# Patient Record
Sex: Female | Born: 1945
Health system: Southern US, Community
[De-identification: ages and names within clinical notes are randomized; demographics above are authoritative.]

## PROBLEM LIST (undated history)

## (undated) DIAGNOSIS — M810 Age-related osteoporosis without current pathological fracture: Secondary | ICD-10-CM

## (undated) DIAGNOSIS — E119 Type 2 diabetes mellitus without complications: Secondary | ICD-10-CM

## (undated) DIAGNOSIS — IMO0001 Reserved for inherently not codable concepts without codable children: Secondary | ICD-10-CM

## (undated) DIAGNOSIS — J209 Acute bronchitis, unspecified: Secondary | ICD-10-CM

## (undated) DIAGNOSIS — E785 Hyperlipidemia, unspecified: Secondary | ICD-10-CM

## (undated) DIAGNOSIS — F419 Anxiety disorder, unspecified: Secondary | ICD-10-CM

## (undated) DIAGNOSIS — R030 Elevated blood-pressure reading, without diagnosis of hypertension: Secondary | ICD-10-CM

## (undated) DIAGNOSIS — R7989 Other specified abnormal findings of blood chemistry: Secondary | ICD-10-CM

## (undated) DIAGNOSIS — M722 Plantar fascial fibromatosis: Secondary | ICD-10-CM

## (undated) DIAGNOSIS — M949 Disorder of cartilage, unspecified: Secondary | ICD-10-CM

## (undated) DIAGNOSIS — M899 Disorder of bone, unspecified: Secondary | ICD-10-CM

## (undated) HISTORY — DX: Disorder of bone, unspecified: M89.9

## (undated) HISTORY — DX: Anxiety disorder, unspecified: F41.9

## (undated) HISTORY — DX: Other specified abnormal findings of blood chemistry: R79.89

## (undated) HISTORY — PX: EYE SURGERY: SHX253

## (undated) HISTORY — DX: Age-related osteoporosis without current pathological fracture: M81.0

## (undated) HISTORY — DX: Disorder of cartilage, unspecified: M94.9

## (undated) HISTORY — DX: Plantar fascial fibromatosis: M72.2

## (undated) HISTORY — DX: Hyperlipidemia, unspecified: E78.5

## (undated) HISTORY — DX: Reserved for inherently not codable concepts without codable children: IMO0001

## (undated) HISTORY — DX: Acute bronchitis, unspecified: J20.9

## (undated) HISTORY — DX: Elevated blood-pressure reading, without diagnosis of hypertension: R03.0

## (undated) HISTORY — DX: Type 2 diabetes mellitus without complications: E11.9

---

## 1995-04-17 HISTORY — PX: BREAST SURGERY: SHX581

## 1999-09-22 ENCOUNTER — Encounter: Payer: Self-pay | Admitting: *Deleted

## 1999-09-22 ENCOUNTER — Encounter: Admission: RE | Admit: 1999-09-22 | Discharge: 1999-09-22 | Payer: Self-pay | Admitting: *Deleted

## 1999-10-26 ENCOUNTER — Other Ambulatory Visit: Admission: RE | Admit: 1999-10-26 | Discharge: 1999-10-26 | Payer: Self-pay | Admitting: *Deleted

## 2000-10-11 ENCOUNTER — Encounter: Payer: Self-pay | Admitting: Obstetrics and Gynecology

## 2000-10-11 ENCOUNTER — Encounter: Admission: RE | Admit: 2000-10-11 | Discharge: 2000-10-11 | Payer: Self-pay | Admitting: Obstetrics and Gynecology

## 2000-11-01 ENCOUNTER — Other Ambulatory Visit: Admission: RE | Admit: 2000-11-01 | Discharge: 2000-11-01 | Payer: Self-pay | Admitting: Obstetrics and Gynecology

## 2001-12-18 ENCOUNTER — Encounter: Admission: RE | Admit: 2001-12-18 | Discharge: 2001-12-18 | Payer: Self-pay | Admitting: *Deleted

## 2001-12-18 ENCOUNTER — Other Ambulatory Visit: Admission: RE | Admit: 2001-12-18 | Discharge: 2001-12-18 | Payer: Self-pay | Admitting: *Deleted

## 2002-12-25 ENCOUNTER — Encounter: Admission: RE | Admit: 2002-12-25 | Discharge: 2002-12-25 | Payer: Self-pay | Admitting: *Deleted

## 2003-02-19 ENCOUNTER — Other Ambulatory Visit: Admission: RE | Admit: 2003-02-19 | Discharge: 2003-02-19 | Payer: Self-pay | Admitting: *Deleted

## 2004-01-13 ENCOUNTER — Ambulatory Visit (HOSPITAL_COMMUNITY): Admission: RE | Admit: 2004-01-13 | Discharge: 2004-01-13 | Payer: Self-pay | Admitting: *Deleted

## 2005-02-01 ENCOUNTER — Ambulatory Visit (HOSPITAL_COMMUNITY): Admission: RE | Admit: 2005-02-01 | Discharge: 2005-02-01 | Payer: Self-pay | Admitting: Family Medicine

## 2006-04-19 ENCOUNTER — Ambulatory Visit (HOSPITAL_COMMUNITY): Admission: RE | Admit: 2006-04-19 | Discharge: 2006-04-19 | Payer: Self-pay | Admitting: *Deleted

## 2007-04-24 ENCOUNTER — Ambulatory Visit (HOSPITAL_COMMUNITY): Admission: RE | Admit: 2007-04-24 | Discharge: 2007-04-24 | Payer: Self-pay | Admitting: Obstetrics and Gynecology

## 2007-12-11 ENCOUNTER — Ambulatory Visit (HOSPITAL_COMMUNITY): Admission: RE | Admit: 2007-12-11 | Discharge: 2007-12-11 | Payer: Self-pay | Admitting: Obstetrics and Gynecology

## 2008-09-16 ENCOUNTER — Emergency Department (HOSPITAL_COMMUNITY): Admission: EM | Admit: 2008-09-16 | Discharge: 2008-09-16 | Payer: Self-pay | Admitting: Emergency Medicine

## 2008-09-19 ENCOUNTER — Emergency Department (HOSPITAL_COMMUNITY): Admission: EM | Admit: 2008-09-19 | Discharge: 2008-09-19 | Payer: Self-pay | Admitting: Emergency Medicine

## 2008-12-23 ENCOUNTER — Ambulatory Visit (HOSPITAL_COMMUNITY): Admission: RE | Admit: 2008-12-23 | Discharge: 2008-12-23 | Payer: Self-pay | Admitting: Internal Medicine

## 2009-09-02 ENCOUNTER — Ambulatory Visit (HOSPITAL_COMMUNITY): Admission: RE | Admit: 2009-09-02 | Discharge: 2009-09-02 | Payer: Self-pay | Admitting: Obstetrics and Gynecology

## 2010-01-20 ENCOUNTER — Ambulatory Visit (HOSPITAL_COMMUNITY): Admission: RE | Admit: 2010-01-20 | Discharge: 2010-01-20 | Payer: Self-pay | Admitting: Obstetrics and Gynecology

## 2010-08-11 ENCOUNTER — Other Ambulatory Visit (HOSPITAL_COMMUNITY): Payer: Self-pay | Admitting: Obstetrics and Gynecology

## 2010-08-11 DIAGNOSIS — Z1231 Encounter for screening mammogram for malignant neoplasm of breast: Secondary | ICD-10-CM

## 2010-09-07 ENCOUNTER — Ambulatory Visit (HOSPITAL_COMMUNITY)
Admission: RE | Admit: 2010-09-07 | Discharge: 2010-09-07 | Disposition: A | Discharge status: Home / Self Care | Payer: BC Managed Care – PPO | Source: Ambulatory Visit | Attending: Obstetrics and Gynecology | Admitting: Obstetrics and Gynecology

## 2010-09-07 DIAGNOSIS — Z1231 Encounter for screening mammogram for malignant neoplasm of breast: Secondary | ICD-10-CM

## 2011-09-03 ENCOUNTER — Other Ambulatory Visit (HOSPITAL_COMMUNITY): Payer: Self-pay | Admitting: Obstetrics and Gynecology

## 2011-09-03 DIAGNOSIS — Z1231 Encounter for screening mammogram for malignant neoplasm of breast: Secondary | ICD-10-CM

## 2011-09-28 ENCOUNTER — Ambulatory Visit (HOSPITAL_COMMUNITY)
Admission: RE | Admit: 2011-09-28 | Discharge: 2011-09-28 | Disposition: A | Discharge status: Home / Self Care | Payer: Medicare Other | Source: Ambulatory Visit | Attending: Obstetrics and Gynecology | Admitting: Obstetrics and Gynecology

## 2011-09-28 DIAGNOSIS — Z1231 Encounter for screening mammogram for malignant neoplasm of breast: Secondary | ICD-10-CM

## 2012-07-24 ENCOUNTER — Ambulatory Visit (INDEPENDENT_AMBULATORY_CARE_PROVIDER_SITE_OTHER): Payer: Medicare Other | Admitting: Nurse Practitioner

## 2012-07-24 ENCOUNTER — Encounter: Payer: Self-pay | Admitting: Geriatric Medicine

## 2012-07-24 ENCOUNTER — Encounter: Payer: Self-pay | Admitting: Nurse Practitioner

## 2012-07-24 VITALS — BP 140/86 | HR 77 | Temp 98.5°F | Resp 16 | Ht 63.0 in | Wt 159.0 lb

## 2012-07-24 DIAGNOSIS — IMO0001 Reserved for inherently not codable concepts without codable children: Secondary | ICD-10-CM

## 2012-07-24 DIAGNOSIS — E119 Type 2 diabetes mellitus without complications: Secondary | ICD-10-CM

## 2012-07-24 DIAGNOSIS — I10 Essential (primary) hypertension: Secondary | ICD-10-CM

## 2012-07-24 DIAGNOSIS — E785 Hyperlipidemia, unspecified: Secondary | ICD-10-CM

## 2012-07-24 DIAGNOSIS — M791 Myalgia, unspecified site: Secondary | ICD-10-CM

## 2012-07-24 MED ORDER — SIMVASTATIN 20 MG PO TABS
20.0000 mg | ORAL_TABLET | Freq: Every evening | ORAL | Status: DC
Start: 2012-07-24 — End: 2013-02-05

## 2012-07-24 MED ORDER — NAPROXEN SODIUM 220 MG PO TABS: 220.0000 mg | ORAL_TABLET | Freq: Two times a day (BID) | ORAL | Status: DC

## 2012-07-24 MED ORDER — METFORMIN HCL 500 MG PO TABS: ORAL_TABLET | ORAL | Status: DC

## 2012-07-24 NOTE — Assessment & Plan Note (Signed)
A1c has improved with diet, exercise and medication. To cont current regimen

## 2012-07-24 NOTE — Assessment & Plan Note (Signed)
Worse after increase exercise. Encouraged good hydration and stretching. She is currently using  biofreeze gel which helps

## 2012-07-24 NOTE — Assessment & Plan Note (Signed)
currently on no medications. Reports sbp is 110s normally when checked at home. Will cont to monitor

## 2012-07-24 NOTE — Assessment & Plan Note (Signed)
Improved on labs- continue current regimen. Will monitor and make changes as necessary.

## 2012-07-24 NOTE — Progress Notes (Signed)
Patient ID: Jasmine James, female   DOB: June 18, 1945, 67 y.o.   MRN: [id number]   Allergies  Allergen Reactions  . Codeine   . Sulfa Antibiotics     Chief Complaint  Patient presents with  . Medical Managment of Chronic Issues    HPI: Patient is a 67 y.o. female seen in the office today for routine follow up   had bone density done less than 1 month ago [facility]- reports it was better than previous  Reports leg cramps/muscle pains after exercise at night  250.00-DM, UNCOMPLICATED TYPE II  hba1c has improved. 272.4-HYPERLIPIDEMIA  lipids improved with statin, diet, exercise- she has cut out soda, bacon, and chips- now she does not even want that 7733.00-OSTEOPOROSIS  on calcium w/ D occassionally and followed by ob-gyn  Review of Systems:  Review of Systems  Constitutional: Negative.   HENT: Negative.   Respiratory: Negative.   Cardiovascular: Negative.   Gastrointestinal: Negative.   Genitourinary: Negative.   Musculoskeletal: Positive for myalgias (at night after she walks).  Skin: Negative.   Neurological: Negative.   Psychiatric/Behavioral: Negative.     Past Medical History  Diagnosis Date  . Acute bronchitis   . Type II or unspecified type diabetes mellitus without mention of complication, not stated as uncontrolled   . Other abnormal blood chemistry   . Plantar fascial fibromatosis   . Myalgia and myositis, unspecified   . Other and unspecified hyperlipidemia   . Osteoporosis, unspecified   . Elevated blood pressure reading without diagnosis of hypertension   . Disorder of bone and cartilage, unspecified    Past Surgical History  Procedure Laterality Date  . Breast surgery  1997    reduction   Social History:   reports that she has been smoking Cigarettes.  She has been smoking about 0.00 packs per day. She does not have any smokeless tobacco history on file. She reports that  drinks alcohol. She reports that she does not use illicit  drugs.  No family history on file.  Medications: Patient's Medications  New Prescriptions   No medications on file  Previous Medications   No medications on file  Modified Medications   Modified Medication Previous Medication   METFORMIN (GLUCOPHAGE) 500 MG TABLET metFORMIN (GLUCOPHAGE) 500 MG tablet      Take one half tablet by mouth every other day with breakfast.    Take 500 mg by mouth 2 (two) times daily with a meal. Take one half tablet by mouth every other day with breakfast.   NAPROXEN SODIUM (ANAPROX) 220 MG TABLET naproxen sodium (ANAPROX) 220 MG tablet      Take 1 tablet (220 mg total) by mouth 2 (two) times daily with a meal. Take one tablet once daily as needed for pain    2 (two) times daily with a meal. Take one tablet once daily as needed for pain   SIMVASTATIN (ZOCOR) 20 MG TABLET simvastatin (ZOCOR) 20 MG tablet      Take 1 tablet (20 mg total) by mouth every evening.    Take 20 mg by mouth every evening.  Discontinued Medications   ASPIRIN 81 MG TABLET    Take 81 mg by mouth daily.     Physical Exam: Physical Exam  Constitutional: She is oriented to person, place, and time. She appears well-developed and well-nourished. No distress.  HENT:  Head: Normocephalic and atraumatic.  Eyes: Conjunctivae and EOM are normal. Pupils are equal, round, and reactive to light.  Neck: Normal range of motion. Neck supple.  Cardiovascular: Normal rate and regular rhythm.   Pulmonary/Chest: Effort normal and breath sounds normal.  Abdominal: Soft. Bowel sounds are normal.  Musculoskeletal: Normal range of motion.  Neurological: She is alert and oriented to person, place, and time.  Skin: Skin is warm and dry. She is not diaphoretic.  Psychiatric: She has a normal mood and affect.     Filed Vitals:   07/24/12 0824  BP: 140/86  Pulse: 77  Temp: 98.5 F (36.9 C)  TempSrc: Oral  Resp: 16  Height: 5\' 3"  (1.6 m)  Weight: 159 lb (72.122 kg)       Assessment/Plan Unspecified essential hypertension currently on no medications. Reports sbp is 110s normally when checked at home. Will cont to monitor  Diabetes A1c has improved with diet, exercise and medication. To cont current regimen   Muscle pain  Worse after increase exercise. Encouraged good hydration and stretching. She is currently using  biofreeze gel which helps   Other and unspecified hyperlipidemia Improved on labs- continue current regimen. Will monitor and make changes as necessary.

## 2013-02-05 ENCOUNTER — Other Ambulatory Visit: Payer: Self-pay | Admitting: *Deleted

## 2013-02-05 DIAGNOSIS — E785 Hyperlipidemia, unspecified: Secondary | ICD-10-CM

## 2013-02-05 MED ORDER — SIMVASTATIN 20 MG PO TABS: 20.0000 mg | ORAL_TABLET | Freq: Every evening | ORAL | Status: DC

## 2013-04-16 HISTORY — PX: CATARACT EXTRACTION: SUR2

## 2013-06-16 ENCOUNTER — Other Ambulatory Visit: Payer: Self-pay | Admitting: *Deleted

## 2013-06-16 DIAGNOSIS — E119 Type 2 diabetes mellitus without complications: Secondary | ICD-10-CM

## 2013-06-16 MED ORDER — METFORMIN HCL 500 MG PO TABS: ORAL_TABLET | ORAL | Status: DC

## 2013-07-29 NOTE — Progress Notes (Signed)
LMOM to return call to schedule an appointment. °

## 2013-09-23 ENCOUNTER — Telehealth: Payer: Self-pay

## 2013-09-23 DIAGNOSIS — E785 Hyperlipidemia, unspecified: Secondary | ICD-10-CM

## 2013-09-23 NOTE — Telephone Encounter (Signed)
Medication refill request came over via manual fax from [facility] for Simvastatin. Patient last seen greater than 1 year ago, ? If patient intends on continuing care with Janett Billow (NP), if yes patient needs to schedule an appointment and medication can be filled for 1 month

## 2013-09-25 MED ORDER — SIMVASTATIN 20 MG PO TABS: 20.0000 mg | ORAL_TABLET | Freq: Every evening | ORAL | Status: DC

## 2013-09-25 NOTE — Telephone Encounter (Signed)
Patient called back indicating she will keep pending appointment for 10/2013.

## 2013-10-22 ENCOUNTER — Encounter: Payer: Self-pay | Admitting: Nurse Practitioner

## 2013-10-22 ENCOUNTER — Ambulatory Visit (INDEPENDENT_AMBULATORY_CARE_PROVIDER_SITE_OTHER): Payer: Medicare Other | Admitting: Nurse Practitioner

## 2013-10-22 VITALS — BP 160/90 | HR 80 | Temp 98.1°F | Wt 157.0 lb

## 2013-10-22 DIAGNOSIS — M8580 Other specified disorders of bone density and structure, unspecified site: Secondary | ICD-10-CM

## 2013-10-22 DIAGNOSIS — E119 Type 2 diabetes mellitus without complications: Secondary | ICD-10-CM

## 2013-10-22 DIAGNOSIS — Z23 Encounter for immunization: Secondary | ICD-10-CM

## 2013-10-22 DIAGNOSIS — E785 Hyperlipidemia, unspecified: Secondary | ICD-10-CM

## 2013-10-22 DIAGNOSIS — I10 Essential (primary) hypertension: Secondary | ICD-10-CM

## 2013-10-22 DIAGNOSIS — M949 Disorder of cartilage, unspecified: Secondary | ICD-10-CM

## 2013-10-22 DIAGNOSIS — M899 Disorder of bone, unspecified: Secondary | ICD-10-CM

## 2013-10-22 NOTE — Patient Instructions (Signed)
To come in tomorrow morning fasting for lab work, after this results we will send in your prescriptions  Health Maintenance, Female A healthy lifestyle and preventative care can promote health and wellness.  Maintain regular health, dental, and eye exams.  Eat a healthy diet. Foods like vegetables, fruits, whole grains, low-fat dairy products, and lean protein foods contain the nutrients you need without too many calories. Decrease your intake of foods high in solid fats, added sugars, and salt. Get information about a proper diet from your caregiver, if necessary.  Regular physical exercise is one of the most important things you can do for your health. Most adults should get at least 150 minutes of moderate-intensity exercise (any activity that increases your heart rate and causes you to sweat) each week. In addition, most adults need muscle-strengthening exercises on 2 or more days a week.   Maintain a healthy weight. The body mass index (BMI) is a screening tool to identify possible weight problems. It provides an estimate of body fat based on height and weight. Your caregiver can help determine your BMI, and can help you achieve or maintain a healthy weight. For adults 20 years and older:  A BMI below 18.5 is considered underweight.  A BMI of 18.5 to 24.9 is normal.  A BMI of 25 to 29.9 is considered overweight.  A BMI of 30 and above is considered obese.  Maintain normal blood lipids and cholesterol by exercising and minimizing your intake of saturated fat. Eat a balanced diet with plenty of fruits and vegetables. Blood tests for lipids and cholesterol should begin at age 53 and be repeated every 5 years. If your lipid or cholesterol levels are high, you are over 50, or you are a high risk for heart disease, you may need your cholesterol levels checked more frequently.Ongoing high lipid and cholesterol levels should be treated with medicines if diet and exercise are not effective.  If  you smoke, find out from your caregiver how to quit. If you do not use tobacco, do not start.  Lung cancer screening is recommended for adults aged 70-80 years who are at high risk for developing lung cancer because of a history of smoking. Yearly low-dose computed tomography (CT) is recommended for people who have at least a 30-pack-year history of smoking and are a current smoker or have quit within the past 15 years. A pack year of smoking is smoking an average of 1 pack of cigarettes a day for 1 year (for example: 1 pack a day for 30 years or 2 packs a day for 15 years). Yearly screening should continue until the smoker has stopped smoking for at least 15 years. Yearly screening should also be stopped for people who develop a health problem that would prevent them from having lung cancer treatment.  If you are pregnant, do not drink alcohol. If you are breastfeeding, be very cautious about drinking alcohol. If you are not pregnant and choose to drink alcohol, do not exceed 1 drink per day. One drink is considered to be 12 ounces (355 mL) of beer, 5 ounces (148 mL) of wine, or 1.5 ounces (44 mL) of liquor.  Avoid use of street drugs. Do not share needles with anyone. Ask for help if you need support or instructions about stopping the use of drugs.  High blood pressure causes heart disease and increases the risk of stroke. Blood pressure should be checked at least every 1 to 2 years. Ongoing high blood pressure should  be treated with medicines, if weight loss and exercise are not effective.  If you are 83 to 68 years old, ask your caregiver if you should take aspirin to prevent strokes.  Diabetes screening involves taking a blood sample to check your fasting blood sugar level. This should be done once every 3 years, after age 20, if you are within normal weight and without risk factors for diabetes. Testing should be considered at a younger age or be carried out more frequently if you are overweight  and have at least 1 risk factor for diabetes.  Breast cancer screening is essential preventative care for women. You should practice "breast self-awareness." This means understanding the normal appearance and feel of your breasts and may include breast self-examination. Any changes detected, no matter how small, should be reported to a caregiver. Women in their 1s and 30s should have a clinical breast exam (CBE) by a caregiver as part of a regular health exam every 1 to 3 years. After age 13, women should have a CBE every year. Starting at age 90, women should consider having a mammogram (breast X-ray) every year. Women who have a family history of breast cancer should talk to their caregiver about genetic screening. Women at a high risk of breast cancer should talk to their caregiver about having an MRI and a mammogram every year.  Breast cancer gene (BRCA)-related cancer risk assessment is recommended for women who have family members with BRCA-related cancers. BRCA-related cancers include breast, ovarian, tubal, and peritoneal cancers. Having family members with these cancers may be associated with an increased risk for harmful changes (mutations) in the breast cancer genes BRCA1 and BRCA2. Results of the assessment will determine the need for genetic counseling and BRCA1 and BRCA2 testing.  The Pap test is a screening test for cervical cancer. Women should have a Pap test starting at age 48. Between ages 35 and 55, Pap tests should be repeated every 2 years. Beginning at age 7, you should have a Pap test every 3 years as long as the past 3 Pap tests have been normal. If you had a hysterectomy for a problem that was not cancer or a condition that could lead to cancer, then you no longer need Pap tests. If you are between ages 4 and 85, and you have had normal Pap tests going back 10 years, you no longer need Pap tests. If you have had past treatment for cervical cancer or a condition that could lead to  cancer, you need Pap tests and screening for cancer for at least 20 years after your treatment. If Pap tests have been discontinued, risk factors (such as a new sexual partner) need to be reassessed to determine if screening should be resumed. Some women have medical problems that increase the chance of getting cervical cancer. In these cases, your caregiver may recommend more frequent screening and Pap tests.  The human papillomavirus (HPV) test is an additional test that may be used for cervical cancer screening. The HPV test looks for the virus that can cause the cell changes on the cervix. The cells collected during the Pap test can be tested for HPV. The HPV test could be used to screen women aged 73 years and older, and should be used in women of any age who have unclear Pap test results. After the age of 73, women should have HPV testing at the same frequency as a Pap test.  Colorectal cancer can be detected and often prevented. Most  routine colorectal cancer screening begins at the age of 35 and continues through age 58. However, your caregiver may recommend screening at an earlier age if you have risk factors for colon cancer. On a yearly basis, your caregiver may provide home test kits to check for hidden blood in the stool. Use of a small camera at the end of a tube, to directly examine the colon (sigmoidoscopy or colonoscopy), can detect the earliest forms of colorectal cancer. Talk to your caregiver about this at age 6, when routine screening begins. Direct examination of the colon should be repeated every 5 to 10 years through age 67, unless early forms of pre-cancerous polyps or small growths are found.  Hepatitis C blood testing is recommended for all people born from [birth year] through 1965 and any individual with known risks for hepatitis C.  Practice safe sex. Use condoms and avoid high-risk sexual practices to reduce the spread of sexually transmitted infections (STIs). Sexually active women  aged 62 and younger should be checked for Chlamydia, which is a common sexually transmitted infection. Older women with new or multiple partners should also be tested for Chlamydia. Testing for other STIs is recommended if you are sexually active and at increased risk.  Osteoporosis is a disease in which the bones lose minerals and strength with aging. This can result in serious bone fractures. The risk of osteoporosis can be identified using a bone density scan. Women ages 26 and over and women at risk for fractures or osteoporosis should discuss screening with their caregivers. Ask your caregiver whether you should be taking a calcium supplement or vitamin D to reduce the rate of osteoporosis.  Menopause can be associated with physical symptoms and risks. Hormone replacement therapy is available to decrease symptoms and risks. You should talk to your caregiver about whether hormone replacement therapy is right for you.  Use sunscreen. Apply sunscreen liberally and repeatedly throughout the day. You should seek shade when your shadow is shorter than you. Protect yourself by wearing long sleeves, pants, a wide-brimmed hat, and sunglasses year round, whenever you are outdoors.  Notify your caregiver of new moles or changes in moles, especially if there is a change in shape or color. Also notify your caregiver if a mole is larger than the size of a pencil eraser.  Stay current with your immunizations. Document Released: 10/16/2010 Document Revised: 07/28/2012 Document Reviewed: 03/04/2013 [facility] Patient Information 2015 [place], [place]. This information is not intended to replace advice given to you by your health care provider. Make sure you discuss any questions you have with your health care provider.

## 2013-10-22 NOTE — Progress Notes (Signed)
Patient ID: Jasmine James, female   DOB: 11-01-1945, 68 y.o.   MRN: [id number]    Allergies  Allergen Reactions  . Codeine   . Sulfa Antibiotics     Chief Complaint  Patient presents with  . Medical Management of Chronic Issues    f/u & medication refills  . Immunizations    declines Pneumo & shingles vaccines  . other    colonoscopy done 2008    HPI: Patient is a 68 y.o. female seen in the office today for routine follow up Checked blood pressures at home occasionally reported in was 134/74, 120s/80s at home  Does not take blood sugar, takes 1 tablet daily, Rx is for 1/2 tablet  Simvastatin 20 mg daily tolerating well  Reports she is walking 2 hours every Sunday and 1 mile every Thursday and Friday; also dances  eating small portions   Had bone density in the last year P[facility] done by [facility] and GYN reviewed bone density reported osteopenia, on calicum and vit d for this. No complaints at this time  Review of Systems:  Review of Systems  Constitutional: Negative.  Negative for weight loss and malaise/fatigue.  HENT: Negative.   Eyes: Negative for blurred vision.  Respiratory: Negative.  Negative for cough and shortness of breath.   Cardiovascular: Negative.  Negative for chest pain and leg swelling.  Gastrointestinal: Negative.  Negative for heartburn, nausea, vomiting, abdominal pain, diarrhea and constipation.  Genitourinary: Negative.  Negative for dysuria, urgency and frequency.  Musculoskeletal: Negative.  Negative for joint pain and myalgias.  Skin: Negative.  Negative for itching and rash.  Neurological: Negative.  Negative for dizziness, sensory change, weakness and headaches.  Psychiatric/Behavioral: Negative.  Negative for depression. The patient is not nervous/anxious and does not have insomnia.      Past Medical History  Diagnosis Date  . Acute bronchitis   . Type II or unspecified type diabetes mellitus without mention of complication, not  stated as uncontrolled   . Other abnormal blood chemistry   . Plantar fascial fibromatosis   . Myalgia and myositis, unspecified   . Other and unspecified hyperlipidemia   . Osteoporosis, unspecified   . Elevated blood pressure reading without diagnosis of hypertension   . Disorder of bone and cartilage, unspecified    Past Surgical History  Procedure Laterality Date  . Breast surgery  1997    reduction   Social History:   reports that she has never smoked. She has never used smokeless tobacco. She reports that she drinks alcohol. She reports that she does not use illicit drugs.  History reviewed. No pertinent family history.  Medications: Patient's Medications  New Prescriptions   No medications on file  Previous Medications   METFORMIN (GLUCOPHAGE) 500 MG TABLET    Take one half tablet by mouth every other day with breakfast.   SIMVASTATIN (ZOCOR) 20 MG TABLET    Take 1 tablet (20 mg total) by mouth every evening.  Modified Medications   No medications on file  Discontinued Medications   NAPROXEN SODIUM (ANAPROX) 220 MG TABLET    Take 1 tablet (220 mg total) by mouth 2 (two) times daily with a meal. Take one tablet once daily as needed for pain   SIMVASTATIN (ZOCOR) 20 MG TABLET    Take 1 tablet (20 mg total) by mouth every evening.     Physical Exam:  Filed Vitals:   10/22/13 0859  BP: 160/90  Pulse: 80  Temp: 98.1  F (36.7 C)  TempSrc: Oral  Weight: 157 lb (71.215 kg)  SpO2: 98%    Physical Exam  Constitutional: She is oriented to person, place, and time and well-developed, well-nourished, and in no distress.  HENT:  Mouth/Throat: Oropharynx is clear and moist. No oropharyngeal exudate.  Eyes: Conjunctivae and EOM are normal. Pupils are equal, round, and reactive to light.  Neck: Normal range of motion. Neck supple. No thyromegaly present.  Cardiovascular: Normal rate, regular rhythm, normal heart sounds and intact distal pulses.   Pulmonary/Chest: Effort  normal and breath sounds normal. No respiratory distress.  Abdominal: Soft. Bowel sounds are normal. She exhibits no distension.  Musculoskeletal: Normal range of motion. She exhibits no edema and no tenderness.  Lymphadenopathy:    She has no cervical adenopathy.  Neurological: She is alert and oriented to person, place, and time.  Skin: Skin is warm and dry.  Psychiatric: Affect normal.    Labs reviewed: Basic Metabolic Panel: No results found for this basename: NA, K, CL, CO2, GLUCOSE, BUN, CREATININE, CALCIUM, MG, PHOS, TSH,  in the last 8760 hours Liver Function Tests: No results found for this basename: AST, ALT, ALKPHOS, BILITOT, PROT, ALBUMIN,  in the last 8760 hours No results found for this basename: LIPASE, AMYLASE,  in the last 8760 hours No results found for this basename: AMMONIA,  in the last 8760 hours CBC: No results found for this basename: WBC, NEUTROABS, HGB, HCT, MCV, PLT,  in the last 8760 hours Lipid Panel: No results found for this basename: CHOL, HDL, LDLCALC, TRIG, CHOLHDL, LDLDIRECT,  in the last 8760 hours TSH: No results found for this basename: TSH,  in the last 8760 hours A1C: No results found for this basename: HGBA1C     Assessment/Plan  1. Other and unspecified hyperlipidemia -cont current medications and lifestyle modification  - Lipid panel  2. Type 2 diabetes mellitus without complication - Comprehensive metabolic panel - CBC With differential/Platelet - Hemoglobin A1c - Microalbumin, urine -cont Metformin 1 tablet daily for diabetes, may need to change based on A1c -consider starting ACE for kidney protection and hypertension, will get labs before   3. Unspecified essential hypertension -to follow up blood pressure at home, follow up in 2 weeks for blood pressure check  4. Need for prophylactic vaccination against Streptococcus pneumoniae (pneumococcus) - Pneumococcal conjugate vaccine 13-valent given today  5. Osteopenia - cont  calcium and vit D daily  -bone density scans per GYN

## 2013-10-23 ENCOUNTER — Other Ambulatory Visit: Payer: Medicare Other

## 2013-10-23 LAB — MICROALBUMIN, URINE: MICROALBUM., U, RANDOM: 8.8 ug/mL (ref 0.0–17.0)

## 2013-10-24 LAB — COMPREHENSIVE METABOLIC PANEL
ALT: 28 IU/L (ref 0–32)
AST: 26 IU/L (ref 0–40)
Albumin/Globulin Ratio: 1.9 (ref 1.1–2.5)
Albumin: 4.6 g/dL (ref 3.6–4.8)
Alkaline Phosphatase: 81 IU/L (ref 39–117)
BUN/Creatinine Ratio: 29 — ABNORMAL HIGH (ref 11–26)
BUN: 22 mg/dL (ref 8–27)
CALCIUM: 9.4 mg/dL (ref 8.7–10.3)
CO2: 21 mmol/L (ref 18–29)
Chloride: 104 mmol/L (ref 97–108)
Creatinine, Ser: 0.76 mg/dL (ref 0.57–1.00)
GFR calc Af Amer: 94 mL/min/{1.73_m2} (ref >59)
GFR, EST NON AFRICAN AMERICAN: 81 mL/min/{1.73_m2} (ref >59)
GLOBULIN, TOTAL: 2.4 g/dL (ref 1.5–4.5)
Glucose: 102 mg/dL — ABNORMAL HIGH (ref 65–99)
POTASSIUM: 4.5 mmol/L (ref 3.5–5.2)
Sodium: 141 mmol/L (ref 134–144)
TOTAL PROTEIN: 7 g/dL (ref 6.0–8.5)
Total Bilirubin: 0.4 mg/dL (ref 0.0–1.2)

## 2013-10-24 LAB — LIPID PANEL
CHOL/HDL RATIO: 3.3 ratio (ref 0.0–4.4)
CHOLESTEROL TOTAL: 140 mg/dL (ref 100–199)
HDL: 42 mg/dL (ref >39)
LDL Calculated: 82 mg/dL (ref 0–99)
TRIGLYCERIDES: 78 mg/dL (ref 0–149)
VLDL CHOLESTEROL CAL: 16 mg/dL (ref 5–40)

## 2013-10-24 LAB — CBC WITH DIFFERENTIAL
Basophils Absolute: 0 10*3/uL (ref 0.0–0.2)
Basos: 1 %
EOS ABS: 0.1 10*3/uL (ref 0.0–0.4)
EOS: 2 %
HEMATOCRIT: 40.7 % (ref 34.0–46.6)
HEMOGLOBIN: 13.3 g/dL (ref 11.1–15.9)
IMMATURE GRANULOCYTES: 0 %
Immature Grans (Abs): 0 10*3/uL (ref 0.0–0.1)
Lymphocytes Absolute: 3.1 10*3/uL (ref 0.7–3.1)
Lymphs: 43 %
MCH: 28.7 pg (ref 26.6–33.0)
MCHC: 32.7 g/dL (ref 31.5–35.7)
MCV: 88 fL (ref 79–97)
MONOCYTES: 10 %
Monocytes Absolute: 0.7 10*3/uL (ref 0.1–0.9)
Neutrophils Absolute: 3.2 10*3/uL (ref 1.4–7.0)
Neutrophils Relative %: 44 %
Platelets: 274 10*3/uL (ref 150–379)
RBC: 4.63 x10E6/uL (ref 3.77–5.28)
RDW: 14.2 % (ref 12.3–15.4)
WBC: 7.2 10*3/uL (ref 3.4–10.8)

## 2013-10-24 LAB — HEMOGLOBIN A1C
Est. average glucose Bld gHb Est-mCnc: 123 mg/dL
HEMOGLOBIN A1C: 5.9 % — AB (ref 4.8–5.6)

## 2013-10-25 DIAGNOSIS — M8580 Other specified disorders of bone density and structure, unspecified site: Secondary | ICD-10-CM | POA: Insufficient documentation

## 2013-10-25 MED ORDER — METFORMIN HCL 500 MG PO TABS: 500.0000 mg | ORAL_TABLET | Freq: Every day | ORAL | Status: DC

## 2013-10-26 ENCOUNTER — Other Ambulatory Visit: Payer: Self-pay | Admitting: *Deleted

## 2013-10-26 DIAGNOSIS — E785 Hyperlipidemia, unspecified: Secondary | ICD-10-CM

## 2013-10-26 DIAGNOSIS — E119 Type 2 diabetes mellitus without complications: Secondary | ICD-10-CM

## 2013-10-26 MED ORDER — SIMVASTATIN 20 MG PO TABS: 20.0000 mg | ORAL_TABLET | Freq: Every evening | ORAL | Status: DC

## 2013-10-26 MED ORDER — SIMVASTATIN 20 MG PO TABS: ORAL_TABLET | ORAL | Status: DC

## 2013-10-26 MED ORDER — METFORMIN HCL 500 MG PO TABS: 500.0000 mg | ORAL_TABLET | Freq: Every day | ORAL | Status: DC

## 2013-10-26 NOTE — Telephone Encounter (Signed)
rx sent to the pharmacy due to refills not going on appt day.

## 2013-11-05 ENCOUNTER — Encounter: Payer: Self-pay | Admitting: Nurse Practitioner

## 2013-11-05 ENCOUNTER — Ambulatory Visit (INDEPENDENT_AMBULATORY_CARE_PROVIDER_SITE_OTHER): Payer: Medicare Other | Admitting: Nurse Practitioner

## 2013-11-05 VITALS — BP 138/88 | HR 78 | Temp 98.4°F | Resp 18 | Ht 63.0 in | Wt 158.0 lb

## 2013-11-05 DIAGNOSIS — I10 Essential (primary) hypertension: Secondary | ICD-10-CM

## 2013-11-05 DIAGNOSIS — E119 Type 2 diabetes mellitus without complications: Secondary | ICD-10-CM

## 2013-11-05 MED ORDER — LISINOPRIL 5 MG PO TABS: 5.0000 mg | ORAL_TABLET | Freq: Every day | ORAL | Status: DC

## 2013-11-05 NOTE — Patient Instructions (Addendum)
Will start on lisinopril to protect your kidneys Goal bp is <140/90  Lisinopril tablets What is this medicine? LISINOPRIL (lyse IN [place] pril) is an ACE inhibitor. This medicine is used to treat high blood pressure and heart failure. It is also used to protect the heart immediately after a heart attack. This medicine may be used for other purposes; ask your health care provider or pharmacist if you have questions. COMMON BRAND NAME(S): Prinivil, Zestril What should I tell my health care provider before I take this medicine? They need to know if you have any of these conditions: -diabetes -heart or blood vessel disease -immune system disease like lupus or scleroderma -kidney disease -low blood pressure -previous swelling of the tongue, face, or lips with difficulty breathing, difficulty swallowing, hoarseness, or tightening of the throat -an unusual or allergic reaction to lisinopril, other ACE inhibitors, insect venom, foods, dyes, or preservatives -pregnant or trying to get pregnant -breast-feeding How should I use this medicine? Take this medicine by mouth with a glass of water. Follow the directions on your prescription label. You may take this medicine with or without food. Take your medicine at regular intervals. Do not stop taking this medicine except on the advice of your doctor or health care professional. Talk to your pediatrician regarding the use of this medicine in children. Special care may be needed. While this drug may be prescribed for children as young as 47 years of age for selected conditions, precautions do apply. Overdosage: If you think you have taken too much of this medicine contact a poison control center or emergency room at once. NOTE: This medicine is only for you. Do not share this medicine with others. What if I miss a dose? If you miss a dose, take it as soon as you can. If it is almost time for your next dose, take only that dose. Do not take double or extra  doses. What may interact with this medicine? -diuretics -lithium -NSAIDs, medicines for pain and inflammation, like ibuprofen or naproxen -over-the-counter herbal supplements like hawthorn -potassium salts or potassium supplements -salt substitutes This list may not describe all possible interactions. Give your health care provider a list of all the medicines, herbs, non-prescription drugs, or dietary supplements you use. Also tell them if you smoke, drink alcohol, or use illegal drugs. Some items may interact with your medicine. What should I watch for while using this medicine? Visit your doctor or health care professional for regular check ups. Check your blood pressure as directed. Ask your doctor what your blood pressure should be, and when you should contact him or her. Call your doctor or health care professional if you notice an irregular or fast heart beat. Women should inform their doctor if they wish to become pregnant or think they might be pregnant. There is a potential for serious side effects to an unborn child. Talk to your health care professional or pharmacist for more information. Check with your doctor or health care professional if you get an attack of severe diarrhea, nausea and vomiting, or if you sweat a lot. The loss of too much body fluid can make it dangerous for you to take this medicine. You may get drowsy or dizzy. Do not drive, use machinery, or do anything that needs mental alertness until you know how this drug affects you. Do not stand or sit up quickly, especially if you are an older patient. This reduces the risk of dizzy or fainting spells. Alcohol can make you more  drowsy and dizzy. Avoid alcoholic drinks. Avoid salt substitutes unless you are told otherwise by your doctor or health care professional. Do not treat yourself for coughs, colds, or pain while you are taking this medicine without asking your doctor or health care professional for advice. Some  ingredients may increase your blood pressure. What side effects may I notice from receiving this medicine? Side effects that you should report to your doctor or health care professional as soon as possible: -abdominal pain with or without nausea or vomiting -allergic reactions like skin rash or hives, swelling of the hands, feet, face, lips, throat, or tongue -dark urine -difficulty breathing -dizzy, lightheaded or fainting spell -fever or sore throat -irregular heart beat, chest pain -pain or difficulty passing urine -redness, blistering, peeling or loosening of the skin, including inside the mouth -unusually weak -yellowing of the eyes or skin Side effects that usually do not require medical attention (report to your doctor or health care professional if they continue or are bothersome): -change in taste -cough -decreased sexual function or desire -headache -sun sensitivity -tiredness This list may not describe all possible side effects. Call your doctor for medical advice about side effects. You may report side effects to FDA at 1-800-FDA-1088. Where should I keep my medicine? Keep out of the reach of children. Store at room temperature between 15 and 30 degrees C (59 and 86 degrees F). Protect from moisture. Keep container tightly closed. Throw away any unused medicine after the expiration date. NOTE: This sheet is a summary. It may not cover all possible information. If you have questions about this medicine, talk to your doctor, pharmacist, or health care provider.  2015, Elsevier/[employer] Standard. (2007-10-06 17:36:32)

## 2013-11-09 NOTE — Progress Notes (Signed)
Patient ID: Jasmine James, female   DOB: Oct 14, 1945, 68 y.o.   MRN: [id number]    Allergies  Allergen Reactions  . Codeine   . Sulfa Antibiotics     Chief Complaint  Patient presents with  . Follow-up    BP check    HPI: Patient is a 68 y.o. female seen in the office today for blood pressure review  Blood pressure from home log range from 116-138/66-85  Review of Systems:  Review of Systems  Constitutional: Negative for weight loss.  Eyes: Negative for blurred vision.  Respiratory: Negative for cough and shortness of breath.   Cardiovascular: Negative for chest pain and leg swelling.  Gastrointestinal: Negative.  Negative for heartburn, diarrhea and constipation.  Neurological: Negative for tingling, sensory change, weakness and headaches.  Endo/Heme/Allergies: Negative for polydipsia.     Past Medical History  Diagnosis Date  . Acute bronchitis   . Type II or unspecified type diabetes mellitus without mention of complication, not stated as uncontrolled   . Other abnormal blood chemistry   . Plantar fascial fibromatosis   . Myalgia and myositis, unspecified   . Other and unspecified hyperlipidemia   . Osteoporosis, unspecified   . Elevated blood pressure reading without diagnosis of hypertension   . Disorder of bone and cartilage, unspecified    Past Surgical History  Procedure Laterality Date  . Breast surgery  1997    reduction   Social History:   reports that she has never smoked. She has never used smokeless tobacco. She reports that she drinks alcohol. She reports that she does not use illicit drugs.  No family history on file.  Medications: Patient's Medications  New Prescriptions   LISINOPRIL (PRINIVIL,ZESTRIL) 5 MG TABLET    Take 1 tablet (5 mg total) by mouth daily.  Previous Medications   METFORMIN (GLUCOPHAGE) 500 MG TABLET    Take 1 tablet (500 mg total) by mouth daily with breakfast.   SIMVASTATIN (ZOCOR) 20 MG TABLET    Take one tablet by mouth  every evening to lower cholesterol  Modified Medications   No medications on file  Discontinued Medications   No medications on file     Physical Exam:  Filed Vitals:   11/05/13 1000 11/05/13 1011  BP: 160/110 138/88  Pulse: 78   Temp: 98.4 F (36.9 C)   TempSrc: Oral   Resp: 18   Height: 5\' 3"  (1.6 m)   Weight: 158 lb (71.668 kg)   SpO2: 97%     Physical Exam  Constitutional: She is oriented to person, place, and time and well-developed, well-nourished, and in no distress.  Cardiovascular: Normal rate, regular rhythm, normal heart sounds and intact distal pulses.   Pulmonary/Chest: Effort normal and breath sounds normal. No respiratory distress.  Abdominal: Soft. Bowel sounds are normal.  Neurological: She is alert and oriented to person, place, and time.  Skin: Skin is warm and dry.  Psychiatric: Affect normal.    Labs reviewed: Basic Metabolic Panel:  Recent Labs  10/23/13 0806  NA 141  K 4.5  CL 104  CO2 21  GLUCOSE 102*  BUN 22  CREATININE 0.76  CALCIUM 9.4   Liver Function Tests:  Recent Labs  10/23/13 0806  AST 26  ALT 28  ALKPHOS 81  BILITOT 0.4  PROT 7.0   No results found for this basename: LIPASE, AMYLASE,  in the last 8760 hours No results found for this basename: AMMONIA,  in the last 8760 hours CBC:  Recent Labs  10/23/13 0806  WBC 7.2  NEUTROABS 3.2  HGB 13.3  HCT 40.7  MCV 88  PLT 274   Lipid Panel:  Recent Labs  10/23/13 0806  HDL 42  LDLCALC 82  TRIG 78  CHOLHDL 3.3   TSH: No results found for this basename: TSH,  in the last 8760 hours A1C: Lab Results  Component Value Date   HGBA1C 5.9* 10/23/2013     Assessment/Plan  1. Type 2 diabetes mellitus without complication -will add ACE inhibitor for renal protection - lisinopril (PRINIVIL,ZESTRIL) 5 MG tablet; Take 1 tablet (5 mg total) by mouth daily.  Dispense: 30 tablet; Refill: 6 discussed risk vs benefits of lisinopril, pt very reluctant to take any  medications, husband does not support taking medications -information also provided -to call and inform us of any side effects or if not able to tolerate - Basic metabolic panel; Future  2. Unspecified essential hypertension - stable at this time, will add lisinopril for diabetic benefit -blood pressure will tolerate dose -goal for blood pressure to be less than 140/90

## 2013-12-24 LAB — HM DIABETES EYE EXAM

## 2014-02-04 ENCOUNTER — Other Ambulatory Visit: Payer: Medicare Other

## 2014-02-04 DIAGNOSIS — E119 Type 2 diabetes mellitus without complications: Secondary | ICD-10-CM

## 2014-02-05 LAB — BASIC METABOLIC PANEL
BUN/Creatinine Ratio: 23 (ref 11–26)
BUN: 18 mg/dL (ref 8–27)
CALCIUM: 9.7 mg/dL (ref 8.7–10.3)
CO2: 23 mmol/L (ref 18–29)
CREATININE: 0.8 mg/dL (ref 0.57–1.00)
Chloride: 102 mmol/L (ref 97–108)
GFR calc Af Amer: 88 mL/min/{1.73_m2} (ref >59)
GFR calc non Af Amer: 77 mL/min/{1.73_m2} (ref >59)
Glucose: 102 mg/dL — ABNORMAL HIGH (ref 65–99)
Potassium: 4.1 mmol/L (ref 3.5–5.2)
Sodium: 141 mmol/L (ref 134–144)

## 2014-03-03 ENCOUNTER — Ambulatory Visit: Payer: Medicare Other | Admitting: Nurse Practitioner

## 2014-03-04 ENCOUNTER — Encounter: Payer: Self-pay | Admitting: Nurse Practitioner

## 2014-03-04 ENCOUNTER — Ambulatory Visit (INDEPENDENT_AMBULATORY_CARE_PROVIDER_SITE_OTHER): Payer: Medicare Other | Admitting: Nurse Practitioner

## 2014-03-04 VITALS — BP 120/80 | HR 74 | Temp 98.3°F | Ht 63.0 in | Wt 153.2 lb

## 2014-03-04 DIAGNOSIS — T300 Burn of unspecified body region, unspecified degree: Secondary | ICD-10-CM

## 2014-03-04 MED ORDER — MUPIROCIN 2 % EX OINT: 1.0000 "application " | TOPICAL_OINTMENT | Freq: Two times a day (BID) | CUTANEOUS | Status: DC

## 2014-03-04 NOTE — Progress Notes (Signed)
Patient ID: Jasmine James, female   DOB: [birth year], 68 y.o.   MRN: [id number]    PCP: [name], NP  Allergies  Allergen Reactions  . Codeine   . Sulfa Antibiotics     Chief Complaint  Patient presents with  . Acute Visit    Pt. Has burn on arm from oven X 13 days, infected     HPI: Patient is a 68 y.o. female seen in the office today to evaluation of burn on right arm. Pt went to get some toast out the oven and burned the posterior aspect of her right arm near wrist. Has not healed yet and concerned about infection. Has been using different creams and treatments to help heal it. Now scabbed over. No drainage noted. Slightly tender and puffy around area.    Review of Systems:  Review of Systems  Skin: Positive for wound (from burn, see HPI).  All other systems reviewed and are negative.   Past Medical History  Diagnosis Date  . Acute bronchitis   . Type II or unspecified type diabetes mellitus without mention of complication, not stated as uncontrolled   . Other abnormal blood chemistry   . Plantar fascial fibromatosis   . Myalgia and myositis, unspecified   . Other and unspecified hyperlipidemia   . Osteoporosis, unspecified   . Elevated blood pressure reading without diagnosis of hypertension   . Disorder of bone and cartilage, unspecified    Past Surgical History  Procedure Laterality Date  . Breast surgery  1997    reduction   Social History:   reports that she has never smoked. She has never used smokeless tobacco. She reports that she drinks alcohol. She reports that she does not use illicit drugs.  History reviewed. No pertinent family history.  Medications: Patient's Medications  New Prescriptions   No medications on file  Previous Medications   LISINOPRIL (PRINIVIL,ZESTRIL) 5 MG TABLET    Take 1 tablet (5 mg total) by mouth daily.   METFORMIN (GLUCOPHAGE) 500 MG TABLET    Take 1 tablet (500 mg total) by mouth daily with breakfast.   SIMVASTATIN (ZOCOR) 20 MG TABLET    Take one tablet by mouth every evening to lower cholesterol  Modified Medications   No medications on file  Discontinued Medications   No medications on file     Physical Exam:  Filed Vitals:   03/04/14 1423  BP: 120/80  Pulse: 74  Temp: 99.5 F (37.5 C)  TempSrc: Oral  Height: 5\' 3"  (1.6 m)  Weight: 153 lb 3.2 oz (69.491 kg)  SpO2: 99%    Physical Exam  Constitutional: She is oriented to person, place, and time. She appears well-developed and well-nourished. No distress.  HENT:  Head: Normocephalic and atraumatic.  Eyes: Conjunctivae and EOM are normal. Pupils are equal, round, and reactive to light.  Neck: Normal range of motion. Neck supple.  Cardiovascular: Normal rate and regular rhythm.   Pulmonary/Chest: Effort normal and breath sounds normal.  Abdominal: Soft. Bowel sounds are normal.  Musculoskeletal: Normal range of motion.  Neurological: She is alert and oriented to person, place, and time.  Skin: Skin is warm and dry. She is not diaphoretic. There is erythema (slight erythema surrounding burn, area scabbed over, no drainage noted, no heat area measuring 3 x 2 cm).  Psychiatric: She has a normal mood and affect.    Labs reviewed: Basic Metabolic Panel:  Recent Labs  10/23/13 0806 02/04/14 0830  NA 141  141  K 4.5 4.1  CL 104 102  CO2 21 23  GLUCOSE 102* 102*  BUN 22 18  CREATININE 0.76 0.80  CALCIUM 9.4 9.7   Liver Function Tests:  Recent Labs  10/23/13 0806  AST 26  ALT 28  ALKPHOS 81  BILITOT 0.4  PROT 7.0   No results for input(s): LIPASE, AMYLASE in the last 8760 hours. No results for input(s): AMMONIA in the last 8760 hours. CBC:  Recent Labs  10/23/13 0806  WBC 7.2  NEUTROABS 3.2  HGB 13.3  HCT 40.7  MCV 88  PLT 274   Lipid Panel:  Recent Labs  10/23/13 0806  HDL 42  LDLCALC 82  TRIG 78  CHOLHDL 3.3   TSH: No results for input(s): TSH in the last 8760 hours. A1C: Lab Results    Component Value Date   HGBA1C 5.9* 10/23/2013     Assessment/Plan  1. Burn -healing without acute infection; care instructions given -will give mupirocin ointment (BACTROBAN) 2 %; Apply 1 application topically 2 (two) times daily.  Dispense: 22 g; Refill: 0 to apply until healed, return precautions discussed pt understands

## 2014-03-04 NOTE — Patient Instructions (Signed)
Burn Care Your skin is a natural barrier to infection. It is the largest organ of your body. Burns damage this natural protection. To help prevent infection, it is very important to follow your caregiver's instructions in the care of your burn. Burns are classified as:  First degree. There is only redness of the skin (erythema). No scarring is expected.  Second degree. There is blistering of the skin. Scarring may occur with deeper burns.  Third degree. All layers of the skin are injured, and scarring is expected. HOME CARE INSTRUCTIONS   Wash your hands well before changing your bandage.  Change your bandage as often as directed by your caregiver.  Remove the old bandage. If the bandage sticks, you may soak it off with cool, clean water.  Cleanse the burn thoroughly but gently with mild soap and water.  Pat the area dry with a clean, dry cloth.  Apply a thin layer of antibacterial cream to the burn.  Apply a clean bandage as instructed by your caregiver.  Keep the bandage as clean and dry as possible.  Elevate the affected area for the first 24 hours, then as instructed by your caregiver.  Only take over-the-counter or prescription medicines for pain, discomfort, or fever as directed by your caregiver. SEEK IMMEDIATE MEDICAL CARE IF:   You develop excessive pain.  You develop redness, tenderness, swelling, or red streaks near the burn.  The burned area develops yellowish-white fluid (pus) or a bad smell.  You have a fever. MAKE SURE YOU:   Understand these instructions.  Will watch your condition.  Will get help right away if you are not doing well or get worse. Document Released: 04/02/2005 Document Revised: 06/25/2011 Document Reviewed: 08/23/2010 ExitCare Patient Information 2015 [employer]. This information is not intended to replace advice given to you by your health care provider. Make sure you discuss any questions you have with your health care  provider.  

## 2014-04-16 HISTORY — PX: CATARACT EXTRACTION: SUR2

## 2014-05-11 ENCOUNTER — Other Ambulatory Visit: Payer: Self-pay | Admitting: *Deleted

## 2014-05-11 DIAGNOSIS — E785 Hyperlipidemia, unspecified: Secondary | ICD-10-CM

## 2014-05-11 MED ORDER — SIMVASTATIN 20 MG PO TABS: ORAL_TABLET | ORAL | Status: DC

## 2014-05-11 NOTE — Telephone Encounter (Signed)
[facility] 

## 2014-08-16 ENCOUNTER — Other Ambulatory Visit: Payer: Self-pay | Admitting: *Deleted

## 2014-08-16 DIAGNOSIS — E119 Type 2 diabetes mellitus without complications: Secondary | ICD-10-CM

## 2014-08-16 MED ORDER — METFORMIN HCL 500 MG PO TABS: ORAL_TABLET | ORAL | Status: DC

## 2014-08-16 NOTE — Telephone Encounter (Signed)
[facility] 

## 2014-09-28 LAB — TSH: TSH: 1.67 (ref 0.41–5.90)

## 2014-09-28 LAB — BASIC METABOLIC PANEL: GLUCOSE: 91

## 2014-09-28 LAB — LIPID PANEL
CHOLESTEROL: 153 (ref 0–200)
HDL: 40 (ref 35–70)
LDL CALC: 99
TRIGLYCERIDES: 70 (ref 40–160)

## 2014-09-28 LAB — VITAMIN D 25 HYDROXY (VIT D DEFICIENCY, FRACTURES): VIT D 25 HYDROXY: 25

## 2014-10-28 LAB — HM DEXA SCAN

## 2014-10-28 LAB — LIPID PANEL
CHOLESTEROL: 153 mg/dL (ref 0–200)
HDL: 40 mg/dL (ref 35–70)
LDL Cholesterol: 99 mg/dL
Triglycerides: 70 mg/dL (ref 40–160)

## 2014-10-28 LAB — TSH: TSH: 1.67 u[IU]/mL (ref 0.41–5.90)

## 2014-10-28 LAB — BASIC METABOLIC PANEL
Glucose: 91 mg/dL
POTASSIUM: 3.9 mmol/L (ref 3.4–5.3)
SODIUM: 138 mmol/L (ref 137–147)

## 2014-11-20 ENCOUNTER — Other Ambulatory Visit: Payer: Self-pay | Admitting: Internal Medicine

## 2014-11-24 ENCOUNTER — Telehealth: Payer: Self-pay | Admitting: *Deleted

## 2014-11-24 NOTE — Telephone Encounter (Signed)
LMOM for patient to call office to schedule an appointment with Janett Billow for a follow-up, last seen on 03/04/2014 needs an annual exam.

## 2014-11-26 ENCOUNTER — Encounter: Payer: Self-pay | Admitting: Internal Medicine

## 2014-11-26 ENCOUNTER — Ambulatory Visit (INDEPENDENT_AMBULATORY_CARE_PROVIDER_SITE_OTHER): Payer: Medicare Other | Admitting: Internal Medicine

## 2014-11-26 VITALS — BP 130/90 | HR 77 | Temp 98.6°F | Resp 20 | Ht 63.0 in | Wt 153.2 lb

## 2014-11-26 DIAGNOSIS — E119 Type 2 diabetes mellitus without complications: Secondary | ICD-10-CM

## 2014-11-26 DIAGNOSIS — E785 Hyperlipidemia, unspecified: Secondary | ICD-10-CM | POA: Diagnosis not present

## 2014-11-26 DIAGNOSIS — R030 Elevated blood-pressure reading, without diagnosis of hypertension: Secondary | ICD-10-CM

## 2014-11-26 MED ORDER — METFORMIN HCL 500 MG PO TABS: ORAL_TABLET | ORAL | Status: DC

## 2014-11-26 MED ORDER — SIMVASTATIN 20 MG PO TABS
ORAL_TABLET | ORAL | Status: DC
Start: 2014-11-26 — End: 2015-10-21

## 2014-11-26 NOTE — Patient Instructions (Signed)
Continue current medications as ordered  Follow up in 6 mos for routine visit

## 2014-11-26 NOTE — Progress Notes (Signed)
Patient ID: Jasmine James, female   DOB: December 30, 1945, 69 y.o.   MRN: [id number]    Location:    [facility]   Place of Service:   OFFICE  Chief Complaint  Patient presents with  . Medical Management of Chronic Issues    Last seen on 02/2014, patient needs to be seen today for follow-up and refill medications    HPI:  69 yo female seen today for f/u. She reports feeling well overall. She had annual GYN exam last month with fasting labs drawn (including lipid panel)  HTN - home BP 100/70s. She does not take any med. Stopped lisinopril due to weakness. She reports hx white coat syndrome  DM - shew does not check BS a home. No low BS reactions. No numbness or tingling. She takes 1/2 tab metformin  Dyslipidemia - no myalgias on statin  Past Medical History  Diagnosis Date  . Acute bronchitis   . Type II or unspecified type diabetes mellitus without mention of complication, not stated as uncontrolled   . Other abnormal blood chemistry   . Plantar fascial fibromatosis   . Myalgia and myositis, unspecified   . Other and unspecified hyperlipidemia   . Osteoporosis, unspecified   . Elevated blood pressure reading without diagnosis of hypertension   . Disorder of bone and cartilage, unspecified     Past Surgical History  Procedure Laterality Date  . Breast surgery  1997    reduction    Patient Care Team: [name], NP as PCP - General (Nurse Practitioner)  Social History   Social History  . Marital Status: Married    Spouse Name: N/A  . Number of Children: N/A  . Years of Education: N/A   Occupational History  . Not on file.   Social History Main Topics  . Smoking status: Never Smoker   . Smokeless tobacco: Never Used  . Alcohol Use: Yes     Comment: glass wine every other day  . Drug Use: No  . Sexual Activity: Not on file   Other Topics Concern  . Not on file   Social History Narrative     reports that she has never smoked. She has never used smokeless  tobacco. She reports that she drinks alcohol. She reports that she does not use illicit drugs.  Allergies  Allergen Reactions  . Codeine   . Sulfa Antibiotics     Medications: Patient's Medications  New Prescriptions   No medications on file  Previous Medications   LISINOPRIL (PRINIVIL,ZESTRIL) 5 MG TABLET    Take 1 tablet (5 mg total) by mouth daily.   METFORMIN (GLUCOPHAGE) 500 MG TABLET    Take one tablet by mouth once daily with breakfast to control blood sugar   MUPIROCIN OINTMENT (BACTROBAN) 2 %    Apply 1 application topically 2 (two) times daily.   SIMVASTATIN (ZOCOR) 20 MG TABLET    take 1 tablet by mouth every evening to LOWER cholesterol  Modified Medications   No medications on file  Discontinued Medications   No medications on file    Review of Systems  Constitutional: Negative for fever, chills, diaphoresis, activity change, appetite change and fatigue.  HENT: Negative for ear pain and sore throat.   Eyes: Negative for visual disturbance.  Respiratory: Negative for cough, chest tightness and shortness of breath.   Cardiovascular: Negative for chest pain, palpitations and leg swelling.  Gastrointestinal: Negative for nausea, vomiting, abdominal pain, diarrhea, constipation and blood in stool.  Genitourinary: Negative for dysuria.  Musculoskeletal: Negative for arthralgias.  Neurological: Negative for dizziness, tremors, numbness and headaches.  Psychiatric/Behavioral: Negative for sleep disturbance. The patient is not nervous/anxious.     Filed Vitals:   11/26/14 0949  BP: 130/90  Pulse: 77  Temp: 98.6 F (37 C)  TempSrc: Oral  Resp: 20  Height: 5\' 3"  (1.6 m)  Weight: 153 lb 3.2 oz (69.491 kg)  SpO2: 97%   Body mass index is 27.14 kg/(m^2).  Physical Exam  Constitutional: She is oriented to person, place, and time. She appears well-developed and well-nourished.  HENT:  Mouth/Throat: Oropharynx is clear and moist. No oropharyngeal exudate.  Eyes:  Pupils are equal, round, and reactive to light. No scleral icterus.  Neck: Neck supple. Carotid bruit is not present. No tracheal deviation present. No thyromegaly present.  Cardiovascular: Normal rate, regular rhythm, normal heart sounds and intact distal pulses.  Exam reveals no gallop and no friction rub.   No murmur heard. No LE edema b/l. no calf TTP.   Pulmonary/Chest: Effort normal and breath sounds normal. No stridor. No respiratory distress. She has no wheezes. She has no rales.  Abdominal: Soft. Bowel sounds are normal. She exhibits no distension and no mass. There is no hepatomegaly. There is no tenderness. There is no rebound and no guarding.  Lymphadenopathy:    She has no cervical adenopathy.  Neurological: She is alert and oriented to person, place, and time. She has normal reflexes.  Skin: Skin is warm and dry. No rash noted.  Psychiatric: She has a normal mood and affect. Her behavior is normal. Judgment and thought content normal.   Diabetic Foot Exam - Simple   Simple Foot Form  Diabetic Foot exam was performed with the following findings:  Yes 11/26/2014 10:35 AM  Visual Inspection  See comments:  Yes  Sensation Testing  Intact to touch and monofilament testing bilaterally:  Yes  Pulse Check  Posterior Tibialis and Dorsalis pulse intact bilaterally:  Yes  Comments  Left anterior 5th toe callus but no ulceration. Dry cracking skin on plantar surface b/l. no ulcerations        Labs reviewed: No visits with results within 3 Month(s) from this visit. Latest known visit with results is:  Appointment on 02/04/2014  Component Date Value Ref Range Status  . Glucose 02/04/2014 102* 65 - 99 mg/dL Final  . BUN 02/04/2014 18  8 - 27 mg/dL Final  . Creatinine, Ser 02/04/2014 0.80  0.57 - 1.00 mg/dL Final  . GFR calc non Af Amer 02/04/2014 77  >59 mL/min/1.73 Final  . GFR calc Af Amer 02/04/2014 88  >59 mL/min/1.73 Final  . BUN/Creatinine Ratio 02/04/2014 23  11 - 26  Final  . Sodium 02/04/2014 141  134 - 144 mmol/L Final  . Potassium 02/04/2014 4.1  3.5 - 5.2 mmol/L Final  . Chloride 02/04/2014 102  97 - 108 mmol/L Final  . CO2 02/04/2014 23  18 - 29 mmol/L Final  . Calcium 02/04/2014 9.7  8.7 - 10.3 mg/dL Final    No results found.   Assessment/Plan   ICD-9-CM ICD-10-CM   1. Type 2 diabetes mellitus without complication 341.96 Q22.9 Hemoglobin A1c     Microalbumin/Creatinine Ratio, Urine     CMP     metFORMIN (GLUCOPHAGE) 500 MG tablet  2. Hyperlipidemia LDL goal <100 272.4 E78.5 CMP     simvastatin (ZOCOR) 20 MG tablet  3. Elevated blood pressure reading without diagnosis of hypertension 796.2 R03.0  Continue current medications as ordered  Get lab results from GYN  Follow up in 6 mos for routine visit  Anniya Whiters S. Perlie Gold  [facility] [address] [place], [place] [address] [phone] Cell (Monday-Friday 8 AM - 5 PM) [phone] After 5 PM and follow prompts

## 2014-11-27 LAB — COMPREHENSIVE METABOLIC PANEL
ALT: 26 IU/L (ref 0–32)
AST: 27 IU/L (ref 0–40)
Albumin/Globulin Ratio: 1.8 (ref 1.1–2.5)
Albumin: 4.5 g/dL (ref 3.6–4.8)
Alkaline Phosphatase: 81 IU/L (ref 39–117)
BUN/Creatinine Ratio: 22 (ref 11–26)
BUN: 20 mg/dL (ref 8–27)
Bilirubin Total: 0.2 mg/dL (ref 0.0–1.2)
CHLORIDE: 103 mmol/L (ref 97–108)
CO2: 25 mmol/L (ref 18–29)
Calcium: 9.8 mg/dL (ref 8.7–10.3)
Creatinine, Ser: 0.91 mg/dL (ref 0.57–1.00)
GFR calc Af Amer: 75 mL/min/{1.73_m2} (ref >59)
GFR, EST NON AFRICAN AMERICAN: 65 mL/min/{1.73_m2} (ref >59)
GLUCOSE: 83 mg/dL (ref 65–99)
Globulin, Total: 2.5 g/dL (ref 1.5–4.5)
Potassium: 4.6 mmol/L (ref 3.5–5.2)
SODIUM: 143 mmol/L (ref 134–144)
Total Protein: 7 g/dL (ref 6.0–8.5)

## 2014-11-27 LAB — MICROALBUMIN / CREATININE URINE RATIO
CREATININE, UR: 131 mg/dL
MICROALB/CREAT RATIO: 4.7 mg/g creat (ref 0.0–30.0)
Microalbumin, Urine: 6.1 ug/mL

## 2014-11-27 LAB — HEMOGLOBIN A1C
Est. average glucose Bld gHb Est-mCnc: 131 mg/dL
HEMOGLOBIN A1C: 6.2 % — AB (ref 4.8–5.6)

## 2015-02-17 LAB — HM DIABETES EYE EXAM

## 2015-07-21 ENCOUNTER — Encounter: Payer: Self-pay | Admitting: Internal Medicine

## 2015-07-21 ENCOUNTER — Encounter: Payer: Self-pay | Admitting: *Deleted

## 2015-07-21 ENCOUNTER — Ambulatory Visit (INDEPENDENT_AMBULATORY_CARE_PROVIDER_SITE_OTHER): Payer: Medicare Other | Admitting: Internal Medicine

## 2015-07-21 VITALS — BP 132/78 | HR 99 | Temp 98.7°F | Resp 20 | Ht 63.0 in | Wt 153.8 lb

## 2015-07-21 DIAGNOSIS — E119 Type 2 diabetes mellitus without complications: Secondary | ICD-10-CM

## 2015-07-21 DIAGNOSIS — E785 Hyperlipidemia, unspecified: Secondary | ICD-10-CM | POA: Diagnosis not present

## 2015-07-21 DIAGNOSIS — M8580 Other specified disorders of bone density and structure, unspecified site: Secondary | ICD-10-CM | POA: Diagnosis not present

## 2015-07-21 DIAGNOSIS — E118 Type 2 diabetes mellitus with unspecified complications: Secondary | ICD-10-CM | POA: Insufficient documentation

## 2015-07-21 DIAGNOSIS — I10 Essential (primary) hypertension: Secondary | ICD-10-CM | POA: Diagnosis not present

## 2015-07-21 DIAGNOSIS — Z23 Encounter for immunization: Secondary | ICD-10-CM | POA: Diagnosis not present

## 2015-07-21 MED ORDER — ASPIRIN EC 81 MG PO TBEC: 81.0000 mg | DELAYED_RELEASE_TABLET | Freq: Every day | ORAL | Status: DC

## 2015-07-21 NOTE — Progress Notes (Signed)
Patient ID: Jasmine James, female   DOB: 1946/04/16, 70 y.o.   MRN: [id number]   Location:  [facility] clinic Provider:  Da Michelle L. Mariea Clonts, D.O., C.M.D.  Code Status: full code Goals of Care:  Advanced Directives 07/21/2015  Does patient have an advance directive? No  Would patient like information on creating an advanced directive? Yes - Multimedia programmer Complaint  Patient presents with  . Medical Management of Chronic Issues    Cologuard given,    HPI: Patient is a 70 y.o. female seen today for medical management of chronic diseases.    Agreed to cologuard.  Is going to try yogurt for nausea.  It comes and goes.  Happens different times.  Started at the time when she developed a hemorrhoid.  Does always take metformin with food.    Still exercising regularly and following healthy balanced diet.    Past Medical History  Diagnosis Date  . Acute bronchitis   . Type II or unspecified type diabetes mellitus without mention of complication, not stated as uncontrolled   . Other abnormal blood chemistry   . Plantar fascial fibromatosis   . Myalgia and myositis, unspecified   . Other and unspecified hyperlipidemia   . Osteoporosis, unspecified   . Elevated blood pressure reading without diagnosis of hypertension   . Disorder of bone and cartilage, unspecified     Past Surgical History  Procedure Laterality Date  . Breast surgery  1997    reduction  . Eye surgery      Left eye contracts removed    Allergies  Allergen Reactions  . Codeine   . Sulfa Antibiotics       Medication List       This list is accurate as of: 07/21/15  9:40 AM.  Always use your most recent med list.               metFORMIN 500 MG tablet  Commonly known as:  GLUCOPHAGE  Take one tablet by mouth once daily with breakfast to control blood sugar     simvastatin 20 MG tablet  Commonly known as:  ZOCOR  take 1 tablet by mouth every evening to LOWER cholesterol        Review of  Systems:  Review of Systems  Constitutional: Negative for fever, chills and malaise/fatigue.  HENT: Negative for congestion and hearing loss.   Eyes: Negative for blurred vision.  Respiratory: Negative for cough and shortness of breath.   Cardiovascular: Negative for chest pain and leg swelling.  Gastrointestinal: Positive for nausea. Negative for heartburn, vomiting, abdominal pain, constipation, blood in stool and melena.       Hemorrhoids  Genitourinary: Negative for dysuria, urgency and frequency.  Musculoskeletal: Negative for myalgias and falls.  Skin: Negative for rash.  Neurological: Negative for dizziness, loss of consciousness and weakness.  Psychiatric/Behavioral: Negative for depression and memory loss.    Health Maintenance  Topic Date Due  . Hepatitis C Screening  09-30-1945  . ZOSTAVAX  03/11/2006  . PNA vac Low Risk Adult (2 of 2 - PPSV23) 10/23/2014  . HEMOGLOBIN A1C  05/29/2015  . MAMMOGRAM  10/16/2015  . INFLUENZA VACCINE  11/15/2015  . FOOT EXAM  11/26/2015  . URINE MICROALBUMIN  11/26/2015  . OPHTHALMOLOGY EXAM  02/17/2016  . COLONOSCOPY  04/16/2016  . TETANUS/TDAP  04/25/2019  . DEXA SCAN  Completed    Physical Exam: Filed Vitals:   07/21/15 0903  BP: 132/78  Pulse: 99  Temp: 98.7 F (37.1 C)  TempSrc: Oral  Resp: 20  Height: 5\' 3"  (1.6 m)  Weight: 153 lb 12.8 oz (69.763 kg)  SpO2: 96%   Body mass index is 27.25 kg/(m^2). Physical Exam  Constitutional: She is oriented to person, place, and time. She appears well-developed and well-nourished.  Cardiovascular: Normal rate, regular rhythm, normal heart sounds and intact distal pulses.   Pulmonary/Chest: Effort normal and breath sounds normal. No respiratory distress.  Musculoskeletal: Normal range of motion.  Neurological: She is alert and oriented to person, place, and time.  Skin: Skin is warm and dry.  Psychiatric: She has a normal mood and affect.    Labs reviewed: Basic Metabolic  Panel:  Recent Labs  10/28/14 11/26/14 1032  NA 138 143  K 3.9 4.6  CL  --  103  CO2  --  25  GLUCOSE  --  83  BUN  --  20  CREATININE  --  0.91  CALCIUM  --  9.8  TSH 1.67  --    Liver Function Tests:  Recent Labs  11/26/14 1032  AST 27  ALT 26  ALKPHOS 81  BILITOT 0.2  PROT 7.0  ALBUMIN 4.5   No results for input(s): LIPASE, AMYLASE in the last 8760 hours. No results for input(s): AMMONIA in the last 8760 hours. CBC: No results for input(s): WBC, NEUTROABS, HGB, HCT, MCV, PLT in the last 8760 hours. Lipid Panel:  Recent Labs  10/28/14  CHOL 153  HDL 40  LDLCALC 99  TRIG 70   Lab Results  Component Value Date   HGBA1C 6.2* 11/26/2014   Assessment/Plan 1. Hyperlipidemia LDL goal <70 -on simvastatin 20mg , to come in for fasting labs asap -cont diet and exercise  2. Controlled type 2 diabetes mellitus without complication, without long-term current use of insulin (HCC) -cont diet and exercise -cont metformin WITH food, f/u hba1c and urine miroalbumin as she is not on ace, bp at goal w/o anything  3. Osteopenia -not on ca or vitamin D  -was noted on summer bone density -will need to discuss these recommendations at her next visit  4. Essential hypertension, benign -bp is at goal without medications, cont diet and exercise  Labs/tests ordered:   Orders Placed This Encounter  Procedures  . Microalbumin/Creatinine Ratio, Urine    Standing Status: Future     Number of Occurrences: 1     Standing Expiration Date: 10/20/2015  . CBC with Differential/Platelet    Standing Status: Future     Number of Occurrences: 1     Standing Expiration Date: 10/20/2015  . Comprehensive metabolic panel    Standing Status: Future     Number of Occurrences: 1     Standing Expiration Date: 10/20/2015    Order Specific Question:  Has the patient fasted?    Answer:  Yes  . Hemoglobin A1c    Standing Status: Future     Number of Occurrences: 1     Standing Expiration Date:  10/20/2015  . Lipid panel    Standing Status: Future     Number of Occurrences: 1     Standing Expiration Date: 10/20/2015    Order Specific Question:  Has the patient fasted?    Answer:  Yes    Next appt:  6 mos for annual exam (with Jessica)   [place]. Masao Junker, D.O. [facility] [address], [place] [address] Cell Phone (Mon-Fri  8am-5pm):  [phone] On Call:  [phone] & follow prompts after 5pm & weekends Office Phone:  [phone] Office Fax:  [phone]

## 2015-07-23 ENCOUNTER — Other Ambulatory Visit: Payer: Self-pay | Admitting: Internal Medicine

## 2015-07-27 ENCOUNTER — Other Ambulatory Visit: Payer: Medicare Other

## 2015-07-27 DIAGNOSIS — E119 Type 2 diabetes mellitus without complications: Secondary | ICD-10-CM

## 2015-07-27 DIAGNOSIS — E785 Hyperlipidemia, unspecified: Secondary | ICD-10-CM

## 2015-07-28 LAB — COMPREHENSIVE METABOLIC PANEL
ALT: 19 IU/L (ref 0–32)
AST: 22 IU/L (ref 0–40)
Albumin/Globulin Ratio: 1.8 (ref 1.2–2.2)
Albumin: 4.4 g/dL (ref 3.6–4.8)
Alkaline Phosphatase: 82 IU/L (ref 39–117)
BUN/Creatinine Ratio: 27 (ref 12–28)
BUN: 20 mg/dL (ref 8–27)
Bilirubin Total: 0.4 mg/dL (ref 0.0–1.2)
CO2: 23 mmol/L (ref 18–29)
Calcium: 9.4 mg/dL (ref 8.7–10.3)
Chloride: 102 mmol/L (ref 96–106)
Creatinine, Ser: 0.75 mg/dL (ref 0.57–1.00)
GFR calc Af Amer: 94 mL/min/{1.73_m2} (ref >59)
GFR calc non Af Amer: 82 mL/min/{1.73_m2} (ref >59)
Globulin, Total: 2.5 g/dL (ref 1.5–4.5)
Glucose: 99 mg/dL (ref 65–99)
Potassium: 4.3 mmol/L (ref 3.5–5.2)
Sodium: 139 mmol/L (ref 134–144)
Total Protein: 6.9 g/dL (ref 6.0–8.5)

## 2015-07-28 LAB — CBC WITH DIFFERENTIAL/PLATELET
Basophils Absolute: 0.1 10*3/uL (ref 0.0–0.2)
Basos: 1 %
EOS (ABSOLUTE): 0.1 10*3/uL (ref 0.0–0.4)
Eos: 2 %
Hematocrit: 40.5 % (ref 34.0–46.6)
Hemoglobin: 13.2 g/dL (ref 11.1–15.9)
Immature Grans (Abs): 0 10*3/uL (ref 0.0–0.1)
Immature Granulocytes: 0 %
Lymphocytes Absolute: 2.1 10*3/uL (ref 0.7–3.1)
Lymphs: 45 %
MCH: 28.2 pg (ref 26.6–33.0)
MCHC: 32.6 g/dL (ref 31.5–35.7)
MCV: 87 fL (ref 79–97)
Monocytes Absolute: 0.5 10*3/uL (ref 0.1–0.9)
Monocytes: 10 %
Neutrophils Absolute: 2 10*3/uL (ref 1.4–7.0)
Neutrophils: 42 %
Platelets: 268 10*3/uL (ref 150–379)
RBC: 4.68 x10E6/uL (ref 3.77–5.28)
RDW: 14.4 % (ref 12.3–15.4)
WBC: 4.7 10*3/uL (ref 3.4–10.8)

## 2015-07-28 LAB — LIPID PANEL
Chol/HDL Ratio: 2.9 ratio units (ref 0.0–4.4)
Cholesterol, Total: 131 mg/dL (ref 100–199)
HDL: 45 mg/dL (ref >39)
LDL Calculated: 73 mg/dL (ref 0–99)
Triglycerides: 64 mg/dL (ref 0–149)
VLDL Cholesterol Cal: 13 mg/dL (ref 5–40)

## 2015-07-28 LAB — HEMOGLOBIN A1C
Est. average glucose Bld gHb Est-mCnc: 126 mg/dL
Hgb A1c MFr Bld: 6 % — ABNORMAL HIGH (ref 4.8–5.6)

## 2015-07-28 LAB — MICROALBUMIN / CREATININE URINE RATIO
Creatinine, Urine: 112.8 mg/dL
MICROALB/CREAT RATIO: 6.3 mg/g creat (ref 0.0–30.0)
Microalbumin, Urine: 7.1 ug/mL

## 2015-10-21 ENCOUNTER — Other Ambulatory Visit: Payer: Self-pay | Admitting: Internal Medicine

## 2016-03-01 ENCOUNTER — Other Ambulatory Visit: Payer: Self-pay | Admitting: Internal Medicine

## 2016-05-23 ENCOUNTER — Other Ambulatory Visit: Payer: Self-pay | Admitting: Internal Medicine

## 2016-06-28 DIAGNOSIS — K621 Rectal polyp: Secondary | ICD-10-CM | POA: Diagnosis not present

## 2016-06-28 DIAGNOSIS — Z1211 Encounter for screening for malignant neoplasm of colon: Secondary | ICD-10-CM | POA: Diagnosis not present

## 2016-06-28 DIAGNOSIS — Z8379 Family history of other diseases of the digestive system: Secondary | ICD-10-CM | POA: Diagnosis not present

## 2016-06-28 DIAGNOSIS — D128 Benign neoplasm of rectum: Secondary | ICD-10-CM | POA: Diagnosis not present

## 2016-06-28 DIAGNOSIS — K5730 Diverticulosis of large intestine without perforation or abscess without bleeding: Secondary | ICD-10-CM | POA: Diagnosis not present

## 2016-06-28 DIAGNOSIS — Z8719 Personal history of other diseases of the digestive system: Secondary | ICD-10-CM | POA: Diagnosis not present

## 2016-06-28 LAB — HM SIGMOIDOSCOPY

## 2016-07-03 ENCOUNTER — Other Ambulatory Visit: Payer: Self-pay | Admitting: Nurse Practitioner

## 2016-07-03 ENCOUNTER — Encounter: Payer: Self-pay | Admitting: Nurse Practitioner

## 2016-07-03 DIAGNOSIS — I10 Essential (primary) hypertension: Secondary | ICD-10-CM

## 2016-07-03 DIAGNOSIS — E785 Hyperlipidemia, unspecified: Secondary | ICD-10-CM

## 2016-07-03 DIAGNOSIS — E119 Type 2 diabetes mellitus without complications: Secondary | ICD-10-CM

## 2016-07-09 ENCOUNTER — Telehealth: Payer: Self-pay | Admitting: Nurse Practitioner

## 2016-07-09 NOTE — Telephone Encounter (Signed)
left msg asking pt to confirm this lab appt and rescheduled AWV. pt now has HTA and they won't pay for same day AWV/CPE. VDM (DD)

## 2016-08-02 DIAGNOSIS — Z1211 Encounter for screening for malignant neoplasm of colon: Secondary | ICD-10-CM | POA: Diagnosis not present

## 2016-08-02 DIAGNOSIS — K649 Unspecified hemorrhoids: Secondary | ICD-10-CM | POA: Diagnosis not present

## 2016-08-02 DIAGNOSIS — Z86010 Personal history of colonic polyps: Secondary | ICD-10-CM | POA: Diagnosis not present

## 2016-08-02 DIAGNOSIS — K5730 Diverticulosis of large intestine without perforation or abscess without bleeding: Secondary | ICD-10-CM | POA: Diagnosis not present

## 2016-08-02 DIAGNOSIS — K635 Polyp of colon: Secondary | ICD-10-CM | POA: Diagnosis not present

## 2016-08-02 DIAGNOSIS — D125 Benign neoplasm of sigmoid colon: Secondary | ICD-10-CM | POA: Diagnosis not present

## 2016-08-16 ENCOUNTER — Ambulatory Visit (INDEPENDENT_AMBULATORY_CARE_PROVIDER_SITE_OTHER): Payer: PPO

## 2016-08-16 ENCOUNTER — Other Ambulatory Visit: Payer: PPO

## 2016-08-16 ENCOUNTER — Other Ambulatory Visit: Payer: Self-pay

## 2016-08-16 VITALS — BP 120/60 | HR 89 | Temp 97.9°F | Ht 63.0 in | Wt 152.0 lb

## 2016-08-16 DIAGNOSIS — E785 Hyperlipidemia, unspecified: Secondary | ICD-10-CM

## 2016-08-16 DIAGNOSIS — Z23 Encounter for immunization: Secondary | ICD-10-CM

## 2016-08-16 DIAGNOSIS — E119 Type 2 diabetes mellitus without complications: Secondary | ICD-10-CM | POA: Diagnosis not present

## 2016-08-16 DIAGNOSIS — I10 Essential (primary) hypertension: Secondary | ICD-10-CM | POA: Diagnosis not present

## 2016-08-16 DIAGNOSIS — Z0000 Encounter for general adult medical examination without abnormal findings: Secondary | ICD-10-CM

## 2016-08-16 LAB — CBC WITH DIFFERENTIAL/PLATELET
BASOS PCT: 1 %
Basophils Absolute: 49 cells/uL (ref 0–200)
Eosinophils Absolute: 98 cells/uL (ref 15–500)
Eosinophils Relative: 2 %
HCT: 37.1 % (ref 35.0–45.0)
Hemoglobin: 12.2 g/dL (ref 11.7–15.5)
LYMPHS PCT: 43 %
Lymphs Abs: 2107 cells/uL (ref 850–3900)
MCH: 29 pg (ref 27.0–33.0)
MCHC: 32.9 g/dL (ref 32.0–36.0)
MCV: 88.1 fL (ref 80.0–100.0)
MONOS PCT: 10 %
MPV: 9.1 fL (ref 7.5–12.5)
Monocytes Absolute: 490 cells/uL (ref 200–950)
Neutro Abs: 2156 cells/uL (ref 1500–7800)
Neutrophils Relative %: 44 %
PLATELETS: 321 10*3/uL (ref 140–400)
RBC: 4.21 MIL/uL (ref 3.80–5.10)
RDW: 15.1 % — AB (ref 11.0–15.0)
WBC: 4.9 10*3/uL (ref 3.8–10.8)

## 2016-08-16 LAB — COMPLETE METABOLIC PANEL WITH GFR
ALT: 21 U/L (ref 6–29)
AST: 25 U/L (ref 10–35)
Albumin: 4.4 g/dL (ref 3.6–5.1)
Alkaline Phosphatase: 71 U/L (ref 33–130)
BILIRUBIN TOTAL: 0.4 mg/dL (ref 0.2–1.2)
BUN: 14 mg/dL (ref 7–25)
CALCIUM: 9.4 mg/dL (ref 8.6–10.4)
CO2: 24 mmol/L (ref 20–31)
CREATININE: 0.73 mg/dL (ref 0.60–0.93)
Chloride: 106 mmol/L (ref 98–110)
GFR, Est Non African American: 84 mL/min (ref >=60)
Glucose, Bld: 100 mg/dL — ABNORMAL HIGH (ref 65–99)
Potassium: 4.2 mmol/L (ref 3.5–5.3)
Sodium: 140 mmol/L (ref 135–146)
TOTAL PROTEIN: 7.1 g/dL (ref 6.1–8.1)

## 2016-08-16 LAB — LIPID PANEL
CHOLESTEROL: 154 mg/dL (ref <200)
HDL: 39 mg/dL — ABNORMAL LOW (ref >50)
LDL Cholesterol: 91 mg/dL (ref <100)
TRIGLYCERIDES: 120 mg/dL (ref <150)
Total CHOL/HDL Ratio: 3.9 Ratio (ref <5.0)
VLDL: 24 mg/dL (ref <30)

## 2016-08-16 MED ORDER — ZOSTER VAC RECOMB ADJUVANTED 50 MCG/0.5ML IM SUSR: 0.5000 mL | Freq: Once | INTRAMUSCULAR | 1 refills | Status: AC

## 2016-08-16 MED ORDER — METFORMIN HCL 500 MG PO TABS: ORAL_TABLET | ORAL | 2 refills | Status: DC

## 2016-08-16 MED ORDER — SIMVASTATIN 20 MG PO TABS: ORAL_TABLET | ORAL | 2 refills | Status: DC

## 2016-08-16 NOTE — Progress Notes (Signed)
Subjective:   Jasmine James is a 71 y.o. female who presents for an Initial Medicare Annual Wellness Visit. Cardiac Risk Factors include: advanced age (>55men, >64 women);diabetes mellitus;hypertension     Objective:    Today's Vitals   08/16/16 0936  BP: 120/60  Pulse: 89  Temp: 97.9 F (36.6 C)  TempSrc: Oral  SpO2: 97%  Weight: 152 lb (68.9 kg)  Height: 5\' 3"  (1.6 m)   Body mass index is 26.93 kg/m.   Current Medications (verified) Outpatient Encounter Prescriptions as of 08/16/2016  Medication Sig  . aspirin EC 81 MG tablet Take 1 tablet (81 mg total) by mouth daily.  . calcium-vitamin D (OSCAL WITH D) 500-200 MG-UNIT tablet Take 1 tablet by mouth.  . metFORMIN (GLUCOPHAGE) 500 MG tablet take 1 tablet by mouth once daily WITH BREAKFAST TO CONTROL SUGAR  . simvastatin (ZOCOR) 20 MG tablet take 1 tablet by mouth every evening TO LOWER CHOLESTEROL  . Zoster Vac Recomb Adjuvanted [facility]) injection Inject 0.5 mLs into the muscle once.  . [DISCONTINUED] Zoster Vac Recomb Adjuvanted (SHINGRIX) injection Inject 0.5 mLs into the muscle once.   No facility-administered encounter medications on file as of 08/16/2016.     Allergies (verified) Codeine and Sulfa antibiotics   History: Past Medical History:  Diagnosis Date  . Acute bronchitis   . Disorder of bone and cartilage, unspecified   . Elevated blood pressure reading without diagnosis of hypertension   . Myalgia and myositis, unspecified   . Osteoporosis, unspecified   . Other abnormal blood chemistry   . Other and unspecified hyperlipidemia   . Plantar fascial fibromatosis   . Type II or unspecified type diabetes mellitus without mention of complication, not stated as uncontrolled    Past Surgical History:  Procedure Laterality Date  . BREAST SURGERY  1997   reduction  . EYE SURGERY     Left eye contracts removed   History reviewed. No pertinent family history. Social History   Occupational History  .  Not on file.   Social History Main Topics  . Smoking status: Never Smoker  . Smokeless tobacco: Never Used  . Alcohol use Yes     Comment: glass wine every day  . Drug use: No  . Sexual activity: Not on file    Tobacco Counseling Counseling given: Not Answered   Activities of Daily Living In your present state of health, do you have any difficulty performing the following activities: 08/16/2016  Hearing? N  Vision? N  Difficulty concentrating or making decisions? N  Walking or climbing stairs? N  Dressing or bathing? N  Doing errands, shopping? N  Preparing Food and eating ? N  Using the Toilet? N  In the past six months, have you accidently leaked urine? N  Do you have problems with loss of bowel control? N  Managing your Medications? N  Managing your Finances? N  Housekeeping or managing your Housekeeping? N  Some recent data might be hidden    Immunizations and Health Maintenance Immunization History  Administered Date(s) Administered  . Influenza-Unspecified 02/14/2015  . Pneumococcal Conjugate-13 10/22/2013  . Pneumococcal Polysaccharide-23 08/16/2016  . Td 04/24/2009   Health Maintenance Due  Topic Date Due  . Hepatitis C Screening  1945-05-15  . FOOT EXAM  11/26/2015  . HEMOGLOBIN A1C  01/26/2016  . URINE MICROALBUMIN  07/26/2016    Patient Care Team: [name], NP as PCP - General (Nurse Practitioner)  Indicate any recent Medical Services  you may have received from other than [facility] providers in the past year (date may be approximate).     Assessment:   This is a routine wellness examination for Jasmine James.   Hearing/Vision screen No exam data present  Dietary issues and exercise activities discussed: Current Exercise Habits: Home exercise routine, Type of exercise: walking, Time (Minutes): 60, Frequency (Times/Week): 3, Weekly Exercise (Minutes/Week): 180, Intensity: Mild, Exercise limited by: None identified  Goals    . Stress reliever            Starting 08/16/2016 I will focus on myself and stress less.      Depression Screen PHQ 2/9 Scores 08/16/2016 07/21/2015 03/04/2014 10/22/2013 07/24/2012  PHQ - 2 Score 0 0 0 0 0    Fall Risk Fall Risk  08/16/2016 07/21/2015 11/26/2014 03/04/2014 10/22/2013  Falls in the past year? No No No No No    Cognitive Function: MMSE - Mini Mental State Exam 08/16/2016  Orientation to time 5  Orientation to Place 5  Registration 3  Attention/ Calculation 5  Recall 2  Language- name 2 objects 2  Language- repeat 1  Language- follow 3 step command 3  Language- read & follow direction 1  Write a sentence 1  Copy design 1  Total score 29        Screening Tests Health Maintenance  Topic Date Due  . Hepatitis C Screening  15-Apr-1946  . FOOT EXAM  11/26/2015  . HEMOGLOBIN A1C  01/26/2016  . URINE MICROALBUMIN  07/26/2016  . MAMMOGRAM  01/14/2017 (Originally 10/16/2015)  . INFLUENZA VACCINE  11/14/2016  . OPHTHALMOLOGY EXAM  01/14/2017  . TETANUS/TDAP  04/25/2019  . COLONOSCOPY  06/29/2026  . DEXA SCAN  Completed  . PNA vac Low Risk Adult  Completed      Plan:     I have personally reviewed and addressed the Medicare Annual Wellness questionnaire and have noted the following in the patient's chart:  A. Medical and social history B. Use of alcohol, tobacco or illicit drugs  C. Current medications and supplements D. Functional ability and status E.  Nutritional status F.  Physical activity G. Advance directives H. List of other physicians I.  Hospitalizations, surgeries, and ER visits in previous 12 months J.  [place] to include hearing, vision, cognitive, depression L. Referrals and appointments - none  In addition, I have reviewed and discussed with patient certain preventive protocols, quality metrics, and best practice recommendations. A written personalized care plan for preventive services as well as general preventive health recommendations were provided to  patient.  See attached scanned questionnaire for additional information.   Signed,   Rich Reining, RN Nurse Health Advisor  I reviewed health advisors note, was available for consultation and agree with documentation and plan.  [employer]. Harle Battiest  [facility] [phone] 8 am - 5 pm) [phone] (after hours)

## 2016-08-16 NOTE — Patient Instructions (Signed)
Jasmine James , Thank you for taking time to come for your Medicare Wellness Visit. I appreciate your ongoing commitment to your health goals. Please review the following plan we discussed and let me know if I can assist you in the future.   Screening recommendations/referrals: Colonoscopy up to date Mammogram due.  Bone Density up to date Recommended yearly ophthalmology/optometry visit for glaucoma screening and checkup Recommended yearly dental visit for hygiene and checkup  Vaccinations: Influenza vaccine up to date Pneumococcal vaccine up to date. PN23 given today Tdap vaccine up to date. Due 04/24/2009 Shingles vaccine due.   Advanced directives: Copy requested  Conditions/risks identified: None  Next appointment: Sherrie Mustache NP 5/10 @ 10:45am   Preventive Care 65 Years and Older, Female Preventive care refers to lifestyle choices and visits with your health care provider that can promote health and wellness. What does preventive care include?  A yearly physical exam. This is also called an annual well check.  Dental exams once or twice a year.  Routine eye exams. Ask your health care provider how often you should have your eyes checked.  Personal lifestyle choices, including:  Daily care of your teeth and gums.  Regular physical activity.  Eating a healthy diet.  Avoiding tobacco and drug use.  Limiting alcohol use.  Practicing safe sex.  Taking low-dose aspirin every day.  Taking vitamin and mineral supplements as recommended by your health care provider. What happens during an annual well check? The services and screenings done by your health care provider during your annual well check will depend on your age, overall health, lifestyle risk factors, and family history of disease. Counseling  Your health care provider may ask you questions about your:  Alcohol use.  Tobacco use.  Drug use.  Emotional well-being.  Home and relationship  well-being.  Sexual activity.  Eating habits.  History of falls.  Memory and ability to understand (cognition).  Work and work Statistician.  Reproductive health. Screening  You may have the following tests or measurements:  Height, weight, and BMI.  Blood pressure.  Lipid and cholesterol levels. These may be checked every 5 years, or more frequently if you are over 52 years old.  Skin check.  Lung cancer screening. You may have this screening every year starting at age 37 if you have a 30-pack-year history of smoking and currently smoke or have quit within the past 15 years.  Fecal occult blood test (FOBT) of the stool. You may have this test every year starting at age 58.  Flexible sigmoidoscopy or colonoscopy. You may have a sigmoidoscopy every 5 years or a colonoscopy every 10 years starting at age 45.  Hepatitis C blood test.  Hepatitis B blood test.  Sexually transmitted disease (STD) testing.  Diabetes screening. This is done by checking your blood sugar (glucose) after you have not eaten for a while (fasting). You may have this done every 1-3 years.  Bone density scan. This is done to screen for osteoporosis. You may have this done starting at age 6.  Mammogram. This may be done every 1-2 years. Talk to your health care provider about how often you should have regular mammograms. Talk with your health care provider about your test results, treatment options, and if necessary, the need for more tests. Vaccines  Your health care provider may recommend certain vaccines, such as:  Influenza vaccine. This is recommended every year.  Tetanus, diphtheria, and acellular pertussis (Tdap, Td) vaccine. You may need a Td  booster every 10 years.  Zoster vaccine. You may need this after age 55.  Pneumococcal 13-valent conjugate (PCV13) vaccine. One dose is recommended after age 47.  Pneumococcal polysaccharide (PPSV23) vaccine. One dose is recommended after age  39. Talk to your health care provider about which screenings and vaccines you need and how often you need them. This information is not intended to replace advice given to you by your health care provider. Make sure you discuss any questions you have with your health care provider. Document Released: 04/29/2015 Document Revised: 12/21/2015 Document Reviewed: 02/01/2015 Elsevier Interactive Patient Education  2017 [employer] Prevention in the Home Falls can cause injuries. They can happen to people of all ages. There are many things you can do to make your home safe and to help prevent falls. What can I do on the outside of my home?  Regularly fix the edges of walkways and driveways and fix any cracks.  Remove anything that might make you trip as you walk through a door, such as a raised step or threshold.  Trim any bushes or trees on the path to your home.  Use bright outdoor lighting.  Clear any walking paths of anything that might make someone trip, such as rocks or tools.  Regularly check to see if handrails are loose or broken. Make sure that both sides of any steps have handrails.  Any raised decks and porches should have guardrails on the edges.  Have any leaves, snow, or ice cleared regularly.  Use sand or salt on walking paths during winter.  Clean up any spills in your garage right away. This includes oil or grease spills. What can I do in the bathroom?  Use night lights.  Install grab bars by the toilet and in the tub and shower. Do not use towel bars as grab bars.  Use non-skid mats or decals in the tub or shower.  If you need to sit down in the shower, use a plastic, non-slip stool.  Keep the floor dry. Clean up any water that spills on the floor as soon as it happens.  Remove soap buildup in the tub or shower regularly.  Attach bath mats securely with double-sided non-slip rug tape.  Do not have throw rugs and other things on the floor that can make  you trip. What can I do in the bedroom?  Use night lights.  Make sure that you have a light by your bed that is easy to reach.  Do not use any sheets or blankets that are too big for your bed. They should not hang down onto the floor.  Have a firm chair that has side arms. You can use this for support while you get dressed.  Do not have throw rugs and other things on the floor that can make you trip. What can I do in the kitchen?  Clean up any spills right away.  Avoid walking on wet floors.  Keep items that you use a lot in easy-to-reach places.  If you need to reach something above you, use a strong step stool that has a grab bar.  Keep electrical cords out of the way.  Do not use floor polish or wax that makes floors slippery. If you must use wax, use non-skid floor wax.  Do not have throw rugs and other things on the floor that can make you trip. What can I do with my stairs?  Do not leave any items on the stairs.  Make sure that there are handrails on both sides of the stairs and use them. Fix handrails that are broken or loose. Make sure that handrails are as long as the stairways.  Check any carpeting to make sure that it is firmly attached to the stairs. Fix any carpet that is loose or worn.  Avoid having throw rugs at the top or bottom of the stairs. If you do have throw rugs, attach them to the floor with carpet tape.  Make sure that you have a light switch at the top of the stairs and the bottom of the stairs. If you do not have them, ask someone to add them for you. What else can I do to help prevent falls?  Wear shoes that:  Do not have high heels.  Have rubber bottoms.  Are comfortable and fit you well.  Are closed at the toe. Do not wear sandals.  If you use a stepladder:  Make sure that it is fully opened. Do not climb a closed stepladder.  Make sure that both sides of the stepladder are locked into place.  Ask someone to hold it for you, if  possible.  Clearly mark and make sure that you can see:  Any grab bars or handrails.  First and last steps.  Where the edge of each step is.  Use tools that help you move around (mobility aids) if they are needed. These include:  Canes.  Walkers.  Scooters.  Crutches.  Turn on the lights when you go into a dark area. Replace any light bulbs as soon as they burn out.  Set up your furniture so you have a clear path. Avoid moving your furniture around.  If any of your floors are uneven, fix them.  If there are any pets around you, be aware of where they are.  Review your medicines with your doctor. Some medicines can make you feel dizzy. This can increase your chance of falling. Ask your doctor what other things that you can do to help prevent falls. This information is not intended to replace advice given to you by your health care provider. Make sure you discuss any questions you have with your health care provider. Document Released: 01/27/2009 Document Revised: 09/08/2015 Document Reviewed: 05/07/2014 Elsevier Interactive Patient Education  2017 [employer].

## 2016-08-16 NOTE — Progress Notes (Signed)
Quick Notes   Health Maintenance: Pn23 given today. Pt scheduling mmg. Foot exam due. Hep C and HGA1c due. Shingles vaccin prescription given.     Abnormal Screen: MMSE 29/30. Did not pass clock drawing.     Patient Concerns: None     Nurse Concerns: None

## 2016-08-17 LAB — HEMOGLOBIN A1C
Hgb A1c MFr Bld: 5.5 % (ref <5.7)
Mean Plasma Glucose: 111 mg/dL

## 2016-08-23 ENCOUNTER — Encounter: Payer: Medicare Other | Admitting: Nurse Practitioner

## 2016-08-23 ENCOUNTER — Ambulatory Visit: Payer: Medicare Other

## 2016-08-30 ENCOUNTER — Encounter: Payer: Self-pay | Admitting: Nurse Practitioner

## 2016-08-30 ENCOUNTER — Ambulatory Visit (INDEPENDENT_AMBULATORY_CARE_PROVIDER_SITE_OTHER): Payer: PPO | Admitting: Nurse Practitioner

## 2016-08-30 VITALS — BP 134/86 | HR 82 | Temp 98.1°F | Resp 18 | Ht 63.0 in | Wt 154.6 lb

## 2016-08-30 DIAGNOSIS — E119 Type 2 diabetes mellitus without complications: Secondary | ICD-10-CM | POA: Diagnosis not present

## 2016-08-30 DIAGNOSIS — Z0000 Encounter for general adult medical examination without abnormal findings: Secondary | ICD-10-CM | POA: Diagnosis not present

## 2016-08-30 DIAGNOSIS — I10 Essential (primary) hypertension: Secondary | ICD-10-CM | POA: Diagnosis not present

## 2016-08-30 DIAGNOSIS — H6121 Impacted cerumen, right ear: Secondary | ICD-10-CM

## 2016-08-30 LAB — POCT URINALYSIS DIPSTICK
Bilirubin, UA: NEGATIVE
Blood, UA: NEGATIVE
GLUCOSE UA: NEGATIVE
Ketones, UA: NEGATIVE
Leukocytes, UA: NEGATIVE
Nitrite, UA: NEGATIVE
Protein, UA: NEGATIVE
Spec Grav, UA: 1.02 (ref 1.010–1.025)
UROBILINOGEN UA: 1 U/dL
pH, UA: 5 (ref 5.0–8.0)

## 2016-08-30 NOTE — Progress Notes (Signed)
Provider: [name], NP  Patient Care Team: [name], NP as PCP - General (Nurse Practitioner)  Extended Emergency Contact Information Primary Emergency Contact: Aura Fey Address: [address]          [place]  [place] Home Phone: [phone] Relation: None Secondary Emergency Contact: Curlene Labrum Address: [address]          [place], [place] [address] Johnnette Litter of [place] Phone: [phone] Work Phone: [phone] Relation: Spouse Allergies  Allergen Reactions  . Codeine   . Sulfa Antibiotics    Code Status: FULL Goals of Care: Advanced Directive information Advanced Directives 08/30/2016  Does Patient Have a Medical Advance Directive? Yes  Type of Paramedic of [place];Living will  Does patient want to make changes to medical advance directive? -  Copy of [place] in Chart? No - copy requested  Would patient like information on creating a medical advance directive? -     Chief Complaint  Patient presents with  . Medical Management of Chronic Issues    Pt is being seen for complete physical and follow up on chronic conditions.    HPI: Patient is a 71 y.o. female seen in today for an annual wellness exam.   No major illnesses or hospitalization in the last year   Depression screen [facility] 2/9 08/16/2016 07/21/2015 03/04/2014 10/22/2013 07/24/2012  Decreased Interest 0 0 0 0 0  Down, Depressed, Hopeless 0 0 0 0 0  PHQ - 2 Score 0 0 0 0 0    Fall Risk  08/16/2016 07/21/2015 11/26/2014 03/04/2014 10/22/2013  Falls in the past year? No No No No No   MMSE - Mini Mental State Exam 08/16/2016  Orientation to time 5  Orientation to Place 5  Registration 3  Attention/ Calculation 5  Recall 2  Language- name 2 objects 2  Language- repeat 1  Language- follow 3 step command 3  Language- read & follow direction 1  Write a sentence 1  Copy design 1  Total score 29     Health Maintenance  Topic Date Due   . Hepatitis C Screening  [birth year]  . FOOT EXAM  11/26/2015  . URINE MICROALBUMIN  07/26/2016  . MAMMOGRAM  01/14/2017 (Originally 10/16/2015)  . INFLUENZA VACCINE  11/14/2016  . OPHTHALMOLOGY EXAM  01/14/2017  . HEMOGLOBIN A1C  02/16/2017  . TETANUS/TDAP  04/25/2019  . COLONOSCOPY  06/29/2026  . DEXA SCAN  Completed  . PNA vac Low Risk Adult  Completed    Urinary incontinence? none Diet?heart healthy  Ophthalmology: yearly  walk and dance daily for exercise, 2.5 hours on Sunday and dance Saturday night  Dentition:works for dentist so routinely Pain:none Dermatologist yearly  Past Medical History:  Diagnosis Date  . Acute bronchitis   . Disorder of bone and cartilage, unspecified   . Elevated blood pressure reading without diagnosis of hypertension   . Myalgia and myositis, unspecified   . Osteoporosis, unspecified   . Other abnormal blood chemistry   . Other and unspecified hyperlipidemia   . Plantar fascial fibromatosis   . Type II or unspecified type diabetes mellitus without mention of complication, not stated as uncontrolled     Past Surgical History:  Procedure Laterality Date  . BREAST SURGERY  1997   reduction  . EYE SURGERY     Left eye contracts removed    Social History   Social History  . Marital status: Married    Spouse  name: N/A  . Number of children: N/A  . Years of education: N/A   Social History Main Topics  . Smoking status: Never Smoker  . Smokeless tobacco: Never Used  . Alcohol use Yes     Comment: glass wine every day  . Drug use: No  . Sexual activity: Not Currently   Other Topics Concern  . None   Social History Narrative  . None    History reviewed. No pertinent family history.  Review of Systems:  Review of Systems  Constitutional: Negative for activity change, appetite change, chills, diaphoresis, fatigue and fever.  HENT: Negative for ear pain and sore throat.   Eyes: Negative for visual disturbance.  Respiratory:  Negative for cough, chest tightness and shortness of breath.   Cardiovascular: Negative for chest pain, palpitations and leg swelling.  Gastrointestinal: Negative for abdominal pain, blood in stool, constipation, diarrhea, nausea and vomiting.  Genitourinary: Negative for dysuria.  Musculoskeletal: Negative for arthralgias.  Neurological: Negative for dizziness, tremors, numbness and headaches.  Psychiatric/Behavioral: Negative for sleep disturbance. The patient is not nervous/anxious.      Allergies as of 08/30/2016      Reactions   Codeine    Sulfa Antibiotics       Medication List       Accurate as of 08/30/16  1:55 PM. Always use your most recent med list.          aspirin EC 81 MG tablet Take 1 tablet (81 mg total) by mouth daily.   calcium-vitamin D 500-200 MG-UNIT tablet Commonly known as:  OSCAL WITH D Take 1 tablet by mouth.   metFORMIN 500 MG tablet Commonly known as:  GLUCOPHAGE take 1 tablet by mouth once daily WITH BREAKFAST TO CONTROL SUGAR   simvastatin 20 MG tablet Commonly known as:  ZOCOR take 1 tablet by mouth every evening TO LOWER CHOLESTEROL         Physical Exam: Vitals:   08/30/16 1335  BP: 134/86  Pulse: 82  Resp: 18  Temp: 98.1 F (36.7 C)  TempSrc: Oral  SpO2: 96%  Weight: 154 lb 9.6 oz (70.1 kg)  Height: 5\' 3"  (1.6 m)   Body mass index is 27.39 kg/m. Physical Exam  Constitutional: She is oriented to person, place, and time. She appears well-developed and well-nourished. No distress.  HENT:  Head: Normocephalic and atraumatic.  Right Ear: External ear normal.  Left Ear: External ear normal.  Nose: Nose normal.  Mouth/Throat: Oropharynx is clear and moist. No oropharyngeal exudate.  Eyes: Conjunctivae and EOM are normal. Pupils are equal, round, and reactive to light.  Neck: Normal range of motion. Neck supple. No JVD present. No thyromegaly present.  Cardiovascular: Normal rate, regular rhythm, normal heart sounds and  intact distal pulses.   Pulmonary/Chest: Effort normal and breath sounds normal. No respiratory distress.  Breast exam by GYN  Abdominal: Soft. Bowel sounds are normal. She exhibits no distension. There is no tenderness.  Genitourinary:  Genitourinary Comments: By GYN  Musculoskeletal: Normal range of motion. She exhibits no edema or tenderness.  Neurological: She is alert and oriented to person, place, and time. She has normal reflexes. No cranial nerve deficit.  Skin: Skin is warm and dry. She is not diaphoretic.  Psychiatric: She has a normal mood and affect.   Labs reviewed: Basic Metabolic Panel:  Recent Labs  08/16/16 0854  NA 140  K 4.2  CL 106  CO2 24  GLUCOSE 100*  BUN 14  CREATININE  0.73  CALCIUM 9.4   Liver Function Tests:  Recent Labs  08/16/16 0854  AST 25  ALT 21  ALKPHOS 71  BILITOT 0.4  PROT 7.1  ALBUMIN 4.4   No results for input(s): LIPASE, AMYLASE in the last 8760 hours. No results for input(s): AMMONIA in the last 8760 hours. CBC:  Recent Labs  08/16/16 0854  WBC 4.9  NEUTROABS 2,156  HGB 12.2  HCT 37.1  MCV 88.1  PLT 321   Lipid Panel:  Recent Labs  08/16/16 0854  CHOL 154  HDL 39*  LDLCALC 91  TRIG 120  CHOLHDL 3.9   Lab Results  Component Value Date   HGBA1C 5.5 08/16/2016    Procedures: No results found.  Assessment/Plan 1. Essential hypertension, benign Blood pressure stable at this time. Not currently on medication. conts lifestly modifications - EKG 12-Lead- NSR rate 97  2. Controlled type 2 diabetes mellitus without complication, without long-term current use of insulin (HCC) A1c at goal.  Unable to tolerate lisinopril - Microalbumin/Creatinine Ratio, Urine  3. Wellness examination -she is doing well. There has been no changes since last visit. The patient was counseled regarding the appropriate use of alcohol, regular self-examination of the breasts on a monthly basis, prevention of dental and periodontal  disease, diet, regular sustained exercise for at least 30 minutes 5 times per week, routine screening interval for mammogram as recommended by the [place] and ACOG, importance of regular PAP smears,  and recommended schedule for GI hemoccult testing, colonoscopy, cholesterol, thyroid and diabetes screening. - POCT urinalysis dipstick- normal  4. Impacted cerumen of right ear - Ear Lavage done and loop curette used to remove cerumen, not all cerumen was able to be removed. To use debrox OTC and may return for wash after 4 days.    Carlos American. Harle Battiest  [facility] [phone] 8 am - 5 pm) [phone] (after hours)

## 2016-08-30 NOTE — Patient Instructions (Addendum)
Health Maintenance, Female Adopting a healthy lifestyle and getting preventive care can go a long way to promote health and wellness. Talk with your health care provider about what schedule of regular examinations is right for you. This is a good chance for you to check in with your provider about disease prevention and staying healthy. In between checkups, there are plenty of things you can do on your own. Experts have done a lot of research about which lifestyle changes and preventive measures are most likely to keep you healthy. Ask your health care provider for more information. Weight and diet Eat a healthy diet  Be sure to include plenty of vegetables, fruits, low-fat dairy products, and lean protein.  Do not eat a lot of foods high in solid fats, added sugars, or salt.  Get regular exercise. This is one of the most important things you can do for your health.  Most adults should exercise for at least 150 minutes each week. The exercise should increase your heart rate and make you sweat (moderate-intensity exercise).  Most adults should also do strengthening exercises at least twice a week. This is in addition to the moderate-intensity exercise. Maintain a healthy weight  Body mass index (BMI) is a measurement that can be used to identify possible weight problems. It estimates body fat based on height and weight. Your health care provider can help determine your BMI and help you achieve or maintain a healthy weight.  For females 86 years of age and older:  A BMI below 18.5 is considered underweight.  A BMI of 18.5 to 24.9 is normal.  A BMI of 25 to 29.9 is considered overweight.  A BMI of 30 and above is considered obese. Watch levels of cholesterol and blood lipids  You should start having your blood tested for lipids and cholesterol at 71 years of age, then have this test every 5 years.  You may need to have your cholesterol levels checked more often if:  Your lipid  or cholesterol levels are high.  You are older than 71 years of age.  You are at high risk for heart disease. Cancer screening Lung Cancer  Lung cancer screening is recommended for adults 85-43 years old who are at high risk for lung cancer because of a history of smoking.  A yearly low-dose CT scan of the lungs is recommended for people who:  Currently smoke.  Have quit within the past 15 years.  Have at least a 30-pack-year history of smoking. A pack year is smoking an average of one pack of cigarettes a day for 1 year.  Yearly screening should continue until it has been 15 years since you quit.  Yearly screening should stop if you develop a health problem that would prevent you from having lung cancer treatment. Breast Cancer  Practice breast self-awareness. This means understanding how your breasts normally appear and feel.  It also means doing regular breast self-exams. Let your health care provider know about any changes, no matter how small.  If you are in your 20s or 30s, you should have a clinical breast exam (CBE) by a health care provider every 1-3 years as part of a regular health exam.  If you are 7 or older, have a CBE every year. Also consider having a breast X-ray (mammogram) every year.  If you have a family history of breast cancer, talk to your health care provider about genetic screening.  If you are at high risk for  to your health care provider about having an MRI and a mammogram every year.  Breast cancer gene (BRCA) assessment is recommended for women who have family members with BRCA-related cancers. BRCA-related cancers include:  Breast.  Ovarian.  Tubal.  Peritoneal cancers.  Results of the assessment will determine the need for genetic counseling and BRCA1 and BRCA2 testing. Cervical Cancer  Your health care provider may recommend that you be screened regularly for cancer of the pelvic organs (ovaries, uterus, and vagina).  This screening involves a pelvic examination, including checking for microscopic changes to the surface of your cervix (Pap test). You may be encouraged to have this screening done every 3 years, beginning at age 21.  For women ages 30-65, health care providers may recommend pelvic exams and Pap testing every 3 years, or they may recommend the Pap and pelvic exam, combined with testing for human papilloma virus (HPV), every 5 years. Some types of HPV increase your risk of cervical cancer. Testing for HPV may also be done on women of any age with unclear Pap test results.  Other health care providers may not recommend any screening for nonpregnant women who are considered low risk for pelvic cancer and who do not have symptoms. Ask your health care provider if a screening pelvic exam is right for you.  If you have had past treatment for cervical cancer or a condition that could lead to cancer, you need Pap tests and screening for cancer for at least 20 years after your treatment. If Pap tests have been discontinued, your risk factors (such as having a new sexual partner) need to be reassessed to determine if screening should resume. Some women have medical problems that increase the chance of getting cervical cancer. In these cases, your health care provider may recommend more frequent screening and Pap tests. Colorectal Cancer  This type of cancer can be detected and often prevented.  Routine colorectal cancer screening usually begins at 71 years of age and continues through 71 years of age.  Your health care provider may recommend screening at an earlier age if you have risk factors for colon cancer.  Your health care provider may also recommend using home test kits to check for hidden blood in the stool.  A small camera at the end of a tube can be used to examine your colon directly (sigmoidoscopy or colonoscopy). This is done to check for the earliest forms of colorectal cancer.  Routine  screening usually begins at age 50.  Direct examination of the colon should be repeated every 5-10 years through 71 years of age. However, you may need to be screened more often if early forms of precancerous polyps or small growths are found. Skin Cancer  Check your skin from head to toe regularly.  Tell your health care provider about any new moles or changes in moles, especially if there is a change in a mole's shape or color.  Also tell your health care provider if you have a mole that is larger than the size of a pencil eraser.  Always use sunscreen. Apply sunscreen liberally and repeatedly throughout the day.  Protect yourself by wearing long sleeves, pants, a wide-brimmed hat, and sunglasses whenever you are outside. Heart disease, diabetes, and high blood pressure  High blood pressure causes heart disease and increases the risk of stroke. High blood pressure is more likely to develop in:  People who have blood pressure in the high end of the normal range (130-139/85-89 mm Hg).    People who are overweight or obese.  People who are African American.  If you are 18-39 years of age, have your blood pressure checked every 3-5 years. If you are 40 years of age or older, have your blood pressure checked every year. You should have your blood pressure measured twice-once when you are at a hospital or clinic, and once when you are not at a hospital or clinic. Record the average of the two measurements. To check your blood pressure when you are not at a hospital or clinic, you can use:  An automated blood pressure machine at a pharmacy.  A home blood pressure monitor.  If you are between 55 years and 79 years old, ask your health care provider if you should take aspirin to prevent strokes.  Have regular diabetes screenings. This involves taking a blood sample to check your fasting blood sugar level.  If you are at a normal weight and have a low risk for diabetes, have this test once  every three years after 71 years of age.  If you are overweight and have a high risk for diabetes, consider being tested at a younger age or more often. Preventing infection Hepatitis B  If you have a higher risk for hepatitis B, you should be screened for this virus. You are considered at high risk for hepatitis B if:  You were born in a country where hepatitis B is common. Ask your health care provider which countries are considered high risk.  Your parents were born in a high-risk country, and you have not been immunized against hepatitis B (hepatitis B vaccine).  You have HIV or AIDS.  You use needles to inject street drugs.  You live with someone who has hepatitis B.  You have had sex with someone who has hepatitis B.  You get hemodialysis treatment.  You take certain medicines for conditions, including cancer, organ transplantation, and autoimmune conditions. Hepatitis C  Blood testing is recommended for:  Everyone born from 1945 through 1965.  Anyone with known risk factors for hepatitis C. Sexually transmitted infections (STIs)  You should be screened for sexually transmitted infections (STIs) including gonorrhea and chlamydia if:  You are sexually active and are younger than 71 years of age.  You are older than 71 years of age and your health care provider tells you that you are at risk for this type of infection.  Your sexual activity has changed since you were last screened and you are at an increased risk for chlamydia or gonorrhea. Ask your health care provider if you are at risk.  If you do not have HIV, but are at risk, it may be recommended that you take a prescription medicine daily to prevent HIV infection. This is called pre-exposure prophylaxis (PrEP). You are considered at risk if:  You are sexually active and do not regularly use condoms or know the HIV status of your partner(s).  You take drugs by injection.  You are sexually active with a partner  who has HIV. Talk with your health care provider about whether you are at high risk of being infected with HIV. If you choose to begin PrEP, you should first be tested for HIV. You should then be tested every 3 months for as long as you are taking PrEP. Pregnancy  If you are premenopausal and you may become pregnant, ask your health care provider about preconception counseling.  If you may become pregnant, take 400 to 800 micrograms (mcg) of folic acid   every day.  If you want to prevent pregnancy, talk to your health care provider about birth control (contraception). Osteoporosis and menopause  Osteoporosis is a disease in which the bones lose minerals and strength with aging. This can result in serious bone fractures. Your risk for osteoporosis can be identified using a bone density scan.  If you are 65 years of age or older, or if you are at risk for osteoporosis and fractures, ask your health care provider if you should be screened.  Ask your health care provider whether you should take a calcium or vitamin D supplement to lower your risk for osteoporosis.  Menopause may have certain physical symptoms and risks.  Hormone replacement therapy may reduce some of these symptoms and risks. Talk to your health care provider about whether hormone replacement therapy is right for you. Follow these instructions at home:  Schedule regular health, dental, and eye exams.  Stay current with your immunizations.  Do not use any tobacco products including cigarettes, chewing tobacco, or electronic cigarettes.  If you are pregnant, do not drink alcohol.  If you are breastfeeding, limit how much and how often you drink alcohol.  Limit alcohol intake to no more than 1 drink per day for nonpregnant women. One drink equals 12 ounces of beer, 5 ounces of wine, or 1 ounces of hard liquor.  Do not use street drugs.  Do not share needles.  Ask your health care provider for help if you need support  or information about quitting drugs.  Tell your health care provider if you often feel depressed.  Tell your health care provider if you have ever been abused or do not feel safe at home. This information is not intended to replace advice given to you by your health care provider. Make sure you discuss any questions you have with your health care provider. Document Released: 10/16/2010 Document Revised: 09/08/2015 Document Reviewed: 01/04/2015 Elsevier Interactive Patient Education  2017 [employer]  

## 2016-08-31 LAB — MICROALBUMIN / CREATININE URINE RATIO
CREATININE, URINE: 121 mg/dL (ref 20–320)
MICROALB UR: 0.6 mg/dL
Microalb Creat Ratio: 5 mcg/mg creat (ref <30)

## 2016-09-06 NOTE — Addendum Note (Signed)
Addended by: [name] on: 09/06/2016 03:37 PM   Modules accepted: Level of Service

## 2016-10-07 ENCOUNTER — Emergency Department (HOSPITAL_COMMUNITY): Payer: PPO

## 2016-10-07 ENCOUNTER — Encounter (HOSPITAL_COMMUNITY): Payer: Self-pay | Admitting: Emergency Medicine

## 2016-10-07 ENCOUNTER — Emergency Department (HOSPITAL_COMMUNITY)
Admission: EM | Admit: 2016-10-07 | Discharge: 2016-10-07 | Disposition: A | Discharge status: Home / Self Care | Payer: PPO | Attending: Emergency Medicine | Admitting: Emergency Medicine

## 2016-10-07 DIAGNOSIS — I10 Essential (primary) hypertension: Secondary | ICD-10-CM | POA: Insufficient documentation

## 2016-10-07 DIAGNOSIS — S92352A Displaced fracture of fifth metatarsal bone, left foot, initial encounter for closed fracture: Secondary | ICD-10-CM | POA: Insufficient documentation

## 2016-10-07 DIAGNOSIS — W1839XA Other fall on same level, initial encounter: Secondary | ICD-10-CM | POA: Diagnosis not present

## 2016-10-07 DIAGNOSIS — T07XXXA Unspecified multiple injuries, initial encounter: Secondary | ICD-10-CM

## 2016-10-07 DIAGNOSIS — Z23 Encounter for immunization: Secondary | ICD-10-CM | POA: Insufficient documentation

## 2016-10-07 DIAGNOSIS — Y929 Unspecified place or not applicable: Secondary | ICD-10-CM | POA: Insufficient documentation

## 2016-10-07 DIAGNOSIS — Z7982 Long term (current) use of aspirin: Secondary | ICD-10-CM | POA: Diagnosis not present

## 2016-10-07 DIAGNOSIS — Y939 Activity, unspecified: Secondary | ICD-10-CM | POA: Diagnosis not present

## 2016-10-07 DIAGNOSIS — S80811A Abrasion, right lower leg, initial encounter: Secondary | ICD-10-CM | POA: Diagnosis not present

## 2016-10-07 DIAGNOSIS — E119 Type 2 diabetes mellitus without complications: Secondary | ICD-10-CM | POA: Diagnosis not present

## 2016-10-07 DIAGNOSIS — Y999 Unspecified external cause status: Secondary | ICD-10-CM | POA: Insufficient documentation

## 2016-10-07 DIAGNOSIS — S99922A Unspecified injury of left foot, initial encounter: Secondary | ICD-10-CM | POA: Diagnosis not present

## 2016-10-07 DIAGNOSIS — S92902A Unspecified fracture of left foot, initial encounter for closed fracture: Secondary | ICD-10-CM

## 2016-10-07 MED ORDER — TRAMADOL HCL 50 MG PO TABS: 50.0000 mg | ORAL_TABLET | Freq: Four times a day (QID) | ORAL | 0 refills | Status: DC | PRN

## 2016-10-07 MED ORDER — TETANUS-DIPHTH-ACELL PERTUSSIS 5-2.5-18.5 LF-MCG/0.5 IM SUSP
0.5000 mL | Freq: Once | INTRAMUSCULAR | Status: AC
  Administered 2016-10-07: 0.5 mL via INTRAMUSCULAR
  Filled 2016-10-07: qty 0.5

## 2016-10-07 NOTE — ED Provider Notes (Signed)
[facility] DEPT Provider Note   CSN: [id number] Arrival date & time: 10/07/16  1437  By signing my name below, I, Jasmine James, attest that this documentation has been prepared under the direction and in the presence of [facility] PA-C.  Electronically Signed: Ephriam James, ED Scribe. 10/07/16. 4:10 PM.  History   Chief Complaint Chief Complaint  Patient presents with  . Fall  . Foot Pain    HPI Jasmine James is a 71 y.o. female with Hx of Osteopetrosis, who presents to the Emergency Department complaining of constant, dull left foot pain s/p a mechanical fall that occurred just prior to arrival. Pt turned her head to look at a deer and stepped off the walking path which caused her to trip. She denies any head injury or LOC. Pt is able to bear weight on the extremity and ambulate but with increased difficulty secondary to pain. Pt also has superficial abrasions noted to her right knee and shin which she had no significant pain from. She is unsure if her Tetanus is UTD. No pain to her left ankle. No pain to her right foot or ankle. Pt does not have a current orthopedist. She denies taking any blood thinners.  The history is provided by the patient. No language interpreter was used.    Past Medical History:  Diagnosis Date  . Acute bronchitis   . Disorder of bone and cartilage, unspecified   . Elevated blood pressure reading without diagnosis of hypertension   . Myalgia and myositis, unspecified   . Osteoporosis, unspecified   . Other abnormal blood chemistry   . Other and unspecified hyperlipidemia   . Plantar fascial fibromatosis   . Type II or unspecified type diabetes mellitus without mention of complication, not stated as uncontrolled     Patient Active Problem List   Diagnosis Date Noted  . Controlled type 2 diabetes mellitus without complication, without long-term current use of insulin ([place]) 07/21/2015  . Hyperlipidemia LDL goal <70 07/21/2015  . Essential  hypertension, benign 07/21/2015  . Osteopenia 10/25/2013  . Muscle pain 07/24/2012    Past Surgical History:  Procedure Laterality Date  . BREAST SURGERY  1997   reduction  . EYE SURGERY     Left eye contracts removed    OB History    No data available      Home Medications    Prior to Admission medications   Medication Sig Start Date End Date Taking? Authorizing Provider  aspirin EC 81 MG tablet Take 1 tablet (81 mg total) by mouth daily. Patient not taking: Reported on 08/30/2016 07/21/15   Hollace Kinnier L, DO  calcium-vitamin D (OSCAL WITH D) 500-200 MG-UNIT tablet Take 1 tablet by mouth.    [provider]  metFORMIN (GLUCOPHAGE) 500 MG tablet take 1 tablet by mouth once daily WITH BREAKFAST TO CONTROL SUGAR 08/16/16   [name], NP  simvastatin (ZOCOR) 20 MG tablet take 1 tablet by mouth every evening TO LOWER CHOLESTEROL 08/16/16   [name], NP  traMADol (ULTRAM) 50 MG tablet Take 1 tablet (50 mg total) by mouth every 6 (six) hours as needed for moderate pain or severe pain. 10/07/16   Clayton Bibles, PA-C   Family History History reviewed. No pertinent family history.  Social History Social History  Substance Use Topics  . Smoking status: Never Smoker  . Smokeless tobacco: Never Used  . Alcohol use Yes     Comment: glass wine every day  Allergies   Codeine and Sulfa antibiotics   Review of Systems Review of Systems  Constitutional: Negative for activity change.  HENT: Negative for facial swelling (no head injury).   Cardiovascular: Negative for leg swelling.  Musculoskeletal: Positive for arthralgias (right foot) and gait problem (secondary to pain).  Skin: Positive for wound.  Neurological: Negative for dizziness, syncope and headaches.  Hematological: Does not bruise/bleed easily.  Psychiatric/Behavioral: Negative for self-injury.     Physical Exam Updated Vital Signs BP (!) 164/89 (BP Location: Right Arm) Comment: Pt was getting  an IM injection at time of reading.  Pt has anxiety about needles.  Pulse 80   Temp 98.5 F (36.9 C) (Oral)   Resp 18   SpO2 96%   Physical Exam  Constitutional: She appears well-developed and well-nourished. No distress.  HENT:  Head: Normocephalic and atraumatic.  Neck: Neck supple.  Pulmonary/Chest: Effort normal.  Musculoskeletal: She exhibits edema and tenderness. She exhibits no deformity.  Multiple abrasions without bony tenderness over the right lower leg. Left foot with edema and tenderness over the dorsal foot overlying the proximal 5th metatarsal. Pt able to bear weight.  Neurological: She is alert.  Skin: She is not diaphoretic.  Nursing note and vitals reviewed.    ED Treatments / Results  DIAGNOSTIC STUDIES: Oxygen Saturation is 95% on RA, normal by my interpretation.  COORDINATION OF CARE: 4:40 PM-Discussed treatment plan with pt at bedside and pt agreed to plan.   Labs (all labs ordered are listed, but only abnormal results are displayed) Labs Reviewed - No data to display  EKG  EKG Interpretation None       Radiology Dg Foot Complete Left  Result Date: 10/07/2016 CLINICAL DATA:  Fall today stepping off curb with lateral pain. EXAM: LEFT FOOT - COMPLETE 3+ VIEW COMPARISON:  None. FINDINGS: Examination demonstrates a very minimally displaced fracture involving the base of the fifth metatarsal. Minimal degenerative changes over the first MTP joint and interphalangeal joints as well as midfoot. IMPRESSION: Minimally displaced fracture of the base of the fifth metatarsal. Electronically Signed   By: Marin Olp M.D.   On: 10/07/2016 15:40    Procedures Procedures (including critical care time)  Medications Ordered in ED Medications  Tdap (BOOSTRIX) injection 0.5 mL (0.5 mLs Intramuscular Given 10/07/16 1726)     Initial Impression / Assessment and Plan / ED Course  I have reviewed the triage vital signs and the nursing notes.  Pertinent labs &  imaging results that were available during my care of the patient were reviewed by me and considered in my medical decision making (see chart for details).    Afebrile, nontoxic patient with injury to her left foot while accidentally stepping off a trail while walking.   Xray demonstrates fracture at base of 5th metatarsal.  Discussed pt and reviewed films with Dr Ellender Hose, discussed plan.   D/C home with short leg posterior splint, crutches, tramadol, orthopedic follow up.  Discussed result, findings, treatment, and follow up  with patient.  Pt given return precautions.  Pt verbalizes understanding and agrees with plan.      Final Clinical Impressions(s) / ED Diagnoses   Final diagnoses:  Foot fracture, left, closed, initial encounter  Multiple abrasions    New Prescriptions Discharge Medication List as of 10/07/2016  5:37 PM    START taking these medications   Details  traMADol (ULTRAM) 50 MG tablet Take 1 tablet (50 mg total) by mouth every 6 (six) hours as needed  for moderate pain or severe pain., Starting Sun 10/07/2016, Print       I personally performed the services described in this documentation, which was scribed in my presence. The recorded information has been reviewed and is accurate.    Clayton Bibles, [place] 10/07/16 1936    Duffy Bruce, MD 10/08/16 [phone]

## 2016-10-07 NOTE — ED Notes (Signed)
Pt presents with a foot pain.  Pt states she is unable to ambulated on foot.  No obvious injury noted.

## 2016-10-07 NOTE — ED Triage Notes (Signed)
Pt tripped and fell today while walking. Has abrasions on R leg and elbow. Worst pain is in L foot.

## 2016-10-07 NOTE — ED Notes (Signed)
Ortho Tech at bedside.  

## 2016-10-07 NOTE — Discharge Instructions (Signed)
Read the information below.  Use the prescribed medication as directed.  Please discuss all new medications with your pharmacist.  You may return to the Emergency Department at any time for worsening condition or any new symptoms that concern you.   If you develop uncontrolled pain, weakness or numbness of the extremity, severe discoloration of the skin, or you are unable to move your toes, return to the ER for a recheck.     Please be very cautious while using the crutches.  If the pain medication makes you woozy, be careful to take precautions to avoid falls.

## 2016-10-08 ENCOUNTER — Encounter (INDEPENDENT_AMBULATORY_CARE_PROVIDER_SITE_OTHER): Payer: Self-pay | Admitting: Orthopaedic Surgery

## 2016-10-08 ENCOUNTER — Ambulatory Visit (INDEPENDENT_AMBULATORY_CARE_PROVIDER_SITE_OTHER): Payer: PPO | Admitting: Orthopaedic Surgery

## 2016-10-08 DIAGNOSIS — S92355A Nondisplaced fracture of fifth metatarsal bone, left foot, initial encounter for closed fracture: Secondary | ICD-10-CM | POA: Diagnosis not present

## 2016-10-08 NOTE — Progress Notes (Signed)
Office Visit Note   Patient: Jasmine James           Date of Birth: April 15, 1946           MRN: [id number] Visit Date: 10/08/2016              Requested by: [name], NP [place], [place] [address] PCP: [name], NP   Assessment & Plan: Visit Diagnoses:  1. Closed nondisplaced fracture of fifth metatarsal bone of left foot, initial encounter     Plan: Patient has a nondisplaced fifth metatarsal fracture. Weight-bear as tolerated in a cam walker. Handicap plaque or provided. Follow-up in 6 weeks for three-view x-rays of the left foot. May return to work when patient is comfortable  Follow-Up Instructions: Return in about 6 weeks (around 11/19/2016).   Orders:  No orders of the defined types were placed in this encounter.  No orders of the defined types were placed in this encounter.     Procedures: No procedures performed   Clinical Data: No additional findings.   Subjective: Chief Complaint  Patient presents with  . Left Foot - Injury    Jasmine James is a 71 year old who sustained a left foot injury yesterday from this [place]. She endorses swelling and moderate pain. She was seen at the ER and placed in a splint. She follows up today. Pain does not radiate.    Review of Systems  Constitutional: Negative.   HENT: Negative.   Eyes: Negative.   Respiratory: Negative.   Cardiovascular: Negative.   Endocrine: Negative.   Musculoskeletal: Negative.   Neurological: Negative.   Hematological: Negative.   Psychiatric/Behavioral: Negative.   All other systems reviewed and are negative.    Objective: Vital Signs: There were no vitals taken for this visit.  Physical Exam  Constitutional: She is oriented to person, place, and time. She appears well-developed and well-nourished.  HENT:  Head: Normocephalic and atraumatic.  Eyes: EOM are normal.  Neck: Neck supple.  Pulmonary/Chest: Effort normal.    Abdominal: Soft.  Neurological: She is alert and oriented to person, place, and time.  Skin: Skin is warm. Capillary refill takes less than 2 seconds.  Psychiatric: She has a normal mood and affect. Her behavior is normal. Judgment and thought content normal.  Nursing note and vitals reviewed.   Ortho Exam Left foot exam shows moderate swelling. She does have ecchymosis. No open lesions. Foot is warm well-perfused Erasmo intact. Specialty Comments:  No specialty comments available.  Imaging: Dg Foot Complete Left  Result Date: 10/07/2016 CLINICAL DATA:  Fall today stepping off curb with lateral pain. EXAM: LEFT FOOT - COMPLETE 3+ VIEW COMPARISON:  None. FINDINGS: Examination demonstrates a very minimally displaced fracture involving the base of the fifth metatarsal. Minimal degenerative changes over the first MTP joint and interphalangeal joints as well as midfoot. IMPRESSION: Minimally displaced fracture of the base of the fifth metatarsal. Electronically Signed   By: Marin Olp M.D.   On: 10/07/2016 15:40     PMFS History: Patient Active Problem List   Diagnosis Date Noted  . Closed nondisplaced fracture of fifth left metatarsal bone 10/08/2016  . Controlled type 2 diabetes mellitus without complication, without long-term current use of insulin ([place]) 07/21/2015  . Hyperlipidemia LDL goal <70 07/21/2015  . Essential hypertension, benign 07/21/2015  . Osteopenia 10/25/2013  . Muscle pain 07/24/2012   Past Medical History:  Diagnosis Date  . Acute  bronchitis   . Disorder of bone and cartilage, unspecified   . Elevated blood pressure reading without diagnosis of hypertension   . Myalgia and myositis, unspecified   . Osteoporosis, unspecified   . Other abnormal blood chemistry   . Other and unspecified hyperlipidemia   . Plantar fascial fibromatosis   . Type II or unspecified type diabetes mellitus without mention of complication, not stated as uncontrolled     No family  history on file.  Past Surgical History:  Procedure Laterality Date  . BREAST SURGERY  1997   reduction  . EYE SURGERY     Left eye contracts removed   Social History   Occupational History  . Not on file.   Social History Main Topics  . Smoking status: Never Smoker  . Smokeless tobacco: Never Used  . Alcohol use Yes     Comment: glass wine every day  . Drug use: No  . Sexual activity: Not Currently

## 2016-10-26 ENCOUNTER — Ambulatory Visit (INDEPENDENT_AMBULATORY_CARE_PROVIDER_SITE_OTHER): Payer: PPO

## 2016-10-26 ENCOUNTER — Ambulatory Visit (INDEPENDENT_AMBULATORY_CARE_PROVIDER_SITE_OTHER): Payer: PPO | Admitting: Orthopaedic Surgery

## 2016-10-26 DIAGNOSIS — S92355D Nondisplaced fracture of fifth metatarsal bone, left foot, subsequent encounter for fracture with routine healing: Secondary | ICD-10-CM | POA: Diagnosis not present

## 2016-10-26 NOTE — Progress Notes (Signed)
Jasmine James is almost 3 weeks status post left fifth metatarsal fracture. She is doing well. She does still have global tenderness with palpation. Overall she is doing well. She she has no pain with a cam walker. Her x-ray show stable alignment of the fracture. At this point will convert to a postop shoe. Follow-up in 5 weeks with review x-rays of left foot.

## 2016-12-06 ENCOUNTER — Encounter (INDEPENDENT_AMBULATORY_CARE_PROVIDER_SITE_OTHER): Payer: Self-pay | Admitting: Orthopaedic Surgery

## 2016-12-06 ENCOUNTER — Ambulatory Visit (INDEPENDENT_AMBULATORY_CARE_PROVIDER_SITE_OTHER): Payer: PPO | Admitting: Orthopaedic Surgery

## 2016-12-06 ENCOUNTER — Ambulatory Visit (INDEPENDENT_AMBULATORY_CARE_PROVIDER_SITE_OTHER): Payer: PPO

## 2016-12-06 DIAGNOSIS — Z01419 Encounter for gynecological examination (general) (routine) without abnormal findings: Secondary | ICD-10-CM | POA: Diagnosis not present

## 2016-12-06 DIAGNOSIS — S92355D Nondisplaced fracture of fifth metatarsal bone, left foot, subsequent encounter for fracture with routine healing: Secondary | ICD-10-CM

## 2016-12-06 DIAGNOSIS — Z1231 Encounter for screening mammogram for malignant neoplasm of breast: Secondary | ICD-10-CM | POA: Diagnosis not present

## 2016-12-06 DIAGNOSIS — Z6827 Body mass index (BMI) 27.0-27.9, adult: Secondary | ICD-10-CM | POA: Diagnosis not present

## 2016-12-06 LAB — HM MAMMOGRAPHY

## 2016-12-06 NOTE — Progress Notes (Signed)
Patient is a week status post fifth metatarsal fracture. She did well. She has no symptoms. Exam is benign. She has no tenderness palpation. X-rays demonstrate a healed fifth metatarsal base fracture. At this point patient is to increase activity as tolerated. She will wear as tolerated. Questions encouraged and answered. Follow-up as needed.

## 2017-02-14 DIAGNOSIS — M8588 Other specified disorders of bone density and structure, other site: Secondary | ICD-10-CM | POA: Diagnosis not present

## 2017-02-14 DIAGNOSIS — N958 Other specified menopausal and perimenopausal disorders: Secondary | ICD-10-CM | POA: Diagnosis not present

## 2017-02-14 LAB — HM DEXA SCAN

## 2017-02-14 LAB — VITAMIN D 25 HYDROXY (VIT D DEFICIENCY, FRACTURES): Vit D, 25-Hydroxy: 40

## 2017-02-22 ENCOUNTER — Ambulatory Visit (INDEPENDENT_AMBULATORY_CARE_PROVIDER_SITE_OTHER): Payer: PPO

## 2017-02-22 ENCOUNTER — Ambulatory Visit (INDEPENDENT_AMBULATORY_CARE_PROVIDER_SITE_OTHER): Payer: PPO | Admitting: Orthopaedic Surgery

## 2017-02-22 ENCOUNTER — Encounter (INDEPENDENT_AMBULATORY_CARE_PROVIDER_SITE_OTHER): Payer: Self-pay | Admitting: Orthopaedic Surgery

## 2017-02-22 DIAGNOSIS — M25561 Pain in right knee: Secondary | ICD-10-CM

## 2017-02-22 MED ORDER — METHYLPREDNISOLONE ACETATE 40 MG/ML IJ SUSP
40.0000 mg | INTRAMUSCULAR | Status: AC | PRN
  Administered 2017-02-22: 40 mg via INTRA_ARTICULAR

## 2017-02-22 MED ORDER — LIDOCAINE HCL 1 % IJ SOLN
2.0000 mL | INTRAMUSCULAR | Status: AC | PRN
  Administered 2017-02-22: 2 mL

## 2017-02-22 MED ORDER — BUPIVACAINE HCL 0.5 % IJ SOLN
2.0000 mL | INTRAMUSCULAR | Status: AC | PRN
  Administered 2017-02-22: 2 mL via INTRA_ARTICULAR

## 2017-02-22 NOTE — Progress Notes (Signed)
Office Visit Note   Patient: Jasmine James           Date of Birth: [birth year]           MRN: [id number] Visit Date: 02/22/2017              Requested by: [name], NP [place], [place] [address] PCP: [name], NP   Assessment & Plan: Visit Diagnoses:  1. Acute pain of right knee     Plan: Impression is right knee osteoarthritis flare.  Cortisone injection was performed today.  NSAIDs as needed.  Questions encouraged and answered.  Follow-up as needed.  Follow-Up Instructions: Return if symptoms worsen or fail to improve.   Orders:  Orders Placed This Encounter  Procedures  . XR KNEE 3 VIEW RIGHT   No orders of the defined types were placed in this encounter.     Procedures: Large Joint Inj: R knee on 02/22/2017 9:08 PM Indications: pain Details: 22 G needle  Arthrogram: No  Medications: 40 mg methylPREDNISolone acetate 40 MG/ML; 2 mL lidocaine 1 %; 2 mL bupivacaine 0.5 % Consent was given by the patient. Patient was prepped and draped in the usual sterile fashion.       Clinical Data: No additional findings.   Subjective: Chief Complaint  Patient presents with  . Right Knee - Pain    Patient is a 71 year old female who comes in with 2-week history of insidious onset right knee pain.  She endorses cracking and popping and feels like he wants to give out.  Difficulty with flexing her knee.  Denies any numbness and tingling.  The pain is worse with activity and prolonged standing and better with rest.  Denies any radiation of pain.    Review of Systems  Constitutional: Negative.   HENT: Negative.   Eyes: Negative.   Respiratory: Negative.   Cardiovascular: Negative.   Endocrine: Negative.   Musculoskeletal: Negative.   Neurological: Negative.   Hematological: Negative.   Psychiatric/Behavioral: Negative.   All other systems reviewed and are negative.    Objective: Vital Signs: There were no vitals taken for  this visit.  Physical Exam  Constitutional: She is oriented to person, place, and time. She appears well-developed and well-nourished.  Pulmonary/Chest: Effort normal.  Neurological: She is alert and oriented to person, place, and time.  Skin: Skin is warm. Capillary refill takes less than 2 seconds.  Psychiatric: She has a normal mood and affect. Her behavior is normal. Judgment and thought content normal.  Nursing note and vitals reviewed.   Ortho Exam Right knee exam shows no joint effusion.  Collaterals and cruciates are stable. Specialty Comments:  No specialty comments available.  Imaging: Xr Knee 3 View Right  Result Date: 02/22/2017 Mild osteoarthritis    PMFS History: Patient Active Problem List   Diagnosis Date Noted  . Closed nondisplaced fracture of fifth left metatarsal bone 10/08/2016  . Controlled type 2 diabetes mellitus without complication, without long-term current use of insulin ([place]) 07/21/2015  . Hyperlipidemia LDL goal <70 07/21/2015  . Essential hypertension, benign 07/21/2015  . Osteopenia 10/25/2013  . Muscle pain 07/24/2012   Past Medical History:  Diagnosis Date  . Acute bronchitis   . Disorder of bone and cartilage, unspecified   . Elevated blood pressure reading without diagnosis of hypertension   . Myalgia and myositis, unspecified   . Osteoporosis, unspecified   . Other abnormal blood chemistry   . Other and  unspecified hyperlipidemia   . Plantar fascial fibromatosis   . Type II or unspecified type diabetes mellitus without mention of complication, not stated as uncontrolled     History reviewed. No pertinent family history.  Past Surgical History:  Procedure Laterality Date  . BREAST SURGERY  1997   reduction  . EYE SURGERY     Left eye contracts removed   Social History   Occupational History  . Not on file  Tobacco Use  . Smoking status: Never Smoker  . Smokeless tobacco: Never Used  Substance and Sexual Activity  .  Alcohol use: Yes    Comment: glass wine every day  . Drug use: No  . Sexual activity: Not Currently

## 2017-03-14 ENCOUNTER — Ambulatory Visit (INDEPENDENT_AMBULATORY_CARE_PROVIDER_SITE_OTHER): Payer: PPO | Admitting: Nurse Practitioner

## 2017-03-14 ENCOUNTER — Encounter: Payer: Self-pay | Admitting: Nurse Practitioner

## 2017-03-14 VITALS — BP 138/92 | HR 73 | Temp 98.5°F | Resp 17 | Ht 63.0 in | Wt 151.0 lb

## 2017-03-14 DIAGNOSIS — E785 Hyperlipidemia, unspecified: Secondary | ICD-10-CM

## 2017-03-14 DIAGNOSIS — I10 Essential (primary) hypertension: Secondary | ICD-10-CM | POA: Diagnosis not present

## 2017-03-14 DIAGNOSIS — E119 Type 2 diabetes mellitus without complications: Secondary | ICD-10-CM

## 2017-03-14 DIAGNOSIS — M1990 Unspecified osteoarthritis, unspecified site: Secondary | ICD-10-CM

## 2017-03-14 DIAGNOSIS — M8580 Other specified disorders of bone density and structure, unspecified site: Secondary | ICD-10-CM

## 2017-03-14 NOTE — Patient Instructions (Addendum)
Recommended to take caltrate with D 600/400 twice a day. -- this is OTC  Weight bearing exercises   Cont to eat foods high in calcium   Don't forget your eye exam.   Please remind [facility] to send [place] your records when you have labs done.

## 2017-03-14 NOTE — Progress Notes (Signed)
Careteam: Patient Care Team: [name], NP as PCP - General (Nurse Practitioner)  Advanced Directive information    Allergies  Allergen Reactions  . Codeine   . Sulfa Antibiotics     Chief Complaint  Patient presents with  . Acute Visit    Pt is being seen to discuss bone density that was done by [facility]. report shows osteopenia. Pt was advised to take vitamin D supplement.      HPI: Patient is a 71 y.o. female seen in the office today to discuss bone density  Pt with hx of diabetes, hyperlipidemia, elevated blood pressure, osteopenia.  [facility] and here today to discuss results. She has brought her report with her which shows osteopenia.  Did not tolerate calcium in the past- had diarrhea.  Was on a big dose of vit D at some point. Now taking Vit D 2000 units.  Recommended to take caltrate with D 600/400 twice a day.  Recently had mammogram and colonoscopy.  Had a1c done recently there as well.  Reports blood pressure is lower at home around 100-120/70; always elevated in doctors office.  Went to dermatologist and had a skin assessment done which was normal.   Review of Systems:  Review of Systems  Constitutional: Negative for chills, fever and malaise/fatigue.  HENT: Negative for congestion and hearing loss.   Eyes: Negative for blurred vision.  Respiratory: Negative for cough and shortness of breath.   Cardiovascular: Negative for chest pain and leg swelling.  Gastrointestinal: Negative for blood in stool, constipation, heartburn and melena.  Genitourinary: Negative for dysuria, frequency and urgency.  Musculoskeletal: Positive for joint pain. Negative for falls and myalgias.  Skin: Negative for rash.  Neurological: Negative for dizziness, loss of consciousness and weakness.  Psychiatric/Behavioral: Negative for depression and memory loss.    Past Medical History:  Diagnosis Date  .  Acute bronchitis   . Disorder of bone and cartilage, unspecified   . Elevated blood pressure reading without diagnosis of hypertension   . Myalgia and myositis, unspecified   . Osteoporosis, unspecified   . Other abnormal blood chemistry   . Other and unspecified hyperlipidemia   . Plantar fascial fibromatosis   . Type II or unspecified type diabetes mellitus without mention of complication, not stated as uncontrolled    Past Surgical History:  Procedure Laterality Date  . BREAST SURGERY  1997   reduction  . EYE SURGERY     Left eye contracts removed   Social History:   reports that  has never smoked. she has never used smokeless tobacco. She reports that she drinks alcohol. She reports that she does not use drugs.  History reviewed. No pertinent family history.  Medications:   Medication List        Accurate as of 03/14/17  8:54 AM. Always use your most recent med list.          cholecalciferol 1000 units tablet Commonly known as:  VITAMIN D   metFORMIN 500 MG tablet Commonly known as:  GLUCOPHAGE take 1 tablet by mouth once daily WITH BREAKFAST TO CONTROL SUGAR   simvastatin 20 MG tablet Commonly known as:  ZOCOR take 1 tablet by mouth every evening TO LOWER CHOLESTEROL   TYLENOL 8 HOUR ARTHRITIS PAIN 650 MG CR tablet Generic drug:  acetaminophen        Physical Exam:  Vitals:   03/14/17 0839  BP: (!) 138/92  Pulse: 73  Resp: 17  Temp: 98.5 F (36.9 C)  TempSrc: Oral  SpO2: 98%  Weight: 151 lb (68.5 kg)  Height: _0  (1.6 m)   Body mass index is 26.75 kg/m.  Physical Exam  Constitutional: She is oriented to person, place, and time. She appears well-developed and well-nourished. No distress.  HENT:  Head: Normocephalic and atraumatic.  Mouth/Throat: Oropharynx is clear and moist. No oropharyngeal exudate.  Eyes: Conjunctivae are normal. Pupils are equal, round, and reactive to light.  Neck: Normal range of motion. Neck supple.    Cardiovascular: Normal rate, regular rhythm and normal heart sounds.  Pulmonary/Chest: Effort normal and breath sounds normal.  Abdominal: Soft. Bowel sounds are normal.  Musculoskeletal: She exhibits no edema or tenderness.  Neurological: She is alert and oriented to person, place, and time.  Skin: Skin is warm and dry. She is not diaphoretic.  Psychiatric: She has a normal mood and affect.    Labs reviewed: Basic Metabolic Panel: Recent Labs    08/16/16 0854  NA 140  K 4.2  CL 106  CO2 24  GLUCOSE 100*  BUN 14  CREATININE 0.73  CALCIUM 9.4   Liver Function Tests: Recent Labs    08/16/16 0854  AST 25  ALT 21  ALKPHOS 71  BILITOT 0.4  PROT 7.1  ALBUMIN 4.4   No results for input(s): LIPASE, AMYLASE in the last 8760 hours. No results for input(s): AMMONIA in the last 8760 hours. CBC: Recent Labs    08/16/16 0854  WBC 4.9  NEUTROABS 2,156  HGB 12.2  HCT 37.1  MCV 88.1  PLT 321   Lipid Panel: Recent Labs    08/16/16 0854  CHOL 154  HDL 39*  LDLCALC 91  TRIG 120  CHOLHDL 3.9   TSH: No results for input(s): TSH in the last 8760 hours. A1C: Lab Results  Component Value Date   HGBA1C 5.5 08/16/2016     Assessment/Plan 1. Controlled type 2 diabetes mellitus without complication, without long-term current use of insulin ([facility]) A1c at goal in May, states she recently had A1c done by GYN office and will have them send [place] labs, cont current regimen - Hemoglobin A1c; Future  2. Osteopenia, unspecified location -Recommended to take caltrate with D 600/400 twice a day with weight bearing activity. Plans to go back to GYN for Vit D level.  3. Hyperlipidemia LDL goal <70 -currently on Zocor, will follow up lipids prior to next appt - CMP with eGFR; Future - Lipid Panel; Future  4. Osteoarthritis, unspecified osteoarthritis type, unspecified site -cont on tylenol as needed for pain with strength training exercises   5. Essential hypertension,  benign -stable at this time, cont diet modifications.  - CBC with Differential/Platelets; Future  Next appt: 6 months with fasting lab work prior to visit Edwyn Inclan K. Harle Battiest  [facility] [phone] 8 am - 5 pm) [phone] (after hours)

## 2017-04-20 DIAGNOSIS — H6121 Impacted cerumen, right ear: Secondary | ICD-10-CM | POA: Diagnosis not present

## 2017-04-25 DIAGNOSIS — M859 Disorder of bone density and structure, unspecified: Secondary | ICD-10-CM | POA: Diagnosis not present

## 2017-05-03 DIAGNOSIS — H6691 Otitis media, unspecified, right ear: Secondary | ICD-10-CM | POA: Diagnosis not present

## 2017-05-23 DIAGNOSIS — H26492 Other secondary cataract, left eye: Secondary | ICD-10-CM | POA: Diagnosis not present

## 2017-05-23 DIAGNOSIS — E119 Type 2 diabetes mellitus without complications: Secondary | ICD-10-CM | POA: Diagnosis not present

## 2017-05-23 DIAGNOSIS — H04123 Dry eye syndrome of bilateral lacrimal glands: Secondary | ICD-10-CM | POA: Diagnosis not present

## 2017-05-23 DIAGNOSIS — H5203 Hypermetropia, bilateral: Secondary | ICD-10-CM | POA: Diagnosis not present

## 2017-05-23 DIAGNOSIS — H52223 Regular astigmatism, bilateral: Secondary | ICD-10-CM | POA: Diagnosis not present

## 2017-05-23 DIAGNOSIS — Z961 Presence of intraocular lens: Secondary | ICD-10-CM | POA: Diagnosis not present

## 2017-05-23 DIAGNOSIS — H524 Presbyopia: Secondary | ICD-10-CM | POA: Diagnosis not present

## 2017-05-23 DIAGNOSIS — H2511 Age-related nuclear cataract, right eye: Secondary | ICD-10-CM | POA: Diagnosis not present

## 2017-05-23 DIAGNOSIS — Z7984 Long term (current) use of oral hypoglycemic drugs: Secondary | ICD-10-CM | POA: Diagnosis not present

## 2017-05-23 LAB — HM DIABETES EYE EXAM

## 2017-05-27 ENCOUNTER — Other Ambulatory Visit: Payer: Self-pay | Admitting: *Deleted

## 2017-05-27 MED ORDER — SIMVASTATIN 20 MG PO TABS: ORAL_TABLET | ORAL | 0 refills | Status: DC

## 2017-05-27 NOTE — Telephone Encounter (Signed)
[facility] 

## 2017-05-31 DIAGNOSIS — H6691 Otitis media, unspecified, right ear: Secondary | ICD-10-CM | POA: Diagnosis not present

## 2017-06-04 ENCOUNTER — Other Ambulatory Visit: Payer: Self-pay | Admitting: *Deleted

## 2017-06-04 MED ORDER — METFORMIN HCL 500 MG PO TABS: ORAL_TABLET | ORAL | 2 refills | Status: DC

## 2017-06-04 NOTE — Telephone Encounter (Signed)
[facility] 

## 2017-06-13 ENCOUNTER — Ambulatory Visit: Payer: PPO | Admitting: Nurse Practitioner

## 2017-06-20 ENCOUNTER — Encounter: Payer: Self-pay | Admitting: Nurse Practitioner

## 2017-06-20 ENCOUNTER — Ambulatory Visit (INDEPENDENT_AMBULATORY_CARE_PROVIDER_SITE_OTHER): Payer: PPO | Admitting: Nurse Practitioner

## 2017-06-20 VITALS — BP 154/82 | HR 92 | Temp 98.3°F | Ht 63.0 in | Wt 152.0 lb

## 2017-06-20 DIAGNOSIS — I10 Essential (primary) hypertension: Secondary | ICD-10-CM

## 2017-06-20 DIAGNOSIS — H6690 Otitis media, unspecified, unspecified ear: Secondary | ICD-10-CM

## 2017-06-20 MED ORDER — LEVOFLOXACIN 500 MG PO TABS: 500.0000 mg | ORAL_TABLET | Freq: Every day | ORAL | 0 refills | Status: DC

## 2017-06-20 NOTE — Progress Notes (Signed)
Careteam: Patient Care Team: [name], NP as PCP - General (Nurse Practitioner)  Advanced Directive information    Allergies  Allergen Reactions  . Codeine   . Sulfa Antibiotics     Chief Complaint  Patient presents with  . Acute Visit    Pt is being seen due to right ear pain for 2 days. Pt has been treated twice for right ear infection since January. Most recent ear infection was treated with antibiotics beginning 05/31/17.      HPI: Patient is a 72 y.o. female seen in the office today due to ongoing ear pain. Since January has been treated for 2 ear infections. Was placed on amoxicillin but only completed 5 days worth then ear infected recurred and she was placed on amoxicillin again but for 10 days when it resolved. Last was prescribed amoxicillin on 05/31/17 for 10 days, completed antibiotics on ~06/10/17 now with recurrent symptoms.  Reports right ear is throbbing.  No drainage.  No fever or chills.  No nasal congestion, runny nose, sore throat.    Review of Systems:  Review of Systems  Constitutional: Negative for chills and fever.  HENT: Positive for ear pain. Negative for congestion, ear discharge, hearing loss, nosebleeds, sinus pain, sore throat and tinnitus.   Respiratory: Negative for cough, shortness of breath, wheezing and stridor.   Cardiovascular: Negative for chest pain and leg swelling.  Neurological: Negative for dizziness, weakness and headaches.    Past Medical History:  Diagnosis Date  . Acute bronchitis   . Disorder of bone and cartilage, unspecified   . Elevated blood pressure reading without diagnosis of hypertension   . Myalgia and myositis, unspecified   . Osteoporosis, unspecified   . Other abnormal blood chemistry   . Other and unspecified hyperlipidemia   . Plantar fascial fibromatosis   . Type II or unspecified type diabetes mellitus without mention of complication, not stated as uncontrolled    Past Surgical History:    Procedure Laterality Date  . BREAST SURGERY  1997   reduction  . EYE SURGERY     Left eye contracts removed   Social History:   reports that  has never smoked. she has never used smokeless tobacco. She reports that she drinks alcohol. She reports that she does not use drugs.  History reviewed. No pertinent family history.  Medications: Patient's Medications  New Prescriptions   No medications on file  Previous Medications   ACETAMINOPHEN (TYLENOL 8 HOUR ARTHRITIS PAIN) 650 MG CR TABLET    Take 1,300 mg by mouth daily as needed for pain.   CHOLECALCIFEROL (VITAMIN D) 1000 UNITS TABLET    Take 2,000 Units by mouth daily.   METFORMIN (GLUCOPHAGE) 500 MG TABLET    take 1 tablet by mouth once daily WITH BREAKFAST TO CONTROL SUGAR   SIMVASTATIN (ZOCOR) 20 MG TABLET    take 1 tablet by mouth every evening TO LOWER CHOLESTEROL  Modified Medications   No medications on file  Discontinued Medications   No medications on file     Physical Exam:  Vitals:   06/20/17 1339  BP: (!) 154/82  Pulse: 92  Temp: 98.3 F (36.8 C)  TempSrc: Oral  SpO2: 99%  Weight: 152 lb (68.9 kg)  Height: 5\' 3"  (1.6 m)   Body mass index is 26.93 kg/m.  Physical Exam  Constitutional: She is oriented to person, place, and time. She appears well-developed and well-nourished.  HENT:  Head: Normocephalic and atraumatic.  Right Ear: External ear and ear canal normal. There is swelling. Tympanic membrane is injected.  Left Ear: Hearing, tympanic membrane, external ear and ear canal normal.  Nose: Nose normal.  Mouth/Throat: Oropharynx is clear and moist. No oropharyngeal exudate.  Cardiovascular: Normal rate, regular rhythm and normal heart sounds.  Pulmonary/Chest: Effort normal and breath sounds normal.  Neurological: She is alert and oriented to person, place, and time.  Skin: Skin is warm and dry.    Labs reviewed: Basic Metabolic Panel: Recent Labs    08/16/16 0854  NA 140  K 4.2  CL 106   CO2 24  GLUCOSE 100*  BUN 14  CREATININE 0.73  CALCIUM 9.4   Liver Function Tests: Recent Labs    08/16/16 0854  AST 25  ALT 21  ALKPHOS 71  BILITOT 0.4  PROT 7.1  ALBUMIN 4.4   No results for input(s): LIPASE, AMYLASE in the last 8760 hours. No results for input(s): AMMONIA in the last 8760 hours. CBC: Recent Labs    08/16/16 0854  WBC 4.9  NEUTROABS 2,156  HGB 12.2  HCT 37.1  MCV 88.1  PLT 321   Lipid Panel: Recent Labs    08/16/16 0854  CHOL 154  HDL 39*  LDLCALC 91  TRIG 120  CHOLHDL 3.9   TSH: No results for input(s): TSH in the last 8760 hours. A1C: Lab Results  Component Value Date   HGBA1C 5.5 08/16/2016     Assessment/Plan 1. Essential hypertension, benign -reports white coat syndrome but then states her blood pressure has been around 140/80s at home however reports she normally has been running around and will sit down really quick to check blood pressure. Dash diet given and encouraged to take blood pressure after she has been sitting for ~5 mins. To notify if staying over 140/80s.  2. Acute otitis media, unspecified otitis media type -recurrent. Stressed importance of taking all the antibiotics prescribed. May need ENT referral if recurs after Levaquin  - levofloxacin (LEVAQUIN) 500 MG tablet; Take 1 tablet (500 mg total) by mouth daily.  Dispense: 7 tablet; Refill: 0  Next appt: 09/05/2017 Carlos American. [place], [facility] [phone]

## 2017-06-20 NOTE — Patient Instructions (Addendum)
To start Levaquin 500 mg daily for 7 days for ear infection make sure you take entire course of antibiotic  To take probiotic twice daily to avoid GI symptoms   To notify if symptoms do not resolve or recur   DASH Eating Plan DASH stands for "Dietary Approaches to Stop Hypertension." The DASH eating plan is a healthy eating plan that has been shown to reduce high blood pressure (hypertension). It may also reduce your risk for type 2 diabetes, heart disease, and stroke. The DASH eating plan may also help with weight loss. What are tips for following this plan? General guidelines  Avoid eating more than 2,300 mg (milligrams) of salt (sodium) a day. If you have hypertension, you may need to reduce your sodium intake to 1,500 mg a day.  Limit alcohol intake to no more than 1 drink a day for nonpregnant women and 2 drinks a day for men. One drink equals 12 oz of beer, 5 oz of wine, or 1 oz of hard liquor.  Work with your health care provider to maintain a healthy body weight or to lose weight. Ask what an ideal weight is for you.  Get at least 30 minutes of exercise that causes your heart to beat faster (aerobic exercise) most days of the week. Activities may include walking, swimming, or biking.  Work with your health care provider or diet and nutrition specialist (dietitian) to adjust your eating plan to your individual calorie needs. Reading food labels  Check food labels for the amount of sodium per serving. Choose foods with less than 5 percent of the Daily Value of sodium. Generally, foods with less than 300 mg of sodium per serving fit into this eating plan.  To find whole grains, look for the word "whole" as the first word in the ingredient list. Shopping  Buy products labeled as "low-sodium" or "no salt added."  Buy fresh foods. Avoid canned foods and premade or frozen meals. Cooking  Avoid adding salt when cooking. Use salt-free seasonings or herbs instead of table salt or sea  salt. Check with your health care provider or pharmacist before using salt substitutes.  Do not fry foods. Cook foods using healthy methods such as baking, boiling, grilling, and broiling instead.  Cook with heart-healthy oils, such as olive, canola, soybean, or sunflower oil. Meal planning   Eat a balanced diet that includes: ? 5 or more servings of fruits and vegetables each day. At each meal, try to fill half of your plate with fruits and vegetables. ? Up to 6-8 servings of whole grains each day. ? Less than 6 oz of lean meat, poultry, or fish each day. A 3-oz serving of meat is about the same size as a deck of cards. One egg equals 1 oz. ? 2 servings of low-fat dairy each day. ? A serving of nuts, seeds, or beans 5 times each week. ? Heart-healthy fats. Healthy fats called Omega-3 fatty acids are found in foods such as flaxseeds and coldwater fish, like sardines, salmon, and mackerel.  Limit how much you eat of the following: ? Canned or prepackaged foods. ? Food that is high in trans fat, such as fried foods. ? Food that is high in saturated fat, such as fatty meat. ? Sweets, desserts, sugary drinks, and other foods with added sugar. ? Full-fat dairy products.  Do not salt foods before eating.  Try to eat at least 2 vegetarian meals each week.  Eat more home-cooked food and less restaurant,  buffet, and fast food.  When eating at a restaurant, ask that your food be prepared with less salt or no salt, if possible. What foods are recommended? The items listed may not be a complete list. Talk with your dietitian about what dietary choices are best for you. Grains Whole-grain or whole-wheat bread. Whole-grain or whole-wheat pasta. Brown rice. [place]ena [place]row. Bulgur. Whole-grain and low-sodium cereals. Pita bread. Low-fat, low-sodium crackers. Whole-wheat flour tortillas. Vegetables Fresh or frozen vegetables (raw, steamed, roasted, or grilled). Low-sodium or reduced-sodium tomato  and vegetable juice. Low-sodium or reduced-sodium tomato sauce and tomato paste. Low-sodium or reduced-sodium canned vegetables. Fruits All fresh, dried, or frozen fruit. Canned fruit in natural juice (without added sugar). Meat and other protein foods Skinless chicken or [place]. Ground chicken or [place]. Pork with fat trimmed off. Fish and seafood. Egg whites. Dried beans, peas, or lentils. Unsalted nuts, nut butters, and seeds. Unsalted canned beans. Lean cuts of beef with fat trimmed off. Low-sodium, lean deli meat. Dairy Low-fat (1%) or fat-free (skim) milk. Fat-free, low-fat, or reduced-fat cheeses. Nonfat, low-sodium ricotta or cottage cheese. Low-fat or nonfat yogurt. Low-fat, low-sodium cheese. Fats and oils Soft margarine without trans fats. Vegetable oil. Low-fat, reduced-fat, or light mayonnaise and salad dressings (reduced-sodium). Canola, safflower, olive, soybean, and sunflower oils. Avocado. Seasoning and other foods Herbs. Spices. Seasoning mixes without salt. Unsalted popcorn and pretzels. Fat-free sweets. What foods are not recommended? The items listed may not be a complete list. Talk with your dietitian about what dietary choices are best for you. Grains Baked goods made with fat, such as croissants, muffins, or some breads. Dry pasta or rice meal packs. Vegetables Creamed or fried vegetables. Vegetables in a cheese sauce. Regular canned vegetables (not low-sodium or reduced-sodium). Regular canned tomato sauce and paste (not low-sodium or reduced-sodium). Regular tomato and vegetable juice (not low-sodium or reduced-sodium). Angie Fava. Olives. Fruits Canned fruit in a light or heavy syrup. Fried fruit. Fruit in cream or butter sauce. Meat and other protein foods Fatty cuts of meat. Ribs. Fried meat. Berniece Salines. Sausage. Bologna and other processed lunch meats. Salami. Fatback. Hotdogs. Bratwurst. Salted nuts and seeds. Canned beans with added salt. Canned or smoked fish. Whole eggs  or egg yolks. Chicken or [place] with skin. Dairy Whole or 2% milk, cream, and half-and-half. Whole or full-fat cream cheese. Whole-fat or sweetened yogurt. Full-fat cheese. Nondairy creamers. Whipped toppings. Processed cheese and cheese spreads. Fats and oils Butter. Stick margarine. Lard. Shortening. Ghee. Bacon fat. Tropical oils, such as coconut, palm kernel, or palm oil. Seasoning and other foods Salted popcorn and pretzels. Onion salt, garlic salt, seasoned salt, table salt, and sea salt. Worcestershire sauce. Tartar sauce. Barbecue sauce. Teriyaki sauce. Soy sauce, including reduced-sodium. Steak sauce. Canned and packaged gravies. Fish sauce. Oyster sauce. Cocktail sauce. Horseradish that you find on the shelf. Ketchup. Mustard. Meat flavorings and tenderizers. Bouillon cubes. Hot sauce and Tabasco sauce. Premade or packaged marinades. Premade or packaged taco seasonings. Relishes. Regular salad dressings. Where to find more information:  National Heart, Lung, and [place]: https://wilson-eaton.com/  American Heart Association: www.heart.org Summary  The DASH eating plan is a healthy eating plan that has been shown to reduce high blood pressure (hypertension). It may also reduce your risk for type 2 diabetes, heart disease, and stroke.  With the DASH eating plan, you should limit salt (sodium) intake to 2,300 mg a day. If you have hypertension, you may need to reduce your sodium intake to 1,500 mg a day.  When  on the DASH eating plan, aim to eat more fresh fruits and vegetables, whole grains, lean proteins, low-fat dairy, and heart-healthy fats.  Work with your health care provider or diet and nutrition specialist (dietitian) to adjust your eating plan to your individual calorie needs. This information is not intended to replace advice given to you by your health care provider. Make sure you discuss any questions you have with your health care provider. Document Released: 03/22/2011  Document Revised: 03/26/2016 Document Reviewed: 03/26/2016 Elsevier Interactive Patient Education  [employer].

## 2017-07-04 ENCOUNTER — Ambulatory Visit (INDEPENDENT_AMBULATORY_CARE_PROVIDER_SITE_OTHER): Payer: PPO | Admitting: Nurse Practitioner

## 2017-07-04 ENCOUNTER — Encounter: Payer: Self-pay | Admitting: Nurse Practitioner

## 2017-07-04 VITALS — BP 148/76 | HR 85 | Temp 98.4°F | Ht 63.0 in | Wt 148.0 lb

## 2017-07-04 DIAGNOSIS — H6691 Otitis media, unspecified, right ear: Secondary | ICD-10-CM | POA: Diagnosis not present

## 2017-07-04 DIAGNOSIS — H6690 Otitis media, unspecified, unspecified ear: Secondary | ICD-10-CM

## 2017-07-04 DIAGNOSIS — H6061 Unspecified chronic otitis externa, right ear: Secondary | ICD-10-CM | POA: Diagnosis not present

## 2017-07-04 DIAGNOSIS — H6121 Impacted cerumen, right ear: Secondary | ICD-10-CM | POA: Diagnosis not present

## 2017-07-04 DIAGNOSIS — H6521 Chronic serous otitis media, right ear: Secondary | ICD-10-CM | POA: Diagnosis not present

## 2017-07-04 NOTE — Progress Notes (Signed)
Careteam: Patient Care Team: [name], NP as PCP - General (Nurse Practitioner)  Advanced Directive information    Allergies  Allergen Reactions  . Codeine   . Sulfa Antibiotics     Chief Complaint  Patient presents with  . Acute Visit    Pt is being seen due to a thumping sensation in right ear that can be felt in throat.      HPI: Patient is a 72 y.o. female seen in the office today due to ongoing ear symptoms. Pt has been been treated with 2 courses of amoxicillin and then was seen on 06/20/17 for recurrence and prescribed Levaquin 500 mg daily for 7 days.  Reports 2 days ago woke up with thumping in her ear- not necessarily a pain but a sensation.  Did not feel it yesterday but noticed again this morning.  Not painful but feels it also feeling that goes down throat/side of neck  Review of Systems:  Review of Systems  Constitutional: Negative for chills, fever and malaise/fatigue.  HENT: Negative for congestion, ear discharge, ear pain, hearing loss, nosebleeds, sinus pain, sore throat and tinnitus.   Respiratory: Negative for sputum production and stridor.   Cardiovascular: Negative for chest pain.    Past Medical History:  Diagnosis Date  . Acute bronchitis   . Disorder of bone and cartilage, unspecified   . Elevated blood pressure reading without diagnosis of hypertension   . Myalgia and myositis, unspecified   . Osteoporosis, unspecified   . Other abnormal blood chemistry   . Other and unspecified hyperlipidemia   . Plantar fascial fibromatosis   . Type II or unspecified type diabetes mellitus without mention of complication, not stated as uncontrolled    Past Surgical History:  Procedure Laterality Date  . BREAST SURGERY  1997   reduction  . EYE SURGERY     Left eye contracts removed   Social History:   reports that she has never smoked. She has never used smokeless tobacco. She reports that she drinks alcohol. She reports that she does not  use drugs.  History reviewed. No pertinent family history.  Medications: Patient's Medications  New Prescriptions   No medications on file  Previous Medications   ACETAMINOPHEN (TYLENOL 8 HOUR ARTHRITIS PAIN) 650 MG CR TABLET    Take 1,300 mg by mouth daily as needed for pain.   CHOLECALCIFEROL (VITAMIN D) 1000 UNITS TABLET    Take 2,000 Units by mouth daily.   METFORMIN (GLUCOPHAGE) 500 MG TABLET    take 1 tablet by mouth once daily WITH BREAKFAST TO CONTROL SUGAR   SIMVASTATIN (ZOCOR) 20 MG TABLET    take 1 tablet by mouth every evening TO LOWER CHOLESTEROL  Modified Medications   No medications on file  Discontinued Medications   LEVOFLOXACIN (LEVAQUIN) 500 MG TABLET    Take 1 tablet (500 mg total) by mouth daily.     Physical Exam:  Vitals:   07/04/17 1041  BP: (!) 148/76  Pulse: 85  Temp: 98.4 F (36.9 C)  TempSrc: Oral  SpO2: 99%  Weight: 148 lb (67.1 kg)  Height: 5\' 3"  (1.6 m)   Body mass index is 26.22 kg/m.  Physical Exam  Constitutional: She appears well-developed and well-nourished.  HENT:  Head: Normocephalic and atraumatic.  Right Ear: Hearing, tympanic membrane, external ear and ear canal normal. No tenderness.  Left Ear: Hearing, tympanic membrane, external ear and ear canal normal. No tenderness.  Nose: Nose normal.  Mouth/Throat:  Oropharynx is clear and moist. No oropharyngeal exudate.  Cardiovascular: Normal rate, regular rhythm and normal heart sounds.  Pulmonary/Chest: Effort normal and breath sounds normal.    Labs reviewed: Basic Metabolic Panel: Recent Labs    08/16/16 0854  NA 140  K 4.2  CL 106  CO2 24  GLUCOSE 100*  BUN 14  CREATININE 0.73  CALCIUM 9.4   Liver Function Tests: Recent Labs    08/16/16 0854  AST 25  ALT 21  ALKPHOS 71  BILITOT 0.4  PROT 7.1  ALBUMIN 4.4   No results for input(s): LIPASE, AMYLASE in the last 8760 hours. No results for input(s): AMMONIA in the last 8760 hours. CBC: Recent Labs     08/16/16 0854  WBC 4.9  NEUTROABS 2,156  HGB 12.2  HCT 37.1  MCV 88.1  PLT 321   Lipid Panel: Recent Labs    08/16/16 0854  CHOL 154  HDL 39*  LDLCALC 91  TRIG 120  CHOLHDL 3.9   TSH: No results for input(s): TSH in the last 8760 hours. A1C: Lab Results  Component Value Date   HGBA1C 5.5 08/16/2016     Assessment/Plan 1. Acute otitis media, unspecified otitis media type -acute pain has improved however now with a thumping senstation that has woken her up.  -has completed 2 rounds of Amoxicillin and Levaquin at this time will place referral to ENT for further evaluation   [place]ica K. [place], [facility] [phone]

## 2017-07-25 DIAGNOSIS — H6061 Unspecified chronic otitis externa, right ear: Secondary | ICD-10-CM | POA: Diagnosis not present

## 2017-08-20 DIAGNOSIS — H902 Conductive hearing loss, unspecified: Secondary | ICD-10-CM | POA: Diagnosis not present

## 2017-08-20 DIAGNOSIS — H6521 Chronic serous otitis media, right ear: Secondary | ICD-10-CM | POA: Diagnosis not present

## 2017-08-21 ENCOUNTER — Other Ambulatory Visit: Payer: Self-pay | Admitting: Nurse Practitioner

## 2017-09-05 ENCOUNTER — Other Ambulatory Visit: Payer: PPO

## 2017-09-05 DIAGNOSIS — E119 Type 2 diabetes mellitus without complications: Secondary | ICD-10-CM | POA: Diagnosis not present

## 2017-09-05 DIAGNOSIS — I10 Essential (primary) hypertension: Secondary | ICD-10-CM | POA: Diagnosis not present

## 2017-09-05 DIAGNOSIS — E785 Hyperlipidemia, unspecified: Secondary | ICD-10-CM

## 2017-09-06 ENCOUNTER — Other Ambulatory Visit: Payer: Self-pay

## 2017-09-06 DIAGNOSIS — R7989 Other specified abnormal findings of blood chemistry: Secondary | ICD-10-CM

## 2017-09-06 DIAGNOSIS — R945 Abnormal results of liver function studies: Principal | ICD-10-CM

## 2017-09-06 LAB — COMPLETE METABOLIC PANEL WITH GFR
AG RATIO: 1.7 (calc) (ref 1.0–2.5)
ALT: 37 U/L — ABNORMAL HIGH (ref 6–29)
AST: 37 U/L — ABNORMAL HIGH (ref 10–35)
Albumin: 4.5 g/dL (ref 3.6–5.1)
Alkaline phosphatase (APISO): 80 U/L (ref 33–130)
BUN: 15 mg/dL (ref 7–25)
CALCIUM: 9.6 mg/dL (ref 8.6–10.4)
CHLORIDE: 106 mmol/L (ref 98–110)
CO2: 24 mmol/L (ref 20–32)
Creat: 0.65 mg/dL (ref 0.60–0.93)
GFR, EST NON AFRICAN AMERICAN: 89 mL/min/{1.73_m2} (ref >=60)
GFR, Est African American: 104 mL/min/{1.73_m2} (ref >=60)
Globulin: 2.6 g/dL (calc) (ref 1.9–3.7)
Glucose, Bld: 105 mg/dL — ABNORMAL HIGH (ref 65–99)
POTASSIUM: 4.1 mmol/L (ref 3.5–5.3)
Sodium: 140 mmol/L (ref 135–146)
Total Bilirubin: 0.5 mg/dL (ref 0.2–1.2)
Total Protein: 7.1 g/dL (ref 6.1–8.1)

## 2017-09-06 LAB — LIPID PANEL
CHOLESTEROL: 137 mg/dL (ref <200)
HDL: 41 mg/dL — ABNORMAL LOW (ref >50)
LDL Cholesterol (Calc): 80 mg/dL (calc)
Non-HDL Cholesterol (Calc): 96 mg/dL (calc) (ref <130)
Total CHOL/HDL Ratio: 3.3 (calc) (ref <5.0)
Triglycerides: 77 mg/dL (ref <150)

## 2017-09-06 LAB — CBC WITH DIFFERENTIAL/PLATELET
Basophils Absolute: 51 cells/uL (ref 0–200)
Basophils Relative: 1.1 %
EOS ABS: 129 {cells}/uL (ref 15–500)
EOS PCT: 2.8 %
HCT: 37.8 % (ref 35.0–45.0)
Hemoglobin: 12.9 g/dL (ref 11.7–15.5)
LYMPHS ABS: 1900 {cells}/uL (ref 850–3900)
MCH: 28.9 pg (ref 27.0–33.0)
MCHC: 34.1 g/dL (ref 32.0–36.0)
MCV: 84.8 fL (ref 80.0–100.0)
MPV: 9.8 fL (ref 7.5–12.5)
Monocytes Relative: 11.4 %
NEUTROS ABS: 1996 {cells}/uL (ref 1500–7800)
NEUTROS PCT: 43.4 %
PLATELETS: 272 10*3/uL (ref 140–400)
RBC: 4.46 10*6/uL (ref 3.80–5.10)
RDW: 13.2 % (ref 11.0–15.0)
TOTAL LYMPHOCYTE: 41.3 %
WBC: 4.6 10*3/uL (ref 3.8–10.8)
WBCMIX: 524 {cells}/uL (ref 200–950)

## 2017-09-06 LAB — HEMOGLOBIN A1C
EAG (MMOL/L): 6.3 (calc)
Hgb A1c MFr Bld: 5.6 % of total Hgb (ref <5.7)
MEAN PLASMA GLUCOSE: 114 (calc)

## 2017-09-12 ENCOUNTER — Encounter: Payer: PPO | Admitting: Nurse Practitioner

## 2017-09-13 ENCOUNTER — Ambulatory Visit (INDEPENDENT_AMBULATORY_CARE_PROVIDER_SITE_OTHER): Payer: PPO

## 2017-09-13 ENCOUNTER — Ambulatory Visit (INDEPENDENT_AMBULATORY_CARE_PROVIDER_SITE_OTHER): Payer: PPO | Admitting: Nurse Practitioner

## 2017-09-13 ENCOUNTER — Encounter: Payer: Self-pay | Admitting: Nurse Practitioner

## 2017-09-13 VITALS — BP 138/84 | HR 92 | Temp 98.7°F | Ht 63.0 in | Wt 151.0 lb

## 2017-09-13 DIAGNOSIS — R945 Abnormal results of liver function studies: Secondary | ICD-10-CM

## 2017-09-13 DIAGNOSIS — Z0000 Encounter for general adult medical examination without abnormal findings: Secondary | ICD-10-CM

## 2017-09-13 DIAGNOSIS — R7989 Other specified abnormal findings of blood chemistry: Secondary | ICD-10-CM

## 2017-09-13 DIAGNOSIS — I10 Essential (primary) hypertension: Secondary | ICD-10-CM

## 2017-09-13 DIAGNOSIS — E785 Hyperlipidemia, unspecified: Secondary | ICD-10-CM | POA: Diagnosis not present

## 2017-09-13 DIAGNOSIS — E119 Type 2 diabetes mellitus without complications: Secondary | ICD-10-CM | POA: Diagnosis not present

## 2017-09-13 MED ORDER — ZOSTER VAC RECOMB ADJUVANTED 50 MCG/0.5ML IM SUSR: 0.5000 mL | Freq: Once | INTRAMUSCULAR | 1 refills | Status: AC

## 2017-09-13 NOTE — Progress Notes (Signed)
Careteam: Patient Care Team: [name], NP as PCP - General (Nurse Practitioner)  Advanced Directive information    Allergies  Allergen Reactions  . Codeine   . Sulfa Antibiotics     Chief Complaint  Patient presents with  . Annual Exam    AWV completed today. MMSE 27/30. Passed clock test.      HPI: Patient is a 72 y.o. female seen in the office today for annual exam/physical.  [facility] and gets bone density. Last OV was 12/06/16    Blood pressure better outside of office setting 124/70.  Unable to tolerate ACE Mildly elevated liver enzymes noted on last labs. Takes ~2 tylenol daily, drinks a cocktail every other day and on simvastatin. Follow up labs scheduled.   Review of Systems:  Review of Systems  Constitutional: Negative for chills, fever and malaise/fatigue.  HENT: Negative for congestion and hearing loss.   Eyes: Negative for blurred vision.  Respiratory: Negative for cough and shortness of breath.   Cardiovascular: Negative for chest pain and leg swelling.  Gastrointestinal: Negative for blood in stool, constipation, heartburn and melena.  Genitourinary: Negative for dysuria, frequency and urgency.  Musculoskeletal: Positive for joint pain. Negative for falls and myalgias.       Occasional knee pain  Skin: Negative for rash.  Neurological: Negative for dizziness, loss of consciousness and weakness.  Psychiatric/Behavioral: Negative for depression and memory loss.    Past Medical History:  Diagnosis Date  . Acute bronchitis   . Disorder of bone and cartilage, unspecified   . Elevated blood pressure reading without diagnosis of hypertension   . Myalgia and myositis, unspecified   . Osteoporosis, unspecified   . Other abnormal blood chemistry   . Other and unspecified hyperlipidemia   . Plantar fascial fibromatosis   . Type II or unspecified type diabetes mellitus without mention of complication, not  stated as uncontrolled    Past Surgical History:  Procedure Laterality Date  . BREAST SURGERY  1997   reduction  . EYE SURGERY     Left eye contracts removed   Social History:   reports that she has never smoked. She has never used smokeless tobacco. She reports that she drinks alcohol. She reports that she does not use drugs.  History reviewed. No pertinent family history.  Medications: Patient's Medications  New Prescriptions   No medications on file  Previous Medications   ACETAMINOPHEN (TYLENOL 8 HOUR ARTHRITIS PAIN) 650 MG CR TABLET    Take 1,300 mg by mouth daily as needed for pain.   CHOLECALCIFEROL (VITAMIN D) 1000 UNITS TABLET    Take 2,000 Units by mouth daily.   METFORMIN (GLUCOPHAGE) 500 MG TABLET    take 1 tablet by mouth once daily WITH BREAKFAST TO CONTROL SUGAR   SIMVASTATIN (ZOCOR) 20 MG TABLET    TAKE 1 TABLET BY MOUTH EVERY EVENING TO LOWER CHOLESTROL   ZOSTER VACCINE ADJUVANTED (SHINGRIX) INJECTION    Inject 0.5 mLs into the muscle once for 1 dose.  Modified Medications   No medications on file  Discontinued Medications   No medications on file     Physical Exam:  Vitals:   09/13/17 0844  BP: 138/84  Pulse: 92  Temp: 98.7 F (37.1 C)  SpO2: 97%  Weight: 151 lb (68.5 kg)  Height: 5\' 3"  (1.6 m)   Body mass index is 26.75 kg/m.  Physical Exam  Constitutional: She is oriented to person,  place, and time. She appears well-developed and well-nourished. No distress.  HENT:  Head: Normocephalic and atraumatic.  Right Ear: External ear normal.  Left Ear: External ear normal.  Nose: Nose normal.  Mouth/Throat: Oropharynx is clear and moist. No oropharyngeal exudate.  Eyes: Pupils are equal, round, and reactive to light. Conjunctivae and EOM are normal.  Neck: Normal range of motion. Neck supple. No JVD present. No thyromegaly present.  Cardiovascular: Normal rate, regular rhythm, normal heart sounds and intact distal pulses.  Pulmonary/Chest: Effort  normal and breath sounds normal. No respiratory distress.  Breast exam by GYN  Abdominal: Soft. Bowel sounds are normal. She exhibits no distension. There is no tenderness.  Genitourinary:  Genitourinary Comments: By GYN  Musculoskeletal: Normal range of motion. She exhibits no edema or tenderness.  Neurological: She is alert and oriented to person, place, and time. She has normal reflexes. No cranial nerve deficit.  Skin: Skin is warm and dry. She is not diaphoretic. No erythema. No pallor.  Psychiatric: She has a normal mood and affect.    Labs reviewed: Basic Metabolic Panel: Recent Labs    09/05/17 0817  NA 140  K 4.1  CL 106  CO2 24  GLUCOSE 105*  BUN 15  CREATININE 0.65  CALCIUM 9.6   Liver Function Tests: Recent Labs    09/05/17 0817  AST 37*  ALT 37*  BILITOT 0.5  PROT 7.1   No results for input(s): LIPASE, AMYLASE in the last 8760 hours. No results for input(s): AMMONIA in the last 8760 hours. CBC: Recent Labs    09/05/17 0817  WBC 4.6  NEUTROABS 1,996  HGB 12.9  HCT 37.8  MCV 84.8  PLT 272   Lipid Panel: Recent Labs    09/05/17 0817  CHOL 137  HDL 41*  LDLCALC 80  TRIG 77  CHOLHDL 3.3   TSH: No results for input(s): TSH in the last 8760 hours. A1C: Lab Results  Component Value Date   HGBA1C 5.6 09/05/2017     Assessment/Plan 1. Wellness examination -doing well, following with GYN for pelvic, breast exam and bone density -The patient was counseled regarding the appropriate use of alcohol, regular self-examination of the breasts on a monthly basis, prevention of dental and periodontal disease, diet, regular sustained exercise for at least 30 minutes 5 times per week, routine screening interval for mammogram as recommended by the [place] and ACOG, and recommended schedule for GI hemoccult testing, colonoscopy, cholesterol, thyroid and diabetes screening. exercise and healthy eating habits reviewed   2. Essential  hypertension, benign -white coat syndrome, generally controlled on diet.  - EKG 12-Lead- SR, consistent with previous   3. Elevated liver function tests -follow up scheduled, discussed reducing tylenol and ETOH  4. Controlled type 2 diabetes mellitus without complication, without long-term current use of insulin ([place]) -continue on metformin, A1c at goal  - Microalbumin, urine  5. Hyperlipidemia LDL goal <70 -LDL 80, will continue current regimen at this time.   Next appt: follow up with Lab as scheduled and 6 months for routine follow up  [place]. [place], [facility] [phone]

## 2017-09-13 NOTE — Progress Notes (Signed)
Subjective:   Jasmine James is a 72 y.o. female who presents for Medicare Annual (Subsequent) preventive examination.  Last AWV-08/16/2016    Objective:     Vitals: BP 138/84 (BP Location: Left Arm, Patient Position: Sitting)   Pulse 92   Temp 98.7 F (37.1 C) (Oral)   Ht 5\' 3"  (1.6 m)   Wt 151 lb (68.5 kg)   SpO2 97%   BMI 26.75 kg/m   Body mass index is 26.75 kg/m.  Advanced Directives 09/13/2017 10/07/2016 08/30/2016 08/16/2016 07/21/2015  Does Patient Have a Medical Advance Directive? Yes No Yes Yes No  Type of Paramedic of [place];Living will - [place];Living will [place];Living will -  Does patient want to make changes to medical advance directive? Yes (MAU/Ambulatory/Procedural Areas - Information given) - - No - Patient declined -  Copy of [place] in Chart? No - copy requested - No - copy requested No - copy requested -  Would patient like information on creating a medical advance directive? - - - - Yes - Scientist, clinical (histocompatibility and immunogenetics) given    Tobacco Social History   Tobacco Use  Smoking Status Never Smoker  Smokeless Tobacco Never Used     Counseling given: Not Answered   Clinical Intake:  Pre-visit preparation completed: No  Pain : No/denies pain     Nutritional Risks: None Diabetes: Yes CBG done?: No Did pt. bring in CBG monitor from home?: No  How often do you need to have someone help you when you read instructions, pamphlets, or other written materials from your doctor or pharmacy?: 1 - Never What is the last grade level you completed in school?: 12th grade  Interpreter Needed?: No  Information entered by :: Tyson Dense, RN  Past Medical History:  Diagnosis Date  . Acute bronchitis   . Disorder of bone and cartilage, unspecified   . Elevated blood pressure reading without diagnosis of hypertension   . Myalgia and myositis, unspecified   . Osteoporosis,  unspecified   . Other abnormal blood chemistry   . Other and unspecified hyperlipidemia   . Plantar fascial fibromatosis   . Type II or unspecified type diabetes mellitus without mention of complication, not stated as uncontrolled    Past Surgical History:  Procedure Laterality Date  . BREAST SURGERY  1997   reduction  . EYE SURGERY     Left eye contracts removed   No family history on file. Social History   Socioeconomic History  . Marital status: Married    Spouse name: Not on file  . Number of children: Not on file  . Years of education: Not on file  . Highest education level: Not on file  Occupational History  . Not on file  Social Needs  . Financial resource strain: Not hard at all  . Food insecurity:    Worry: Never true    Inability: Never true  . Transportation needs:    Medical: No    Non-medical: No  Tobacco Use  . Smoking status: Never Smoker  . Smokeless tobacco: Never Used  Substance and Sexual Activity  . Alcohol use: Yes    Comment: glass wine every third day  . Drug use: No  . Sexual activity: Not Currently  Lifestyle  . Physical activity:    Days per week: 7 days    Minutes per session: 60 min  . Stress: Not at all  Relationships  . Social  connections:    Talks on phone: More than three times a week    Gets together: More than three times a week    Attends religious service: More than 4 times per year    Active member of club or organization: Yes    Attends meetings of clubs or organizations: More than 4 times per year    Relationship status: Married  Other Topics Concern  . Not on file  Social History Narrative  . Not on file    Outpatient Encounter Medications as of 09/13/2017  Medication Sig  . acetaminophen (TYLENOL 8 HOUR ARTHRITIS PAIN) 650 MG CR tablet Take 1,300 mg by mouth daily as needed for pain.  . cholecalciferol (VITAMIN D) 1000 units tablet Take 2,000 Units by mouth daily.  . metFORMIN (GLUCOPHAGE) 500 MG tablet take 1  tablet by mouth once daily WITH BREAKFAST TO CONTROL SUGAR  . simvastatin (ZOCOR) 20 MG tablet TAKE 1 TABLET BY MOUTH EVERY EVENING TO LOWER CHOLESTROL   No facility-administered encounter medications on file as of 09/13/2017.     Activities of Daily Living In your present state of health, do you have any difficulty performing the following activities: 09/13/2017  Hearing? Y  Vision? N  Difficulty concentrating or making decisions? N  Walking or climbing stairs? N  Dressing or bathing? N  Doing errands, shopping? N  Preparing Food and eating ? N  Using the Toilet? N  In the past six months, have you accidently leaked urine? N  Do you have problems with loss of bowel control? N  Managing your Medications? N  Managing your Finances? N  Housekeeping or managing your Housekeeping? N  Some recent data might be hidden    Patient Care Team: [name], NP as PCP - General (Nurse Practitioner)    Assessment:   This is a routine wellness examination for [place].  Exercise Activities and Dietary recommendations Current Exercise Habits: Home exercise routine, Type of exercise: walking, Time (Minutes): 60, Frequency (Times/Week): 7, Weekly Exercise (Minutes/Week): 420, Intensity: Mild, Exercise limited by: None identified  Goals    None      Fall Risk Fall Risk  09/13/2017 09/13/2017 09/13/2017 07/04/2017 06/20/2017  Falls in the past year? Yes No No No No  Number falls in past yr: - - - - -  Injury with Fall? No - - - -   Is the patient's home free of loose throw rugs in walkways, pet beds, electrical cords, etc?   yes      Grab bars in the bathroom? no      Handrails on the stairs?   yes      Adequate lighting?   yes   Depression Screen PHQ 2/9 Scores 09/13/2017 08/16/2016 07/21/2015 03/04/2014  PHQ - 2 Score 0 0 0 0     Cognitive Function MMSE - Mini Mental State Exam 09/13/2017 08/16/2016  Orientation to time 4 5  Orientation to Place 4 5  Registration 3 3  Attention/  Calculation 5 5  Recall 2 2  Language- name 2 objects 2 2  Language- repeat 1 1  Language- follow 3 step command 3 3  Language- read & follow direction 1 1  Write a sentence 1 1  Copy design 1 1  Total score 27 29        Immunization History  Administered Date(s) Administered  . Influenza-Unspecified 02/14/2015  . Pneumococcal Conjugate-13 10/22/2013  . Pneumococcal Polysaccharide-23 08/16/2016  . Td 04/24/2009  . Tdap  10/07/2016    Qualifies for Shingles Vaccine? Yes, educated and ordered to pharmacy  Screening Tests Health Maintenance  Topic Date Due  . Hepatitis C Screening  03-05-1946  . OPHTHALMOLOGY EXAM  01/14/2017  . URINE MICROALBUMIN  08/30/2017  . INFLUENZA VACCINE  11/14/2017  . HEMOGLOBIN A1C  03/08/2018  . FOOT EXAM  03/14/2018  . MAMMOGRAM  12/07/2018  . COLONOSCOPY  06/29/2026  . TETANUS/TDAP  10/08/2026  . DEXA SCAN  Completed  . PNA vac Low Risk Adult  Completed    Cancer Screenings: Lung: Low Dose CT Chest recommended if Age 54-80 years, 30 pack-year currently smoking OR have quit w/in 15years. Patient does not qualify. Breast:  Up to date on Mammogram? Yes   Up to date of Bone Density/Dexa? Yes Colorectal: up to date  Additional Screenings:  Hepatitis C Screening: declined Patient stated she had eye exam done last week. Record release form signed    Plan:  I have personally reviewed and addressed the Medicare Annual Wellness questionnaire and have noted the following in the patient's chart:  A. Medical and social history B. Use of alcohol, tobacco or illicit drugs  C. Current medications and supplements D. Functional ability and status E.  Nutritional status F.  Physical activity G. Advance directives H. List of other physicians I.  Hospitalizations, surgeries, and ER visits in previous 12 months J.  [place] to include hearing, vision, cognitive, depression L. Referrals and appointments - none  In addition, I have  reviewed and discussed with patient certain preventive protocols, quality metrics, and best practice recommendations. A written personalized care plan for preventive services as well as general preventive health recommendations were provided to patient.  See attached scanned questionnaire for additional information.   Signed,   Tyson Dense, RN Nurse Health Advisor  Patient Concerns: None

## 2017-09-13 NOTE — Patient Instructions (Signed)
To keep follow up lab appt  Health Maintenance, Female Adopting a healthy lifestyle and getting preventive care can go a long way to promote health and wellness. Talk with your health care provider about what schedule of regular examinations is right for you. This is a good chance for you to check in with your provider about disease prevention and staying healthy. In between checkups, there are plenty of things you can do on your own. Experts have done a lot of research about which lifestyle changes and preventive measures are most likely to keep you healthy. Ask your health care provider for more information. Weight and diet Eat a healthy diet  Be sure to include plenty of vegetables, fruits, low-fat dairy products, and lean protein.  Do not eat a lot of foods high in solid fats, added sugars, or salt.  Get regular exercise. This is one of the most important things you can do for your health. ? Most adults should exercise for at least 150 minutes each week. The exercise should increase your heart rate and make you sweat (moderate-intensity exercise). ? Most adults should also do strengthening exercises at least twice a week. This is in addition to the moderate-intensity exercise.  Maintain a healthy weight  Body mass index (BMI) is a measurement that can be used to identify possible weight problems. It estimates body fat based on height and weight. Your health care provider can help determine your BMI and help you achieve or maintain a healthy weight.  For females 48 years of age and older: ? A BMI below 18.5 is considered underweight. ? A BMI of 18.5 to 24.9 is normal. ? A BMI of 25 to 29.9 is considered overweight. ? A BMI of 30 and above is considered obese.  Watch levels of cholesterol and blood lipids  You should start having your blood tested for lipids and cholesterol at 72 years of age, then have this test every 5 years.  You may need to have your cholesterol levels checked  more often if: ? Your lipid or cholesterol levels are high. ? You are older than 72 years of age. ? You are at high risk for heart disease.  Cancer screening Lung Cancer  Lung cancer screening is recommended for adults 67-23 years old who are at high risk for lung cancer because of a history of smoking.  A yearly low-dose CT scan of the lungs is recommended for people who: ? Currently smoke. ? Have quit within the past 15 years. ? Have at least a 30-pack-year history of smoking. A pack year is smoking an average of one pack of cigarettes a day for 1 year.  Yearly screening should continue until it has been 15 years since you quit.  Yearly screening should stop if you develop a health problem that would prevent you from having lung cancer treatment.  Breast Cancer  Practice breast self-awareness. This means understanding how your breasts normally appear and feel.  It also means doing regular breast self-exams. Let your health care provider know about any changes, no matter how small.  If you are in your 20s or 30s, you should have a clinical breast exam (CBE) by a health care provider every 1-3 years as part of a regular health exam.  If you are 41 or older, have a CBE every year. Also consider having a breast X-ray (mammogram) every year.  If you have a family history of breast cancer, talk to your health care provider about genetic screening.  If you are at high risk for breast cancer, talk to your health care provider about having an MRI and a mammogram every year.  Breast cancer gene (BRCA) assessment is recommended for women who have family members with BRCA-related cancers. BRCA-related cancers include: ? Breast. ? Ovarian. ? Tubal. ? Peritoneal cancers.  Results of the assessment will determine the need for genetic counseling and BRCA1 and BRCA2 testing.  Cervical Cancer Your health care provider may recommend that you be screened regularly for cancer of the pelvic  organs (ovaries, uterus, and vagina). This screening involves a pelvic examination, including checking for microscopic changes to the surface of your cervix (Pap test). You may be encouraged to have this screening done every 3 years, beginning at age 17.  For women ages 64-65, health care providers may recommend pelvic exams and Pap testing every 3 years, or they may recommend the Pap and pelvic exam, combined with testing for human papilloma virus (HPV), every 5 years. Some types of HPV increase your risk of cervical cancer. Testing for HPV may also be done on women of any age with unclear Pap test results.  Other health care providers may not recommend any screening for nonpregnant women who are considered low risk for pelvic cancer and who do not have symptoms. Ask your health care provider if a screening pelvic exam is right for you.  If you have had past treatment for cervical cancer or a condition that could lead to cancer, you need Pap tests and screening for cancer for at least 20 years after your treatment. If Pap tests have been discontinued, your risk factors (such as having a new sexual partner) need to be reassessed to determine if screening should resume. Some women have medical problems that increase the chance of getting cervical cancer. In these cases, your health care provider may recommend more frequent screening and Pap tests.  Colorectal Cancer  This type of cancer can be detected and often prevented.  Routine colorectal cancer screening usually begins at 72 years of age and continues through 72 years of age.  Your health care provider may recommend screening at an earlier age if you have risk factors for colon cancer.  Your health care provider may also recommend using home test kits to check for hidden blood in the stool.  A small camera at the end of a tube can be used to examine your colon directly (sigmoidoscopy or colonoscopy). This is done to check for the earliest forms  of colorectal cancer.  Routine screening usually begins at age 78.  Direct examination of the colon should be repeated every 5-10 years through 72 years of age. However, you may need to be screened more often if early forms of precancerous polyps or small growths are found.  Skin Cancer  Check your skin from head to toe regularly.  Tell your health care provider about any new moles or changes in moles, especially if there is a change in a mole's shape or color.  Also tell your health care provider if you have a mole that is larger than the size of a pencil eraser.  Always use sunscreen. Apply sunscreen liberally and repeatedly throughout the day.  Protect yourself by wearing long sleeves, pants, a wide-brimmed hat, and sunglasses whenever you are outside.  Heart disease, diabetes, and high blood pressure  High blood pressure causes heart disease and increases the risk of stroke. High blood pressure is more likely to develop in: ? People who have blood  pressure in the high end of the normal range (130-139/85-89 mm Hg). ? People who are overweight or obese. ? People who are African American.  If you are 65-57 years of age, have your blood pressure checked every 3-5 years. If you are 45 years of age or older, have your blood pressure checked every year. You should have your blood pressure measured twice-once when you are at a hospital or clinic, and once when you are not at a hospital or clinic. Record the average of the two measurements. To check your blood pressure when you are not at a hospital or clinic, you can use: ? An automated blood pressure machine at a pharmacy. ? A home blood pressure monitor.  If you are between 10 years and 29 years old, ask your health care provider if you should take aspirin to prevent strokes.  Have regular diabetes screenings. This involves taking a blood sample to check your fasting blood sugar level. ? If you are at a normal weight and have a low risk  for diabetes, have this test once every three years after 72 years of age. ? If you are overweight and have a high risk for diabetes, consider being tested at a younger age or more often. Preventing infection Hepatitis B  If you have a higher risk for hepatitis B, you should be screened for this virus. You are considered at high risk for hepatitis B if: ? You were born in a country where hepatitis B is common. Ask your health care provider which countries are considered high risk. ? Your parents were born in a high-risk country, and you have not been immunized against hepatitis B (hepatitis B vaccine). ? You have HIV or AIDS. ? You use needles to inject street drugs. ? You live with someone who has hepatitis B. ? You have had sex with someone who has hepatitis B. ? You get hemodialysis treatment. ? You take certain medicines for conditions, including cancer, organ transplantation, and autoimmune conditions.  Hepatitis C  Blood testing is recommended for: ? Everyone born from [birth year] through 1965. ? Anyone with known risk factors for hepatitis C.  Sexually transmitted infections (STIs)  You should be screened for sexually transmitted infections (STIs) including gonorrhea and chlamydia if: ? You are sexually active and are younger than 72 years of age. ? You are older than 73 years of age and your health care provider tells you that you are at risk for this type of infection. ? Your sexual activity has changed since you were last screened and you are at an increased risk for chlamydia or gonorrhea. Ask your health care provider if you are at risk.  If you do not have HIV, but are at risk, it may be recommended that you take a prescription medicine daily to prevent HIV infection. This is called pre-exposure prophylaxis (PrEP). You are considered at risk if: ? You are sexually active and do not regularly use condoms or know the HIV status of your partner(s). ? You take drugs by  injection. ? You are sexually active with a partner who has HIV.  Talk with your health care provider about whether you are at high risk of being infected with HIV. If you choose to begin PrEP, you should first be tested for HIV. You should then be tested every 3 months for as long as you are taking PrEP. Pregnancy  If you are premenopausal and you may become pregnant, ask your health care provider about preconception  counseling.  If you may become pregnant, take 400 to 800 micrograms (mcg) of folic acid every day.  If you want to prevent pregnancy, talk to your health care provider about birth control (contraception). Osteoporosis and menopause  Osteoporosis is a disease in which the bones lose minerals and strength with aging. This can result in serious bone fractures. Your risk for osteoporosis can be identified using a bone density scan.  If you are 54 years of age or older, or if you are at risk for osteoporosis and fractures, ask your health care provider if you should be screened.  Ask your health care provider whether you should take a calcium or vitamin D supplement to lower your risk for osteoporosis.  Menopause may have certain physical symptoms and risks.  Hormone replacement therapy may reduce some of these symptoms and risks. Talk to your health care provider about whether hormone replacement therapy is right for you. Follow these instructions at home:  Schedule regular health, dental, and eye exams.  Stay current with your immunizations.  Do not use any tobacco products including cigarettes, chewing tobacco, or electronic cigarettes.  If you are pregnant, do not drink alcohol.  If you are breastfeeding, limit how much and how often you drink alcohol.  Limit alcohol intake to no more than 1 drink per day for nonpregnant women. One drink equals 12 ounces of beer, 5 ounces of wine, or 1 ounces of hard liquor.  Do not use street drugs.  Do not share needles.  Ask  your health care provider for help if you need support or information about quitting drugs.  Tell your health care provider if you often feel depressed.  Tell your health care provider if you have ever been abused or do not feel safe at home. This information is not intended to replace advice given to you by your health care provider. Make sure you discuss any questions you have with your health care provider. Document Released: 10/16/2010 Document Revised: 09/08/2015 Document Reviewed: 01/04/2015 Elsevier Interactive Patient Education  [employer].

## 2017-09-13 NOTE — Patient Instructions (Signed)
Jasmine James , Thank you for taking time to come for your Medicare Wellness Visit. I appreciate your ongoing commitment to your health goals. Please review the following plan we discussed and let me know if I can assist you in the future.   Screening recommendations/referrals: Colonoscopy up to date Mammogram up to date Bone Density up to date Recommended yearly ophthalmology/optometry visit for glaucoma screening and checkup Recommended yearly dental visit for hygiene and checkup  Vaccinations: Influenza vaccine up to date, due 2019 fall season Pneumococcal vaccine up to date, completed Tdap vaccine up to date, due 10/08/2026 Shingles vaccine due, ordered    Advanced directives: Please bring [place] a copy of living will and health care power of attorney  Conditions/risks identified: none  Next appointment: Tyson Dense, RN 09/19/2018 @ 8:30am   Preventive Care 65 Years and Older, Female Preventive care refers to lifestyle choices and visits with your health care provider that can promote health and wellness. What does preventive care include?  A yearly physical exam. This is also called an annual well check.  Dental exams once or twice a year.  Routine eye exams. Ask your health care provider how often you should have your eyes checked.  Personal lifestyle choices, including:  Daily care of your teeth and gums.  Regular physical activity.  Eating a healthy diet.  Avoiding tobacco and drug use.  Limiting alcohol use.  Practicing safe sex.  Taking low-dose aspirin every day.  Taking vitamin and mineral supplements as recommended by your health care provider. What happens during an annual well check? The services and screenings done by your health care provider during your annual well check will depend on your age, overall health, lifestyle risk factors, and family history of disease. Counseling  Your health care provider may ask you questions about your:  Alcohol  use.  Tobacco use.  Drug use.  Emotional well-being.  Home and relationship well-being.  Sexual activity.  Eating habits.  History of falls.  Memory and ability to understand (cognition).  Work and work Statistician.  Reproductive health. Screening  You may have the following tests or measurements:  Height, weight, and BMI.  Blood pressure.  Lipid and cholesterol levels. These may be checked every 5 years, or more frequently if you are over 4 years old.  Skin check.  Lung cancer screening. You may have this screening every year starting at age 55 if you have a 30-pack-year history of smoking and currently smoke or have quit within the past 15 years.  Fecal occult blood test (FOBT) of the stool. You may have this test every year starting at age 61.  Flexible sigmoidoscopy or colonoscopy. You may have a sigmoidoscopy every 5 years or a colonoscopy every 10 years starting at age 45.  Hepatitis C blood test.  Hepatitis B blood test.  Sexually transmitted disease (STD) testing.  Diabetes screening. This is done by checking your blood sugar (glucose) after you have not eaten for a while (fasting). You may have this done every 1-3 years.  Bone density scan. This is done to screen for osteoporosis. You may have this done starting at age 21.  Mammogram. This may be done every 1-2 years. Talk to your health care provider about how often you should have regular mammograms. Talk with your health care provider about your test results, treatment options, and if necessary, the need for more tests. Vaccines  Your health care provider may recommend certain vaccines, such as:  Influenza vaccine. This is  recommended every year.  Tetanus, diphtheria, and acellular pertussis (Tdap, Td) vaccine. You may need a Td booster every 10 years.  Zoster vaccine. You may need this after age 38.  Pneumococcal 13-valent conjugate (PCV13) vaccine. One dose is recommended after age  62.  Pneumococcal polysaccharide (PPSV23) vaccine. One dose is recommended after age 50. Talk to your health care provider about which screenings and vaccines you need and how often you need them. This information is not intended to replace advice given to you by your health care provider. Make sure you discuss any questions you have with your health care provider. Document Released: 04/29/2015 Document Revised: 12/21/2015 Document Reviewed: 02/01/2015 Elsevier Interactive Patient Education  2017 [place] Prevention in the Home Falls can cause injuries. They can happen to people of all ages. There are many things you can do to make your home safe and to help prevent falls. What can I do on the outside of my home?  Regularly fix the edges of walkways and driveways and fix any cracks.  Remove anything that might make you trip as you walk through a door, such as a raised step or threshold.  Trim any bushes or trees on the path to your home.  Use bright outdoor lighting.  Clear any walking paths of anything that might make someone trip, such as rocks or tools.  Regularly check to see if handrails are loose or broken. Make sure that both sides of any steps have handrails.  Any raised decks and porches should have guardrails on the edges.  Have any leaves, snow, or ice cleared regularly.  Use sand or salt on walking paths during winter.  Clean up any spills in your garage right away. This includes oil or grease spills. What can I do in the bathroom?  Use night lights.  Install grab bars by the toilet and in the tub and shower. Do not use towel bars as grab bars.  Use non-skid mats or decals in the tub or shower.  If you need to sit down in the shower, use a plastic, non-slip stool.  Keep the floor dry. Clean up any water that spills on the floor as soon as it happens.  Remove soap buildup in the tub or shower regularly.  Attach bath mats securely with double-sided  non-slip rug tape.  Do not have throw rugs and other things on the floor that can make you trip. What can I do in the bedroom?  Use night lights.  Make sure that you have a light by your bed that is easy to reach.  Do not use any sheets or blankets that are too big for your bed. They should not hang down onto the floor.  Have a firm chair that has side arms. You can use this for support while you get dressed.  Do not have throw rugs and other things on the floor that can make you trip. What can I do in the kitchen?  Clean up any spills right away.  Avoid walking on wet floors.  Keep items that you use a lot in easy-to-reach places.  If you need to reach something above you, use a strong step stool that has a grab bar.  Keep electrical cords out of the way.  Do not use floor polish or wax that makes floors slippery. If you must use wax, use non-skid floor wax.  Do not have throw rugs and other things on the floor that can make you trip.  What can I do with my stairs?  Do not leave any items on the stairs.  Make sure that there are handrails on both sides of the stairs and use them. Fix handrails that are broken or loose. Make sure that handrails are as long as the stairways.  Check any carpeting to make sure that it is firmly attached to the stairs. Fix any carpet that is loose or worn.  Avoid having throw rugs at the top or bottom of the stairs. If you do have throw rugs, attach them to the floor with carpet tape.  Make sure that you have a light switch at the top of the stairs and the bottom of the stairs. If you do not have them, ask someone to add them for you. What else can I do to help prevent falls?  Wear shoes that:  Do not have high heels.  Have rubber bottoms.  Are comfortable and fit you well.  Are closed at the toe. Do not wear sandals.  If you use a stepladder:  Make sure that it is fully opened. Do not climb a closed stepladder.  Make sure that both  sides of the stepladder are locked into place.  Ask someone to hold it for you, if possible.  Clearly mark and make sure that you can see:  Any grab bars or handrails.  First and last steps.  Where the edge of each step is.  Use tools that help you move around (mobility aids) if they are needed. These include:  Canes.  Walkers.  Scooters.  Crutches.  Turn on the lights when you go into a dark area. Replace any light bulbs as soon as they burn out.  Set up your furniture so you have a clear path. Avoid moving your furniture around.  If any of your floors are uneven, fix them.  If there are any pets around you, be aware of where they are.  Review your medicines with your doctor. Some medicines can make you feel dizzy. This can increase your chance of falling. Ask your doctor what other things that you can do to help prevent falls. This information is not intended to replace advice given to you by your health care provider. Make sure you discuss any questions you have with your health care provider. Document Released: 01/27/2009 Document Revised: 09/08/2015 Document Reviewed: 05/07/2014 Elsevier Interactive Patient Education  2017 [employer].

## 2017-09-14 LAB — MICROALBUMIN, URINE: Microalb, Ur: 0.8 mg/dL

## 2017-09-16 ENCOUNTER — Telehealth: Payer: Self-pay

## 2017-09-16 NOTE — Telephone Encounter (Signed)
Patient called to schedule an appointment for November. Records were placed in the MA folder #[id number]

## 2017-09-16 NOTE — Telephone Encounter (Signed)
I left a message for patient to call the office to schedule a 6 month routine visit with Dr. Eulas Post around 03/15/18.   Patient records were received for this patient as well. They will be abstracted and placed in Korri Ask's folder in the MA filing cabinet until appt can be scheduled.

## 2017-09-26 ENCOUNTER — Other Ambulatory Visit: Payer: PPO

## 2017-09-26 DIAGNOSIS — R945 Abnormal results of liver function studies: Principal | ICD-10-CM

## 2017-09-26 DIAGNOSIS — R7989 Other specified abnormal findings of blood chemistry: Secondary | ICD-10-CM

## 2017-09-26 LAB — COMPLETE METABOLIC PANEL WITH GFR
AG RATIO: 1.8 (calc) (ref 1.0–2.5)
ALT: 34 U/L — ABNORMAL HIGH (ref 6–29)
AST: 32 U/L (ref 10–35)
Albumin: 4.7 g/dL (ref 3.6–5.1)
Alkaline phosphatase (APISO): 89 U/L (ref 33–130)
BUN: 19 mg/dL (ref 7–25)
CO2: 27 mmol/L (ref 20–32)
CREATININE: 0.78 mg/dL (ref 0.60–0.93)
Calcium: 10 mg/dL (ref 8.6–10.4)
Chloride: 104 mmol/L (ref 98–110)
GFR, EST AFRICAN AMERICAN: 89 mL/min/{1.73_m2} (ref >=60)
GFR, EST NON AFRICAN AMERICAN: 76 mL/min/{1.73_m2} (ref >=60)
GLOBULIN: 2.6 g/dL (ref 1.9–3.7)
Glucose, Bld: 102 mg/dL (ref 65–139)
POTASSIUM: 4.2 mmol/L (ref 3.5–5.3)
SODIUM: 139 mmol/L (ref 135–146)
TOTAL PROTEIN: 7.3 g/dL (ref 6.1–8.1)
Total Bilirubin: 0.4 mg/dL (ref 0.2–1.2)

## 2017-10-03 DIAGNOSIS — H902 Conductive hearing loss, unspecified: Secondary | ICD-10-CM | POA: Diagnosis not present

## 2017-10-03 DIAGNOSIS — H6522 Chronic serous otitis media, left ear: Secondary | ICD-10-CM | POA: Diagnosis not present

## 2017-11-14 ENCOUNTER — Encounter: Payer: Self-pay | Admitting: Nurse Practitioner

## 2018-01-23 DIAGNOSIS — Z6827 Body mass index (BMI) 27.0-27.9, adult: Secondary | ICD-10-CM | POA: Diagnosis not present

## 2018-01-23 DIAGNOSIS — Z1231 Encounter for screening mammogram for malignant neoplasm of breast: Secondary | ICD-10-CM | POA: Diagnosis not present

## 2018-01-23 DIAGNOSIS — Z124 Encounter for screening for malignant neoplasm of cervix: Secondary | ICD-10-CM | POA: Diagnosis not present

## 2018-01-29 ENCOUNTER — Encounter (INDEPENDENT_AMBULATORY_CARE_PROVIDER_SITE_OTHER): Payer: Self-pay | Admitting: Orthopaedic Surgery

## 2018-01-29 ENCOUNTER — Ambulatory Visit (INDEPENDENT_AMBULATORY_CARE_PROVIDER_SITE_OTHER): Payer: PPO | Admitting: Orthopaedic Surgery

## 2018-01-29 ENCOUNTER — Ambulatory Visit (INDEPENDENT_AMBULATORY_CARE_PROVIDER_SITE_OTHER): Payer: PPO

## 2018-01-29 DIAGNOSIS — M722 Plantar fascial fibromatosis: Secondary | ICD-10-CM

## 2018-01-29 NOTE — Progress Notes (Signed)
Office Visit Note   Patient: Jasmine James           Date of Birth: 04-Jan-1946           MRN: [id number] Visit Date: 01/29/2018              Requested by: [name], NP [place], [place] [address] PCP: [name], NP   Assessment & Plan: Visit Diagnoses:  1. Plantar fasciitis of left foot     Plan: Impression is left foot plantar fasciitis.  Today, provided the patient with Achilles stretching handout.  I have reinforced to her that this can take several months before her symptoms improved.  She has already bought a pair of shoes with good support but she will purchase a pair of orthotics as well.  We have discussed cortisone injection if she would like to proceed at any point.  She will follow-up with [place] as needed.  Call with concerns or questions.  Follow-Up Instructions: Return if symptoms worsen or fail to improve.   Orders:  Orders Placed This Encounter  Procedures  . XR Os Calcis Left   No orders of the defined types were placed in this encounter.     Procedures: No procedures performed   Clinical Data: No additional findings.   Subjective: Chief Complaint  Patient presents with  . left heel pain    HPI patient is a pleasant 72 year old female who presents to our clinic today with left heel pain.  This began a few weeks back after she went to an event wearing flat shoes and did a lot of walking.  All of the pain she has is to the heel.  Worse first thing in the morning when she gets out of bed and at the end of the day.  She did recently purchase a new pair of new balance tennis shoes which she has been wearing.  These seem to make her symptoms more tolerable.  No numbness, tingling or burning.  Review of Systems as detailed in HPI.  All others reviewed and are negative.   Objective: Vital Signs: There were no vitals taken for this visit.  Physical Exam well-developed and well-nourished female in no acute distress.  Alert and  oriented x3.  Ortho Exam examination of her left heel reveals marked tenderness over the plantar fascial insertion.  She can dorsiflex to about 10 degrees.  Specialty Comments:  No specialty comments available.  Imaging: Xr Os Calcis Left  Result Date: 01/29/2018 She does have a small spur at the plantar fascial insertion, otherwise no acute findings    PMFS History: Patient Active Problem List   Diagnosis Date Noted  . Plantar fasciitis of left foot 01/29/2018  . Closed nondisplaced fracture of fifth left metatarsal bone 10/08/2016  . Controlled type 2 diabetes mellitus without complication, without long-term current use of insulin ([place]) 07/21/2015  . Hyperlipidemia LDL goal <70 07/21/2015  . Essential hypertension, benign 07/21/2015  . Osteopenia 10/25/2013  . Muscle pain 07/24/2012   Past Medical History:  Diagnosis Date  . Acute bronchitis   . Disorder of bone and cartilage, unspecified   . Elevated blood pressure reading without diagnosis of hypertension   . Myalgia and myositis, unspecified   . Osteoporosis, unspecified   . Other abnormal blood chemistry   . Other and unspecified hyperlipidemia   . Plantar fascial fibromatosis   . Type II or unspecified type diabetes mellitus without mention of complication, not stated  as uncontrolled     History reviewed. No pertinent family history.  Past Surgical History:  Procedure Laterality Date  . BREAST SURGERY  1997   reduction  . EYE SURGERY     Left eye contracts removed   Social History   Occupational History  . Not on file  Tobacco Use  . Smoking status: Never Smoker  . Smokeless tobacco: Never Used  Substance and Sexual Activity  . Alcohol use: Yes    Comment: glass wine every third day  . Drug use: No  . Sexual activity: Not Currently

## 2018-01-30 ENCOUNTER — Ambulatory Visit (INDEPENDENT_AMBULATORY_CARE_PROVIDER_SITE_OTHER): Payer: PPO | Admitting: Orthopaedic Surgery

## 2018-02-13 DIAGNOSIS — R87615 Unsatisfactory cytologic smear of cervix: Secondary | ICD-10-CM | POA: Diagnosis not present

## 2018-02-16 ENCOUNTER — Other Ambulatory Visit: Payer: Self-pay | Admitting: Nurse Practitioner

## 2018-02-25 ENCOUNTER — Other Ambulatory Visit: Payer: Self-pay | Admitting: Nurse Practitioner

## 2018-03-07 ENCOUNTER — Encounter: Payer: Self-pay | Admitting: Nurse Practitioner

## 2018-03-07 ENCOUNTER — Ambulatory Visit (INDEPENDENT_AMBULATORY_CARE_PROVIDER_SITE_OTHER): Payer: PPO | Admitting: Nurse Practitioner

## 2018-03-07 ENCOUNTER — Ambulatory Visit: Payer: Self-pay | Admitting: Internal Medicine

## 2018-03-07 VITALS — BP 150/98 | HR 91 | Temp 98.0°F | Ht 63.0 in | Wt 151.0 lb

## 2018-03-07 DIAGNOSIS — Z6826 Body mass index (BMI) 26.0-26.9, adult: Secondary | ICD-10-CM

## 2018-03-07 DIAGNOSIS — E785 Hyperlipidemia, unspecified: Secondary | ICD-10-CM | POA: Diagnosis not present

## 2018-03-07 DIAGNOSIS — R945 Abnormal results of liver function studies: Secondary | ICD-10-CM | POA: Diagnosis not present

## 2018-03-07 DIAGNOSIS — E663 Overweight: Secondary | ICD-10-CM | POA: Diagnosis not present

## 2018-03-07 DIAGNOSIS — E119 Type 2 diabetes mellitus without complications: Secondary | ICD-10-CM | POA: Diagnosis not present

## 2018-03-07 DIAGNOSIS — M1990 Unspecified osteoarthritis, unspecified site: Secondary | ICD-10-CM | POA: Diagnosis not present

## 2018-03-07 DIAGNOSIS — I10 Essential (primary) hypertension: Secondary | ICD-10-CM | POA: Diagnosis not present

## 2018-03-07 DIAGNOSIS — R7989 Other specified abnormal findings of blood chemistry: Secondary | ICD-10-CM

## 2018-03-07 NOTE — Progress Notes (Signed)
Careteam: Patient Care Team: [name], NP as PCP - General (Nurse Practitioner)  Advanced Directive information Does Patient Have a Medical Advance Directive?: Yes, Type of Advance Directive: [place];Living will  Allergies  Allergen Reactions  . Codeine   . Sulfa Antibiotics     Chief Complaint  Patient presents with  . Medical Management of Chronic Issues    Pt is being seen for a 6 month routine visit. Pt has no concerns today.      HPI: Patient is a 72 y.o. female seen in the office today routine follow up  htn- blood pressure elevated today however was started on ACEi due to diabetes and slightly elevated blood pressure and could not tolerate medication.  She takes blood pressure at home and it is never over 140, around 128/80s Feels like it is always up at doctor office.   Dm- on metformin 500 mg daily - had diabetic eye exam 05/25/17  OA- using tylenol arthritis 650 mg 2 tablets daily. Continues to exercise   Hyperlipidemia- on Zocor, last LDL 80   Plantar fascitis in left heel- sees Dr Erlinda Hong at [facility] orthopedic and offered her and injection but she did not wish to get it done, has been doing the exercise.   Recently had PAP, [facility]  Ear pain spontaneously resolved. Went to ENT Review of Systems:  Review of Systems  Constitutional: Negative for chills, fever and malaise/fatigue.  HENT: Negative for congestion and hearing loss.   Eyes: Negative for blurred vision.  Respiratory: Negative for cough and shortness of breath.   Cardiovascular: Negative for chest pain and leg swelling.  Gastrointestinal: Negative for blood in stool, constipation, heartburn and melena.  Genitourinary: Negative for dysuria, frequency and urgency.  Musculoskeletal: Positive for joint pain. Negative for falls and myalgias.       Occasional knee pain  Skin: Negative for rash.  Neurological: Negative for dizziness, loss of  consciousness and weakness.  Psychiatric/Behavioral: Negative for depression and memory loss.    Past Medical History:  Diagnosis Date  . Acute bronchitis   . Disorder of bone and cartilage, unspecified   . Elevated blood pressure reading without diagnosis of hypertension   . Myalgia and myositis, unspecified   . Osteoporosis, unspecified   . Other abnormal blood chemistry   . Other and unspecified hyperlipidemia   . Plantar fascial fibromatosis   . Type II or unspecified type diabetes mellitus without mention of complication, not stated as uncontrolled    Past Surgical History:  Procedure Laterality Date  . BREAST SURGERY  1997   reduction  . EYE SURGERY     Left eye contracts removed   Social History:   reports that she has never smoked. She has never used smokeless tobacco. She reports that she drinks alcohol. She reports that she does not use drugs.  History reviewed. No pertinent family history.  Medications: Patient's Medications  New Prescriptions   No medications on file  Previous Medications   ACETAMINOPHEN (TYLENOL 8 HOUR ARTHRITIS PAIN) 650 MG CR TABLET    Take 1,300 mg by mouth daily as needed for pain.   CHOLECALCIFEROL (VITAMIN D) 1000 UNITS TABLET    Take 5,000 Units by mouth daily.    METFORMIN (GLUCOPHAGE) 500 MG TABLET    TAKE 1 TABLET BY MOUTH ONCE DAILY WITH BREAKFAST TO CONTROL SUGAR   SIMVASTATIN (ZOCOR) 20 MG TABLET    TAKE 1 TABLET BY MOUTH EVERY  EVENING TO LOWER CHOLESTROL  Modified Medications   No medications on file  Discontinued Medications   No medications on file     Physical Exam:  Vitals:   03/07/18 1017  BP: (!) 150/98  Pulse: 91  Temp: 98 F (36.7 C)  TempSrc: Oral  SpO2: 97%  Weight: 151 lb (68.5 kg)  Height: 5' 3"  (1.6 m)   Body mass index is 26.75 kg/m.  Physical Exam  Constitutional: She is oriented to person, place, and time. She appears well-developed and well-nourished. No distress.  HENT:  Head: Normocephalic  and atraumatic.  Nose: Nose normal.  Mouth/Throat: No oropharyngeal exudate.  Eyes: Pupils are equal, round, and reactive to light. Conjunctivae and EOM are normal.  Neck: Normal range of motion. Neck supple. No JVD present. No thyromegaly present.  Cardiovascular: Normal rate, regular rhythm, normal heart sounds and intact distal pulses.  Pulmonary/Chest: Effort normal and breath sounds normal. No respiratory distress.  Abdominal: Bowel sounds are normal. She exhibits no distension. There is no tenderness.  Musculoskeletal: Normal range of motion. She exhibits no edema or tenderness.  Neurological: She is alert and oriented to person, place, and time. She has normal reflexes. No cranial nerve deficit.  Skin: Skin is warm and dry. She is not diaphoretic. No erythema. No pallor.  Psychiatric: She has a normal mood and affect.   Labs reviewed: Basic Metabolic Panel: Recent Labs    09/05/17 0817 09/26/17 0912  NA 140 139  K 4.1 4.2  CL 106 104  CO2 24 27  GLUCOSE 105* 102  BUN 15 19  CREATININE 0.65 0.78  CALCIUM 9.6 10.0   Liver Function Tests: Recent Labs    09/05/17 0817 09/26/17 0912  AST 37* 32  ALT 37* 34*  BILITOT 0.5 0.4  PROT 7.1 7.3   No results for input(s): LIPASE, AMYLASE in the last 8760 hours. No results for input(s): AMMONIA in the last 8760 hours. CBC: Recent Labs    09/05/17 0817  WBC 4.6  NEUTROABS 1,996  HGB 12.9  HCT 37.8  MCV 84.8  PLT 272   Lipid Panel: Recent Labs    09/05/17 0817  CHOL 137  HDL 41*  LDLCALC 80  TRIG 77  CHOLHDL 3.3   TSH: No results for input(s): TSH in the last 8760 hours. A1C: Lab Results  Component Value Date   HGBA1C 5.6 09/05/2017     Assessment/Plan 1. Overweight with body mass index (BMI) 25.0-29.9 Noted today  2. Body mass index 26.0-26.9, adult Continue exercise with healthy food choices   3. Essential hypertension, benign -elevated blood pressure noted during OV however reports better at  home and controlled. Attempted to start her on ACEi prior and blood pressure became too low, unable to tolerate. Continue DASH diet.   4. Controlled type 2 diabetes mellitus without complication, without long-term current use of insulin ([facility]) Controlled on current regimem  - Hemoglobin A1c - BMP with eGFR([facility])  5. Hyperlipidemia LDL goal <70 On zocor, LDL 80, encouraged lifestyle modifications for goal <70  6. Osteoarthritis, unspecified osteoarthritis type, unspecified site Controlled at this time, following with Dr Erlinda Hong, uses tylenol daily.  7. Elevated liver function tests Will follow up - Hepatic Function Panel  Next appt: 08/29/2018 [employer]. [place], [facility] [phone]

## 2018-03-08 LAB — BASIC METABOLIC PANEL WITH GFR
BUN: 17 mg/dL (ref 7–25)
CALCIUM: 10 mg/dL (ref 8.6–10.4)
CO2: 28 mmol/L (ref 20–32)
CREATININE: 0.79 mg/dL (ref 0.60–0.93)
Chloride: 104 mmol/L (ref 98–110)
GFR, EST NON AFRICAN AMERICAN: 75 mL/min/{1.73_m2} (ref >=60)
GFR, Est African American: 87 mL/min/{1.73_m2} (ref >=60)
Glucose, Bld: 99 mg/dL (ref 65–139)
Potassium: 4 mmol/L (ref 3.5–5.3)
Sodium: 139 mmol/L (ref 135–146)

## 2018-03-08 LAB — HEPATIC FUNCTION PANEL
AG RATIO: 1.7 (calc) (ref 1.0–2.5)
ALBUMIN MSPROF: 4.8 g/dL (ref 3.6–5.1)
ALT: 33 U/L — AB (ref 6–29)
AST: 36 U/L — ABNORMAL HIGH (ref 10–35)
Alkaline phosphatase (APISO): 88 U/L (ref 33–130)
BILIRUBIN TOTAL: 0.4 mg/dL (ref 0.2–1.2)
Bilirubin, Direct: 0.1 mg/dL (ref 0.0–0.2)
Globulin: 2.8 g/dL (calc) (ref 1.9–3.7)
Indirect Bilirubin: 0.3 mg/dL (calc) (ref 0.2–1.2)
Total Protein: 7.6 g/dL (ref 6.1–8.1)

## 2018-03-08 LAB — HEMOGLOBIN A1C
EAG (MMOL/L): 6.8 (calc)
Hgb A1c MFr Bld: 5.9 % of total Hgb — ABNORMAL HIGH (ref <5.7)
Mean Plasma Glucose: 123 (calc)

## 2018-03-12 ENCOUNTER — Telehealth: Payer: Self-pay | Admitting: *Deleted

## 2018-03-12 NOTE — Telephone Encounter (Signed)
Great thank you!

## 2018-03-12 NOTE — Telephone Encounter (Signed)
Received a fax from patient regarding her Blood Pressure Readings:  03/07/2018- 122/78  Pulse 93 03/08/2018- 123/77  Pulse 77 03/09/2018- 130/86  Pulse 75 03/10/2018- 142/86  Pulse 85 03/11/2018- 139/92  Pulse 76

## 2018-03-20 DIAGNOSIS — J370 Chronic laryngitis: Secondary | ICD-10-CM | POA: Diagnosis not present

## 2018-03-20 DIAGNOSIS — J3489 Other specified disorders of nose and nasal sinuses: Secondary | ICD-10-CM | POA: Diagnosis not present

## 2018-03-20 DIAGNOSIS — J322 Chronic ethmoidal sinusitis: Secondary | ICD-10-CM | POA: Diagnosis not present

## 2018-03-20 DIAGNOSIS — J320 Chronic maxillary sinusitis: Secondary | ICD-10-CM | POA: Diagnosis not present

## 2018-03-25 DIAGNOSIS — J320 Chronic maxillary sinusitis: Secondary | ICD-10-CM | POA: Diagnosis not present

## 2018-03-25 DIAGNOSIS — J322 Chronic ethmoidal sinusitis: Secondary | ICD-10-CM | POA: Diagnosis not present

## 2018-05-20 ENCOUNTER — Emergency Department (HOSPITAL_BASED_OUTPATIENT_CLINIC_OR_DEPARTMENT_OTHER)
Admission: EM | Admit: 2018-05-20 | Discharge: 2018-05-20 | Disposition: A | Discharge status: Home / Self Care | Payer: PPO | Attending: Emergency Medicine | Admitting: Emergency Medicine

## 2018-05-20 ENCOUNTER — Other Ambulatory Visit: Payer: Self-pay

## 2018-05-20 ENCOUNTER — Encounter (HOSPITAL_BASED_OUTPATIENT_CLINIC_OR_DEPARTMENT_OTHER): Payer: Self-pay | Admitting: *Deleted

## 2018-05-20 ENCOUNTER — Emergency Department (HOSPITAL_BASED_OUTPATIENT_CLINIC_OR_DEPARTMENT_OTHER): Payer: PPO

## 2018-05-20 DIAGNOSIS — Y929 Unspecified place or not applicable: Secondary | ICD-10-CM | POA: Insufficient documentation

## 2018-05-20 DIAGNOSIS — Y998 Other external cause status: Secondary | ICD-10-CM | POA: Diagnosis not present

## 2018-05-20 DIAGNOSIS — S92424A Nondisplaced fracture of distal phalanx of right great toe, initial encounter for closed fracture: Secondary | ICD-10-CM | POA: Diagnosis not present

## 2018-05-20 DIAGNOSIS — Z79899 Other long term (current) drug therapy: Secondary | ICD-10-CM | POA: Insufficient documentation

## 2018-05-20 DIAGNOSIS — S92421A Displaced fracture of distal phalanx of right great toe, initial encounter for closed fracture: Secondary | ICD-10-CM | POA: Insufficient documentation

## 2018-05-20 DIAGNOSIS — E119 Type 2 diabetes mellitus without complications: Secondary | ICD-10-CM | POA: Diagnosis not present

## 2018-05-20 DIAGNOSIS — Y9301 Activity, walking, marching and hiking: Secondary | ICD-10-CM | POA: Diagnosis not present

## 2018-05-20 DIAGNOSIS — Z7984 Long term (current) use of oral hypoglycemic drugs: Secondary | ICD-10-CM | POA: Diagnosis not present

## 2018-05-20 DIAGNOSIS — I10 Essential (primary) hypertension: Secondary | ICD-10-CM | POA: Diagnosis not present

## 2018-05-20 DIAGNOSIS — S99921A Unspecified injury of right foot, initial encounter: Secondary | ICD-10-CM | POA: Diagnosis present

## 2018-05-20 DIAGNOSIS — W228XXA Striking against or struck by other objects, initial encounter: Secondary | ICD-10-CM | POA: Diagnosis not present

## 2018-05-20 MED ORDER — CEPHALEXIN 250 MG PO CAPS: 250.0000 mg | ORAL_CAPSULE | Freq: Four times a day (QID) | ORAL | 0 refills | Status: DC

## 2018-05-20 NOTE — ED Notes (Signed)
Patient transported to X-ray 

## 2018-05-20 NOTE — Discharge Instructions (Addendum)
Follow-up with your orthopedic doctor, Jasmine James tape your toes and use the hard soled shoe, as we discussed, I do not think you have any infection at this time.  Hold onto the antibiotic prescription and if you start having trouble with fevers or chills or increasing redness then start taking antibiotics.

## 2018-05-20 NOTE — ED Triage Notes (Signed)
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

## 2018-05-20 NOTE — ED Provider Notes (Signed)
[facility] EMERGENCY DEPARTMENT Provider Note   CSN: [id number] Arrival date & time: 05/20/18  0847     History   Chief Complaint Chief Complaint  Patient presents with  . Foot Pain    HPI LYNESHA BANGO is a 73 y.o. female.  HPI Presents to the emergency room for evaluation of a toe injury.  Patient states she was on vacation and stubbed her toe about a week ago.  [place]  [place]  She was told most likely was a bruise.  Patient did not have any x-rays done.  Patient states immediately after the injury she noticed a dark discoloration under the skin at the distal aspect of her right great toe right below her toenail.  She also noticed some discoloration at the base of her toenail.  She has noticed bruising and swelling at the base of her toe more towards the proximal aspect.  This seems to be getting a bit better.  She denies any pain.  No fevers or chills.  Patient is a diabetic and was concerned about the possibility of infection. Past Medical History:  Diagnosis Date  . Acute bronchitis   . Disorder of bone and cartilage, unspecified   . Elevated blood pressure reading without diagnosis of hypertension   . Myalgia and myositis, unspecified   . Osteoporosis, unspecified   . Other abnormal blood chemistry   . Other and unspecified hyperlipidemia   . Plantar fascial fibromatosis   . Type II or unspecified type diabetes mellitus without mention of complication, not stated as uncontrolled     Patient Active Problem List   Diagnosis Date Noted  . Plantar fasciitis of left foot 01/29/2018  . Closed nondisplaced fracture of fifth left metatarsal bone 10/08/2016  . Controlled type 2 diabetes mellitus without complication, without long-term current use of insulin ([place]) 07/21/2015  . Hyperlipidemia LDL goal <70 07/21/2015  . Essential hypertension, benign 07/21/2015  . Osteopenia 10/25/2013  . Muscle  pain 07/24/2012    Past Surgical History:  Procedure Laterality Date  . BREAST SURGERY  1997   reduction  . EYE SURGERY     Left eye contracts removed     OB History   No obstetric history on file.      Home Medications    Prior to Admission medications   Medication Sig Start Date End Date Taking? Authorizing Provider  acetaminophen (TYLENOL 8 HOUR ARTHRITIS PAIN) 650 MG CR tablet Take 1,300 mg by mouth daily as needed for pain.    [provider]  cephALEXin (KEFLEX) 250 MG capsule Take 1 capsule (250 mg total) by mouth 4 (four) times daily. 05/20/18   Dorie Rank, MD  cholecalciferol (VITAMIN D) 1000 units tablet Take 5,000 Units by mouth daily.     [provider]  metFORMIN (GLUCOPHAGE) 500 MG tablet TAKE 1 TABLET BY MOUTH ONCE DAILY WITH BREAKFAST TO CONTROL SUGAR 02/25/18   [name], NP  simvastatin (ZOCOR) 20 MG tablet TAKE 1 TABLET BY MOUTH EVERY EVENING TO LOWER CHOLESTROL 02/17/18   [name], NP    Family History No family history on file.  Social History Social History   Tobacco Use  . Smoking status: Never Smoker  . Smokeless tobacco: Never Used  Substance Use Topics  . Alcohol use: Yes    Comment: glass wine every third day  . Drug use: No     Allergies  Codeine and Sulfa antibiotics   Review of Systems Review of Systems  All other systems reviewed and are negative.    Physical Exam Updated Vital Signs BP (!) 160/94 (BP Location: Left Arm)   Pulse 94   Temp 98.3 F (36.8 C) (Oral)   Resp 18   Ht 1.575 m (5\' 2" )   Wt 67.6 kg   BMI 27.25 kg/m   Physical Exam Vitals signs and nursing note reviewed.  Constitutional:      General: She is not in acute distress.    Appearance: She is well-developed.  HENT:     Head: Normocephalic and atraumatic.     Right Ear: External ear normal.     Left Ear: External ear normal.  Eyes:     General: No scleral icterus.       Right eye: No discharge.        Left  eye: No discharge.     Conjunctiva/sclera: Conjunctivae normal.  Neck:     Musculoskeletal: Neck supple.     Trachea: No tracheal deviation.  Cardiovascular:     Rate and Rhythm: Normal rate.  Pulmonary:     Effort: Pulmonary effort is normal. No respiratory distress.     Breath sounds: No stridor.  Abdominal:     General: There is no distension.  Musculoskeletal:        General: No swelling or deformity.     Right foot: Swelling present. No laceration.     Comments: Area of dark discoloration under the skin at the tip of the right great toe right below the toenail, some ecchymoses also noted throughout the toe and darker discoloration at the base of the nail, strong dorsalis pedis pulse, foot is warm and well-perfused  Skin:    General: Skin is warm and dry.     Findings: No rash.  Neurological:     Mental Status: She is alert.     Cranial Nerves: Cranial nerve deficit: no gross deficits.      ED Treatments / Results  Labs (all labs ordered are listed, but only abnormal results are displayed) Labs Reviewed - No data to display  EKG None  Radiology Dg Toe Great Right  Result Date: 05/20/2018 CLINICAL DATA:  Stubbed great toe week ago. EXAM: RIGHT GREAT TOE COMPARISON:  None. FINDINGS: Nondisplaced fracture of the mid first distal phalanx and tuft. Mild osteoarthritis of the second DIP joint. Remaining joint spaces are preserved. Bone mineralization is normal. Mild soft tissue swelling of the great toe. IMPRESSION: 1. Nondisplaced fracture of the mid first distal phalanx and tuft. Electronically Signed   By: Titus Dubin M.D.   On: 05/20/2018 09:50    Procedures Procedures (including critical care time)  Medications Ordered in ED Medications - No data to display   Initial Impression / Assessment and Plan / ED Course  I have reviewed the triage vital signs and the nursing notes.  Pertinent labs & imaging results that were available during my care of the patient were  reviewed by me and considered in my medical decision making (see chart for details).   Patient's x-rays show a fracture of her right great toe.  I suspect the patient's symptoms are related to that toe injury.  She has a hematoma underneath the skin but I doubt any acute vascular injury.  I doubt any infection.  Patient is a diabetic and was concerned about the redness.  I will give her a prescription for antibiotics but I will  have her hold onto that.  If she starts having increasing redness warmth or other symptoms infections then she can start taking that antibiotic.  She will follow-up with her orthopedic doctor.  Buddy tape and hard sole shoe ordered  Final Clinical Impressions(s) / ED Diagnoses   Final diagnoses:  Closed displaced fracture of distal phalanx of right great toe, initial encounter    ED Discharge Orders         Ordered    cephALEXin (KEFLEX) 250 MG capsule  4 times daily     05/20/18 1016           Dorie Rank, MD 05/20/18 1018

## 2018-06-03 ENCOUNTER — Encounter (INDEPENDENT_AMBULATORY_CARE_PROVIDER_SITE_OTHER): Payer: Self-pay | Admitting: Orthopaedic Surgery

## 2018-06-03 ENCOUNTER — Ambulatory Visit (INDEPENDENT_AMBULATORY_CARE_PROVIDER_SITE_OTHER): Payer: PPO

## 2018-06-03 ENCOUNTER — Ambulatory Visit (INDEPENDENT_AMBULATORY_CARE_PROVIDER_SITE_OTHER): Payer: PPO | Admitting: Orthopaedic Surgery

## 2018-06-03 DIAGNOSIS — S92424A Nondisplaced fracture of distal phalanx of right great toe, initial encounter for closed fracture: Secondary | ICD-10-CM

## 2018-06-03 NOTE — Progress Notes (Signed)
Office Visit Note   Patient: Jasmine James           Date of Birth: [birth year]           MRN: [id number] Visit Date: 06/03/2018              Requested by: [name], NP [place], [place] [id number] PCP: [name], NP   Assessment & Plan: Visit Diagnoses:  1. Nondisplaced fracture of distal phalanx of right great toe, initial encounter for closed fracture     Plan: Fracture appears to be stable and healing.  At this point we will place her in a postop shoe which she may wean as she feels comfortable.  I would like to see her back in 3 weeks with repeat 2 view x-rays of the right great toe.  Questions encouraged and answered.  Follow-Up Instructions: Return in about 3 weeks (around 06/24/2018).   Orders:  Orders Placed This Encounter  Procedures  . XR Toe Great Right   No orders of the defined types were placed in this encounter.     Procedures: No procedures performed   Clinical Data: No additional findings.   Subjective: Chief Complaint  Patient presents with  . Right Great Toe - Pain    Jasmine James is a 73 year old female who I have seen in the past and comes in with a new injury to her right great toe.  She stubbed it about 3 weeks ago and sustained a nondisplaced distal phalanx fracture.  She was placed on Keflex prophylactically.  She does have swelling in the toe and a hematoma.  Denies any constitutional symptoms.   Review of Systems  Constitutional: Negative.   HENT: Negative.   Eyes: Negative.   Respiratory: Negative.   Cardiovascular: Negative.   Endocrine: Negative.   Musculoskeletal: Negative.   Neurological: Negative.   Hematological: Negative.   Psychiatric/Behavioral: Negative.   All other systems reviewed and are negative.    Objective: Vital Signs: There were no vitals taken for this visit.  Physical Exam Vitals signs and nursing note reviewed.  Constitutional:      Appearance: She is well-developed.    Pulmonary:     Effort: Pulmonary effort is normal.  Skin:    General: Skin is warm.     Capillary Refill: Capillary refill takes less than 2 seconds.  Neurological:     Mental Status: She is alert and oriented to person, place, and time.  Psychiatric:        Behavior: Behavior normal.        Thought Content: Thought content normal.        Judgment: Judgment normal.     Ortho Exam Right great toe exam shows mild to moderate swelling without evidence of infection.  There is a subcutaneous hematoma.  The nail appears to be intact and viable. Specialty Comments:  No specialty comments available.  Imaging: Xr Toe Great Right  Result Date: 06/03/2018 Stable nondisplaced distal phalanx fracture    PMFS History: Patient Active Problem List   Diagnosis Date Noted  . Plantar fasciitis of left foot 01/29/2018  . Closed nondisplaced fracture of fifth left metatarsal bone 10/08/2016  . Controlled type 2 diabetes mellitus without complication, without long-term current use of insulin ([place]) 07/21/2015  . Hyperlipidemia LDL goal <70 07/21/2015  . Essential hypertension, benign 07/21/2015  . Osteopenia 10/25/2013  . Muscle pain 07/24/2012   Past Medical History:  Diagnosis Date  . Acute bronchitis   .  Disorder of bone and cartilage, unspecified   . Elevated blood pressure reading without diagnosis of hypertension   . Myalgia and myositis, unspecified   . Osteoporosis, unspecified   . Other abnormal blood chemistry   . Other and unspecified hyperlipidemia   . Plantar fascial fibromatosis   . Type II or unspecified type diabetes mellitus without mention of complication, not stated as uncontrolled     History reviewed. No pertinent family history.  Past Surgical History:  Procedure Laterality Date  . BREAST SURGERY  1997   reduction  . EYE SURGERY     Left eye contracts removed   Social History   Occupational History  . Not on file  Tobacco Use  . Smoking status: Never  Smoker  . Smokeless tobacco: Never Used  Substance and Sexual Activity  . Alcohol use: Yes    Comment: glass wine every third day  . Drug use: No  . Sexual activity: Not Currently

## 2018-06-26 ENCOUNTER — Ambulatory Visit (INDEPENDENT_AMBULATORY_CARE_PROVIDER_SITE_OTHER): Payer: PPO

## 2018-06-26 ENCOUNTER — Encounter (INDEPENDENT_AMBULATORY_CARE_PROVIDER_SITE_OTHER): Payer: Self-pay | Admitting: Physician Assistant

## 2018-06-26 ENCOUNTER — Ambulatory Visit (INDEPENDENT_AMBULATORY_CARE_PROVIDER_SITE_OTHER): Payer: PPO | Admitting: Orthopaedic Surgery

## 2018-06-26 ENCOUNTER — Other Ambulatory Visit: Payer: Self-pay

## 2018-06-26 ENCOUNTER — Other Ambulatory Visit (INDEPENDENT_AMBULATORY_CARE_PROVIDER_SITE_OTHER): Payer: Self-pay | Admitting: Radiology

## 2018-06-26 DIAGNOSIS — M79674 Pain in right toe(s): Secondary | ICD-10-CM

## 2018-06-26 NOTE — Progress Notes (Signed)
   Office Visit Note   Patient: Jasmine James           Date of Birth: July 27, 1945           MRN: [id number] Visit Date: 06/26/2018              Requested by: [name], NP [place], [place] [address] PCP: [name], NP   Assessment & Plan: Visit Diagnoses:  1. Great toe pain, right     Plan: Impression is healed right great toe fracture.  Patient will continue to advance with activity as tolerated.  Follow-up with [place] as needed.  Follow-Up Instructions: Return if symptoms worsen or fail to improve.   Orders:  Orders Placed This Encounter  Procedures  . XR Toe Great Right   No orders of the defined types were placed in this encounter.     Procedures: No procedures performed   Clinical Data: No additional findings.   Subjective: Chief Complaint  Patient presents with  . Right Great Toe - Pain, Follow-up    HPI patient is a pleasant 73 year old female who presents our clinic today 6 weeks status post right great toe fracture.  She has been doing excellent.  She has been walking for exercise in a regular shoe without any issues.  No pain.  Review of Systems as detailed HPI.  All others reviewed and are negative.   Objective: Vital Signs: There were no vitals taken for this visit.  Physical Exam well-developed and well-nourished female in no acute distress.  Alert and oriented x3.  Ortho Exam examination of the right great toe reveals no swelling.  No pain.  Full range of motion.  She is neurovascular intact distally.  Specialty Comments:  No specialty comments available.  Imaging: Xr Toe Great Right  Result Date: 06/26/2018 Healed great toe fracture    PMFS History: Patient Active Problem List   Diagnosis Date Noted  . Great toe pain, right 06/26/2018  . Plantar fasciitis of left foot 01/29/2018  . Closed nondisplaced fracture of fifth left metatarsal bone 10/08/2016  . Controlled type 2 diabetes mellitus without  complication, without long-term current use of insulin ([place]) 07/21/2015  . Hyperlipidemia LDL goal <70 07/21/2015  . Essential hypertension, benign 07/21/2015  . Osteopenia 10/25/2013  . Muscle pain 07/24/2012   Past Medical History:  Diagnosis Date  . Acute bronchitis   . Disorder of bone and cartilage, unspecified   . Elevated blood pressure reading without diagnosis of hypertension   . Myalgia and myositis, unspecified   . Osteoporosis, unspecified   . Other abnormal blood chemistry   . Other and unspecified hyperlipidemia   . Plantar fascial fibromatosis   . Type II or unspecified type diabetes mellitus without mention of complication, not stated as uncontrolled     History reviewed. No pertinent family history.  Past Surgical History:  Procedure Laterality Date  . BREAST SURGERY  1997   reduction  . EYE SURGERY     Left eye contracts removed   Social History   Occupational History  . Not on file  Tobacco Use  . Smoking status: Never Smoker  . Smokeless tobacco: Never Used  Substance and Sexual Activity  . Alcohol use: Yes    Comment: glass wine every third day  . Drug use: No  . Sexual activity: Not Currently

## 2018-07-30 ENCOUNTER — Other Ambulatory Visit: Payer: Self-pay

## 2018-07-30 ENCOUNTER — Encounter (INDEPENDENT_AMBULATORY_CARE_PROVIDER_SITE_OTHER): Payer: Self-pay | Admitting: Orthopaedic Surgery

## 2018-07-30 ENCOUNTER — Ambulatory Visit (INDEPENDENT_AMBULATORY_CARE_PROVIDER_SITE_OTHER): Payer: PPO | Admitting: Orthopaedic Surgery

## 2018-07-30 ENCOUNTER — Ambulatory Visit (INDEPENDENT_AMBULATORY_CARE_PROVIDER_SITE_OTHER): Payer: PPO

## 2018-07-30 DIAGNOSIS — M25562 Pain in left knee: Secondary | ICD-10-CM

## 2018-07-30 DIAGNOSIS — Z20828 Contact with and (suspected) exposure to other viral communicable diseases: Secondary | ICD-10-CM | POA: Diagnosis not present

## 2018-07-30 DIAGNOSIS — Z01812 Encounter for preprocedural laboratory examination: Secondary | ICD-10-CM | POA: Diagnosis not present

## 2018-07-30 MED ORDER — LIDOCAINE HCL 1 % IJ SOLN
2.0000 mL | INTRAMUSCULAR | Status: AC | PRN
  Administered 2018-07-30: 2 mL

## 2018-07-30 MED ORDER — METHYLPREDNISOLONE ACETATE 40 MG/ML IJ SUSP
40.0000 mg | INTRAMUSCULAR | Status: AC | PRN
  Administered 2018-07-30: 40 mg via INTRA_ARTICULAR

## 2018-07-30 MED ORDER — BUPIVACAINE HCL 0.5 % IJ SOLN
2.0000 mL | INTRAMUSCULAR | Status: AC | PRN
  Administered 2018-07-30: 2 mL via INTRA_ARTICULAR

## 2018-07-30 NOTE — Progress Notes (Signed)
Office Visit Note   Patient: Jasmine James           Date of Birth: Jun 29, 1945           MRN: [id number] Visit Date: 07/30/2018              Requested by: [name], NP [place], [place] [address] PCP: [name], NP   Assessment & Plan: Visit Diagnoses:  1. Acute pain of left knee     Plan: Impression is left knee osteoarthritis exacerbation versus degenerative meniscus tear possibly the root.  Together we agreed to proceed with a cortisone injection first in hopes of giving her relief.  She tolerated the injection well today.  We will see her back as needed.  She is to take it easy for the next 4 to 6 weeks.  Follow-Up Instructions: Return if symptoms worsen or fail to improve.   Orders:  Orders Placed This Encounter  Procedures  . XR KNEE 3 VIEW LEFT   No orders of the defined types were placed in this encounter.     Procedures: Large Joint Inj: L knee on 07/30/2018 9:26 AM Details: 22 G needle Medications: 2 mL bupivacaine 0.5 %; 2 mL lidocaine 1 %; 40 mg methylPREDNISolone acetate 40 MG/ML Outcome: tolerated well, no immediate complications Patient was prepped and draped in the usual sterile fashion.       Clinical Data: No additional findings.   Subjective: Chief Complaint  Patient presents with  . Left Knee - Pain    Jasmine James is a very pleasant 73 year old female who I have been seeing for the last couple years who comes in with acute onset of left knee pain for the last week.  No definite injury.  She states that the left knee feels like it wants to give out but she denies any catching or locking or sensation of a loose body.  She has been doing more walking recently due to the coronavirus epidemic.  She denies any constitutional symptoms.  Denies any swelling.   Review of Systems  Constitutional: Negative.   HENT: Negative.   Eyes: Negative.   Respiratory: Negative.   Cardiovascular: Negative.   Endocrine: Negative.    Musculoskeletal: Negative.   Neurological: Negative.   Hematological: Negative.   Psychiatric/Behavioral: Negative.   All other systems reviewed and are negative.    Objective: Vital Signs: There were no vitals taken for this visit.  Physical Exam Vitals signs and nursing note reviewed.  Constitutional:      Appearance: She is well-developed.  Pulmonary:     Effort: Pulmonary effort is normal.  Skin:    General: Skin is warm.     Capillary Refill: Capillary refill takes less than 2 seconds.  Neurological:     Mental Status: She is alert and oriented to person, place, and time.  Psychiatric:        Behavior: Behavior normal.        Thought Content: Thought content normal.        Judgment: Judgment normal.     Ortho Exam Left knee exam shows no joint effusion.  No joint tenderness.  Collaterals and cruciates are stable.  She has increased pain with terminal flexion in the popliteal fossa.  I cannot palpate a definite cyst.  Her calf is nontender.  Extremities neurovascular intact.  Compartments are soft. Specialty Comments:  No specialty comments available.  Imaging: Xr Knee 3 View Left  Result Date: 07/30/2018  Mild osteoarthritis appropriate for age.    PMFS History: Patient Active Problem List   Diagnosis Date Noted  . Great toe pain, right 06/26/2018  . Plantar fasciitis of left foot 01/29/2018  . Closed nondisplaced fracture of fifth left metatarsal bone 10/08/2016  . Controlled type 2 diabetes mellitus without complication, without long-term current use of insulin ([place]) 07/21/2015  . Hyperlipidemia LDL goal <70 07/21/2015  . Essential hypertension, benign 07/21/2015  . Osteopenia 10/25/2013  . Muscle pain 07/24/2012   Past Medical History:  Diagnosis Date  . Acute bronchitis   . Disorder of bone and cartilage, unspecified   . Elevated blood pressure reading without diagnosis of hypertension   . Myalgia and myositis, unspecified   . Osteoporosis,  unspecified   . Other abnormal blood chemistry   . Other and unspecified hyperlipidemia   . Plantar fascial fibromatosis   . Type II or unspecified type diabetes mellitus without mention of complication, not stated as uncontrolled     History reviewed. No pertinent family history.  Past Surgical History:  Procedure Laterality Date  . BREAST SURGERY  1997   reduction  . EYE SURGERY     Left eye contracts removed   Social History   Occupational History  . Not on file  Tobacco Use  . Smoking status: Never Smoker  . Smokeless tobacco: Never Used  Substance and Sexual Activity  . Alcohol use: Yes    Comment: glass wine every third day  . Drug use: No  . Sexual activity: Not Currently

## 2018-08-15 ENCOUNTER — Other Ambulatory Visit: Payer: Self-pay | Admitting: Nurse Practitioner

## 2018-08-28 ENCOUNTER — Other Ambulatory Visit: Payer: Self-pay

## 2018-08-28 DIAGNOSIS — E785 Hyperlipidemia, unspecified: Secondary | ICD-10-CM

## 2018-08-28 DIAGNOSIS — E119 Type 2 diabetes mellitus without complications: Secondary | ICD-10-CM

## 2018-08-28 DIAGNOSIS — I10 Essential (primary) hypertension: Secondary | ICD-10-CM

## 2018-08-29 ENCOUNTER — Other Ambulatory Visit: Payer: PPO

## 2018-08-29 ENCOUNTER — Other Ambulatory Visit: Payer: Self-pay

## 2018-08-29 DIAGNOSIS — I10 Essential (primary) hypertension: Secondary | ICD-10-CM

## 2018-08-29 DIAGNOSIS — E785 Hyperlipidemia, unspecified: Secondary | ICD-10-CM

## 2018-08-30 LAB — COMPLETE METABOLIC PANEL WITH GFR
AG Ratio: 1.7 (calc) (ref 1.0–2.5)
ALT: 37 U/L — ABNORMAL HIGH (ref 6–29)
AST: 32 U/L (ref 10–35)
Albumin: 4.7 g/dL (ref 3.6–5.1)
Alkaline phosphatase (APISO): 79 U/L (ref 37–153)
BUN: 15 mg/dL (ref 7–25)
CO2: 28 mmol/L (ref 20–32)
Calcium: 9.9 mg/dL (ref 8.6–10.4)
Chloride: 104 mmol/L (ref 98–110)
Creat: 0.75 mg/dL (ref 0.60–0.93)
GFR, Est African American: 92 mL/min/{1.73_m2} (ref >=60)
GFR, Est Non African American: 80 mL/min/{1.73_m2} (ref >=60)
Globulin: 2.8 g/dL (calc) (ref 1.9–3.7)
Glucose, Bld: 108 mg/dL — ABNORMAL HIGH (ref 65–99)
Potassium: 4.3 mmol/L (ref 3.5–5.3)
Sodium: 139 mmol/L (ref 135–146)
Total Bilirubin: 0.7 mg/dL (ref 0.2–1.2)
Total Protein: 7.5 g/dL (ref 6.1–8.1)

## 2018-08-30 LAB — CBC WITH DIFFERENTIAL/PLATELET
Absolute Monocytes: 576 cells/uL (ref 200–950)
Basophils Absolute: 40 cells/uL (ref 0–200)
Basophils Relative: 0.7 %
Eosinophils Absolute: 91 cells/uL (ref 15–500)
Eosinophils Relative: 1.6 %
HCT: 40.3 % (ref 35.0–45.0)
Hemoglobin: 13.6 g/dL (ref 11.7–15.5)
Lymphs Abs: 2143 cells/uL (ref 850–3900)
MCH: 29.1 pg (ref 27.0–33.0)
MCHC: 33.7 g/dL (ref 32.0–36.0)
MCV: 86.1 fL (ref 80.0–100.0)
MPV: 9.7 fL (ref 7.5–12.5)
Monocytes Relative: 10.1 %
Neutro Abs: 2850 cells/uL (ref 1500–7800)
Neutrophils Relative %: 50 %
Platelets: 271 10*3/uL (ref 140–400)
RBC: 4.68 10*6/uL (ref 3.80–5.10)
RDW: 13.5 % (ref 11.0–15.0)
Total Lymphocyte: 37.6 %
WBC: 5.7 10*3/uL (ref 3.8–10.8)

## 2018-08-30 LAB — HEMOGLOBIN A1C
Hgb A1c MFr Bld: 5.9 % of total Hgb — ABNORMAL HIGH (ref <5.7)
Mean Plasma Glucose: 123 (calc)
eAG (mmol/L): 6.8 (calc)

## 2018-08-30 LAB — LIPID PANEL
Cholesterol: 139 mg/dL (ref <200)
HDL: 44 mg/dL — ABNORMAL LOW (ref >=50)
LDL Cholesterol (Calc): 78 mg/dL (calc)
Non-HDL Cholesterol (Calc): 95 mg/dL (calc) (ref <130)
Total CHOL/HDL Ratio: 3.2 (calc) (ref <5.0)
Triglycerides: 90 mg/dL (ref <150)

## 2018-09-05 ENCOUNTER — Encounter: Payer: Self-pay | Admitting: Family

## 2018-09-05 ENCOUNTER — Other Ambulatory Visit: Payer: Self-pay

## 2018-09-05 ENCOUNTER — Ambulatory Visit: Payer: PPO | Admitting: Nurse Practitioner

## 2018-09-05 ENCOUNTER — Ambulatory Visit (INDEPENDENT_AMBULATORY_CARE_PROVIDER_SITE_OTHER): Payer: PPO | Admitting: Family

## 2018-09-05 VITALS — BP 150/84 | HR 83 | Temp 98.3°F | Ht 63.0 in | Wt 148.4 lb

## 2018-09-05 DIAGNOSIS — E119 Type 2 diabetes mellitus without complications: Secondary | ICD-10-CM

## 2018-09-05 DIAGNOSIS — E785 Hyperlipidemia, unspecified: Secondary | ICD-10-CM | POA: Diagnosis not present

## 2018-09-05 DIAGNOSIS — I10 Essential (primary) hypertension: Secondary | ICD-10-CM

## 2018-09-05 NOTE — Progress Notes (Signed)
Provider: Dinah Ngetich FNP-C   [name], NP  Patient Care Team: [name], NP as PCP - General (Nurse Practitioner)  Extended Emergency Contact Information Primary Emergency Contact: Aura Fey Address: [address]          [place]  [place] Home Phone: [phone] Relation: Spouse Secondary Emergency Contact: Curlene Labrum Address: [address]          [place], [place] [address] Johnnette Litter of [place] Phone: [phone] Relation: Son   Goals of care: Advanced Directive information Advanced Directives 09/05/2018  Does Patient Have a Medical Advance Directive? Yes  Type of Advance Directive Living will  Does patient want to make changes to medical advance directive? No - Patient declined  Copy of [place] in Chart? -  Would patient like information on creating a medical advance directive? -     Chief Complaint  Patient presents with  . Medical Management of Chronic Issues    6 month follow up   . Quality Metric Gaps    foot exam, eye exam, microalbumin     HPI:  Jasmine James is a 73 y.o. female seen today  for medical management of chronic diseases. She denies any acute issues during visit.she states coping well with current COVID-19 stay home restrictions.she has not been able to walk with her exercise group friends due to COVID-19 restrictions but walks on her yard.she has lost 2.6 lbs over six months.Patient pleased that she has lost weight.she would like to loss more weight by exercising.No exposure to sick person.Recent lab results 09/05/2018 reviewed with patient and copy of labs given.    Hypertension - Her b/p 150/84 this visit but states tends to run high when coming to visit.she recalls her blood pressure readings at home run's in the 120's/80's.No log brought to visit.she denies any headache,dizziness or changes in vision.   Type 2 DM- currently on metformin 500 mg tablet daily.No CBG log for review.Has up coming appointment  with ophthalmology 09/2018.she is also due for annual foot exam.denies any signs of neuropathy,hypo/hyperglycemia.due for urine micro albumin.    Hyperlipidemia - recent LDL 78 (09/05/2018) on simvastatin 20 mg tablet daily.she states tries to eat heart healthy diet.Not walking as usual due to COVID-19 but gets out on her yard.  Past Medical History:  Diagnosis Date  . Acute bronchitis   . Disorder of bone and cartilage, unspecified   . Elevated blood pressure reading without diagnosis of hypertension   . Myalgia and myositis, unspecified   . Osteoporosis, unspecified   . Other abnormal blood chemistry   . Other and unspecified hyperlipidemia   . Plantar fascial fibromatosis   . Type II or unspecified type diabetes mellitus without mention of complication, not stated as uncontrolled    Past Surgical History:  Procedure Laterality Date  . BREAST SURGERY  1997   reduction  . EYE SURGERY     Left eye contracts removed    Allergies  Allergen Reactions  . Codeine   . Sulfa Antibiotics     Allergies as of 09/05/2018      Reactions   Codeine    Sulfa Antibiotics       Medication List       Accurate as of Sep 05, 2018 10:05 AM. If you have any questions, ask your nurse or doctor.        STOP taking these medications   cephALEXin 250 MG capsule Commonly known as:  KEFLEX Stopped by:  Webb Silversmith  C Ngetich, NP     TAKE these medications   cholecalciferol 1000 units tablet Commonly known as:  VITAMIN D Take 5,000 Units by mouth daily.   metFORMIN 500 MG tablet Commonly known as:  GLUCOPHAGE TAKE 1 TABLET BY MOUTH ONCE DAILY WITH BREAKFAST TO CONTROL SUGAR   simvastatin 20 MG tablet Commonly known as:  ZOCOR TAKE 1 TABLET BY MOUTH EVERY EVENING TO LOWER CHOLESTROL   Tylenol 8 Hour Arthritis Pain 650 MG CR tablet Generic drug:  acetaminophen Take 1,300 mg by mouth daily as needed for pain.      Review of Systems  Constitutional: Negative for appetite change, chills,  fatigue and fever.  HENT: Negative for congestion, rhinorrhea, sinus pressure, sinus pain, sneezing, sore throat and trouble swallowing.   Eyes: Positive for visual disturbance. Negative for redness and itching.       Wears eye glasses   Respiratory: Negative for cough, chest tightness, shortness of breath and wheezing.   Cardiovascular: Negative for chest pain, palpitations and leg swelling.  Gastrointestinal: Negative for abdominal distention, abdominal pain, constipation, diarrhea, nausea and vomiting.  Endocrine: Negative for cold intolerance, heat intolerance, polydipsia, polyphagia and polyuria.  Genitourinary: Negative for difficulty urinating, dysuria, flank pain, frequency and urgency.  Musculoskeletal: Positive for arthralgias. Negative for gait problem.  Skin: Negative for color change, pallor and rash.  Neurological: Negative for dizziness, light-headedness and headaches.  Psychiatric/Behavioral: Negative for agitation, confusion and sleep disturbance. The patient is not nervous/anxious.     Immunization History  Administered Date(s) Administered  . Influenza, Quadrivalent, Recombinant, Inj, Pf 02/13/2018  . Influenza-Unspecified 02/14/2015  . Pneumococcal Conjugate-13 10/22/2013  . Pneumococcal Polysaccharide-23 08/16/2016  . Td 04/24/2009  . Tdap 10/07/2016  . Zoster Recombinat (Shingrix) 10/19/2017, 01/17/2018   Pertinent  Health Maintenance Due  Topic Date Due  . FOOT EXAM  03/14/2018  . OPHTHALMOLOGY EXAM  07/16/2018  . URINE MICROALBUMIN  09/14/2018  . INFLUENZA VACCINE  11/15/2018  . MAMMOGRAM  12/07/2018  . HEMOGLOBIN A1C  03/01/2019  . COLONOSCOPY  06/29/2026  . DEXA SCAN  Completed  . PNA vac Low Risk Adult  Completed   Fall Risk  09/05/2018 03/07/2018 09/13/2017 09/13/2017 09/13/2017  Falls in the past year? 0 0 Yes No No  Number falls in past yr: 0 - - - -  Injury with Fall? 0 - No - -    Vitals:   09/05/18 0933  BP: (!) 150/84  Pulse: 83  Temp: 98.3  F (36.8 C)  TempSrc: Oral  SpO2: 98%  Weight: 148 lb 6.4 oz (67.3 kg)  Height: 5\' 3"  (1.6 m)   Body mass index is 26.29 kg/m. Physical Exam Vitals signs reviewed.  Constitutional:      General: She is not in acute distress.    Appearance: She is overweight. She is not ill-appearing.  HENT:     Head: Normocephalic.     Right Ear: Tympanic membrane, ear canal and external ear normal. There is no impacted cerumen.     Left Ear: Tympanic membrane, ear canal and external ear normal. There is no impacted cerumen.     Nose: Nose normal. No congestion or rhinorrhea.     Mouth/Throat:     Mouth: Mucous membranes are moist.     Pharynx: Oropharynx is clear. No oropharyngeal exudate or posterior oropharyngeal erythema.  Eyes:     General: No scleral icterus.       Right eye: No discharge.  Left eye: No discharge.     Extraocular Movements: Extraocular movements intact.     Conjunctiva/sclera: Conjunctivae normal.     Pupils: Pupils are equal, round, and reactive to light.  Neck:     Musculoskeletal: Normal range of motion. No neck rigidity or muscular tenderness.     Vascular: No carotid bruit.  Cardiovascular:     Rate and Rhythm: Normal rate and regular rhythm.     Pulses: Normal pulses.     Heart sounds: Normal heart sounds. No murmur. No friction rub. No gallop.   Pulmonary:     Effort: Pulmonary effort is normal. No respiratory distress.     Breath sounds: Normal breath sounds. No wheezing, rhonchi or rales.  Chest:     Chest wall: No tenderness.  Abdominal:     General: Bowel sounds are normal. There is no distension.     Palpations: Abdomen is soft. There is no mass.     Tenderness: There is no abdominal tenderness. There is no right CVA tenderness, left CVA tenderness, guarding or rebound.  Musculoskeletal: Normal range of motion.        General: No swelling or tenderness.     Right lower leg: No edema.     Left lower leg: No edema.  Lymphadenopathy:      Cervical: No cervical adenopathy.  Skin:    General: Skin is warm and dry.     Coloration: Skin is not pale.     Findings: No bruising, erythema or rash.  Neurological:     Mental Status: She is alert and oriented to person, place, and time.     Cranial Nerves: No cranial nerve deficit.     Sensory: No sensory deficit.     Motor: No weakness.     Coordination: Coordination normal.     Gait: Gait normal.  Psychiatric:        Mood and Affect: Mood normal.        Behavior: Behavior normal.        Thought Content: Thought content normal.        Judgment: Judgment normal.     Labs reviewed: Recent Labs    09/26/17 0912 03/07/18 1055 08/29/18 0803  NA 139 139 139  K 4.2 4.0 4.3  CL 104 104 104  CO2 27 28 28   GLUCOSE 102 99 108*  BUN 19 17 15   CREATININE 0.78 0.79 0.75  CALCIUM 10.0 10.0 9.9   Recent Labs    09/26/17 0912 03/07/18 1055 08/29/18 0803  AST 32 36* 32  ALT 34* 33* 37*  BILITOT 0.4 0.4 0.7  PROT 7.3 7.6 7.5   Recent Labs    08/29/18 0803  WBC 5.7  NEUTROABS 2,850  HGB 13.6  HCT 40.3  MCV 86.1  PLT 271   Lab Results  Component Value Date   TSH 1.67 10/28/2014   Lab Results  Component Value Date   HGBA1C 5.9 (H) 08/29/2018   Lab Results  Component Value Date   CHOL 139 08/29/2018   HDL 44 (L) 08/29/2018   LDLCALC 78 08/29/2018   TRIG 90 08/29/2018   CHOLHDL 3.2 08/29/2018    Significant Diagnostic Results in last 30 days:  No results found.  Assessment/Plan 1. Essential hypertension, benign B/p 150/84 not at goal for Type 2 DM though recalls B/p readings in the 120's/80's at home.Encouraged to check B/p at home and record.Notify provider if SBP > 150.currently not on any antihypertensive.Recent CR 0.75 (08/29/2018). discussed with  patient to take OTC ASA 81 one by mouth daily for cardiovascular event prophylaxis.continue on simvastatin 20 mg tablet daily. - CBc/diff,CMP future   2. Controlled type 2 diabetes mellitus without  complication, without long-term current use of insulin (HCC) Lab Results  Component Value Date   HGBA1C 5.9 (H) 08/29/2018   No CBG log for reviewed.encouraged to check CBG and record.Recent serum glucose 108 continue on metformin 500 mg tablet daily. - low concentrated sweets diet and exercise at least x 3 per week discussed with patient. - continue on Statin LDL not at goal. - Take baby ASA 81 mg tablet daily  - urine specimen for microalbumin/creatine ratio - Has upcoming appointment for annual eye exam with Ophthalmology - Refer to ambulatory podiatry for annual foot exam. -  Hgb A1C future   3. Hyperlipidemia LDL goal <70 LDL reviewed  78 not at goal.continue on simvastatin 20 mg tablet daily. Lipid panel, future   Family/ staff Communication: Reviewed plan of care with patient.  Labs/tests ordered: urine microalbumin ration this visit ,CBc/diff,CMP,Hgb A1C and Lipid panel in 6 months   Dinah C Ngetich, NP

## 2018-09-06 LAB — MICROALBUMIN / CREATININE URINE RATIO
Creatinine, Urine: 74 mg/dL (ref 20–275)
Microalb Creat Ratio: 9 mcg/mg creat (ref <30)
Microalb, Ur: 0.7 mg/dL

## 2018-09-19 ENCOUNTER — Other Ambulatory Visit: Payer: Self-pay

## 2018-09-19 ENCOUNTER — Ambulatory Visit (INDEPENDENT_AMBULATORY_CARE_PROVIDER_SITE_OTHER): Payer: PPO | Admitting: Family

## 2018-09-19 ENCOUNTER — Ambulatory Visit: Payer: Self-pay

## 2018-09-19 ENCOUNTER — Encounter: Payer: Self-pay | Admitting: Family

## 2018-09-19 DIAGNOSIS — Z0000 Encounter for general adult medical examination without abnormal findings: Secondary | ICD-10-CM | POA: Diagnosis not present

## 2018-09-19 NOTE — Patient Instructions (Addendum)
Ms. Jasmine James , Thank you for taking time to come for your Medicare Wellness Visit. I appreciate your ongoing commitment to your health goals. Please review the following plan we discussed and let me know if I can assist you in the future.   Screening recommendations/referrals: Colonoscopy : Up to date  Mammogram: Due 12/07/2018 Bone Density: Up to date  Recommended yearly ophthalmology/optometry visit for glaucoma screening and checkup Recommended yearly dental visit for hygiene and checkup  Vaccinations: Influenza vaccine: Up to date  Pneumococcal vaccine  Up to date  Tdap vaccine  Up to date  Shingles vaccine  Up to date    Advanced directives: yes   Conditions/risks identified: advance age female > 85 yrs ,Hyperlipidemia   Next appointment: 1 year   Preventive Care 20 Years and Older, Female Preventive care refers to lifestyle choices and visits with your health care provider that can promote health and wellness. What does preventive care include?  A yearly physical exam. This is also called an annual well check.  Dental exams once or twice a year.  Routine eye exams. Ask your health care provider how often you should have your eyes checked.  Personal lifestyle choices, including:  Daily care of your teeth and gums.  Regular physical activity.  Eating a healthy diet.  Avoiding tobacco and drug use.  Limiting alcohol use.  Practicing safe sex.  Taking low-dose aspirin every day.  Taking vitamin and mineral supplements as recommended by your health care provider. What happens during an annual well check? The services and screenings done by your health care provider during your annual well check will depend on your age, overall health, lifestyle risk factors, and family history of disease. Counseling  Your health care provider may ask you questions about your:  Alcohol use.  Tobacco use.  Drug use.  Emotional well-being.  Home and relationship well-being.   Sexual activity.  Eating habits.  History of falls.  Memory and ability to understand (cognition).  Work and work Statistician.  Reproductive health. Screening  You may have the following tests or measurements:  Height, weight, and BMI.  Blood pressure.  Lipid and cholesterol levels. These may be checked every 5 years, or more frequently if you are over 39 years old.  Skin check.  Lung cancer screening. You may have this screening every year starting at age 20 if you have a 30-pack-year history of smoking and currently smoke or have quit within the past 15 years.  Fecal occult blood test (FOBT) of the stool. You may have this test every year starting at age 11.  Flexible sigmoidoscopy or colonoscopy. You may have a sigmoidoscopy every 5 years or a colonoscopy every 10 years starting at age 83.  Hepatitis C blood test.  Hepatitis B blood test.  Sexually transmitted disease (STD) testing.  Diabetes screening. This is done by checking your blood sugar (glucose) after you have not eaten for a while (fasting). You may have this done every 1-3 years.  Bone density scan. This is done to screen for osteoporosis. You may have this done starting at age 77.  Mammogram. This may be done every 1-2 years. Talk to your health care provider about how often you should have regular mammograms. Talk with your health care provider about your test results, treatment options, and if necessary, the need for more tests. Vaccines  Your health care provider may recommend certain vaccines, such as:  Influenza vaccine. This is recommended every year.  Tetanus, diphtheria, and  acellular pertussis (Tdap, Td) vaccine. You may need a Td booster every 10 years.  Zoster vaccine. You may need this after age 13.  Pneumococcal 13-valent conjugate (PCV13) vaccine. One dose is recommended after age 71.  Pneumococcal polysaccharide (PPSV23) vaccine. One dose is recommended after age 32. Talk to your  health care provider about which screenings and vaccines you need and how often you need them. This information is not intended to replace advice given to you by your health care provider. Make sure you discuss any questions you have with your health care provider. Document Released: 04/29/2015 Document Revised: 12/21/2015 Document Reviewed: 02/01/2015 Elsevier Interactive Patient Education  2017 [employer] Prevention in the Home Falls can cause injuries. They can happen to people of all ages. There are many things you can do to make your home safe and to help prevent falls. What can I do on the outside of my home?  Regularly fix the edges of walkways and driveways and fix any cracks.  Remove anything that might make you trip as you walk through a door, such as a raised step or threshold.  Trim any bushes or trees on the path to your home.  Use bright outdoor lighting.  Clear any walking paths of anything that might make someone trip, such as rocks or tools.  Regularly check to see if handrails are loose or broken. Make sure that both sides of any steps have handrails.  Any raised decks and porches should have guardrails on the edges.  Have any leaves, snow, or ice cleared regularly.  Use sand or salt on walking paths during winter.  Clean up any spills in your garage right away. This includes oil or grease spills. What can I do in the bathroom?  Use night lights.  Install grab bars by the toilet and in the tub and shower. Do not use towel bars as grab bars.  Use non-skid mats or decals in the tub or shower.  If you need to sit down in the shower, use a plastic, non-slip stool.  Keep the floor dry. Clean up any water that spills on the floor as soon as it happens.  Remove soap buildup in the tub or shower regularly.  Attach bath mats securely with double-sided non-slip rug tape.  Do not have throw rugs and other things on the floor that can make you trip. What  can I do in the bedroom?  Use night lights.  Make sure that you have a light by your bed that is easy to reach.  Do not use any sheets or blankets that are too big for your bed. They should not hang down onto the floor.  Have a firm chair that has side arms. You can use this for support while you get dressed.  Do not have throw rugs and other things on the floor that can make you trip. What can I do in the kitchen?  Clean up any spills right away.  Avoid walking on wet floors.  Keep items that you use a lot in easy-to-reach places.  If you need to reach something above you, use a strong step stool that has a grab bar.  Keep electrical cords out of the way.  Do not use floor polish or wax that makes floors slippery. If you must use wax, use non-skid floor wax.  Do not have throw rugs and other things on the floor that can make you trip. What can I do with my stairs?  Do not leave any items on the stairs.  Make sure that there are handrails on both sides of the stairs and use them. Fix handrails that are broken or loose. Make sure that handrails are as long as the stairways.  Check any carpeting to make sure that it is firmly attached to the stairs. Fix any carpet that is loose or worn.  Avoid having throw rugs at the top or bottom of the stairs. If you do have throw rugs, attach them to the floor with carpet tape.  Make sure that you have a light switch at the top of the stairs and the bottom of the stairs. If you do not have them, ask someone to add them for you. What else can I do to help prevent falls?  Wear shoes that:  Do not have high heels.  Have rubber bottoms.  Are comfortable and fit you well.  Are closed at the toe. Do not wear sandals.  If you use a stepladder:  Make sure that it is fully opened. Do not climb a closed stepladder.  Make sure that both sides of the stepladder are locked into place.  Ask someone to hold it for you, if possible.   Clearly mark and make sure that you can see:  Any grab bars or handrails.  First and last steps.  Where the edge of each step is.  Use tools that help you move around (mobility aids) if they are needed. These include:  Canes.  Walkers.  Scooters.  Crutches.  Turn on the lights when you go into a dark area. Replace any light bulbs as soon as they burn out.  Set up your furniture so you have a clear path. Avoid moving your furniture around.  If any of your floors are uneven, fix them.  If there are any pets around you, be aware of where they are.  Review your medicines with your doctor. Some medicines can make you feel dizzy. This can increase your chance of falling. Ask your doctor what other things that you can do to help prevent falls. This information is not intended to replace advice given to you by your health care provider. Make sure you discuss any questions you have with your health care provider. Document Released: 01/27/2009 Document Revised: 09/08/2015 Document Reviewed: 05/07/2014 Elsevier Interactive Patient Education  2017 [employer].

## 2018-09-19 NOTE — Progress Notes (Signed)
Subjective:   Jasmine James is a 73 y.o. female who presents for Medicare Annual (Subsequent) preventive examination.  Review of Systems:   Cardiac Risk Factors include: advanced age (>84men, >17 women);dyslipidemia     Objective:     Vitals: There were no vitals taken for this visit.  There is no height or weight on file to calculate BMI.  Advanced Directives 09/05/2018 05/20/2018 03/07/2018 09/13/2017 10/07/2016 08/30/2016 08/16/2016  Does Patient Have a Medical Advance Directive? Yes No Yes Yes No Yes Yes  Type of Advance Directive Living will - [place];Living will [place];Living will - [place];Living will [place];Living will  Does patient want to make changes to medical advance directive? No - Patient declined - - Yes (MAU/Ambulatory/Procedural Areas - Information given) - - No - Patient declined  Copy of Healthcare Power of Attorney in Chart? - - No - copy requested No - copy requested - No - copy requested No - copy requested  Would patient like information on creating a medical advance directive? - - - - - - -    Tobacco Social History   Tobacco Use  Smoking Status Never Smoker  Smokeless Tobacco Never Used     Counseling given: Not Answered   Clinical Intake:  Pre-visit preparation completed: No  Pain : No/denies pain     Nutritional Risks: None Diabetes: No  How often do you need to have someone help you when you read instructions, pamphlets, or other written materials from your doctor or pharmacy?: 1 - Never What is the last grade level you completed in school?: 12 grade   Interpreter Needed?: No  Information entered by :: Brandace Cargle FNP-C   Past Medical History:  Diagnosis Date  . Acute bronchitis   . Anxiety   . Disorder of bone and cartilage, unspecified   . Elevated blood pressure reading without diagnosis of hypertension   . Myalgia and myositis, unspecified   .  Osteoporosis, unspecified   . Other abnormal blood chemistry   . Other and unspecified hyperlipidemia   . Plantar fascial fibromatosis   . Type II or unspecified type diabetes mellitus without mention of complication, not stated as uncontrolled    Past Surgical History:  Procedure Laterality Date  . BREAST SURGERY  1997   reduction  . EYE SURGERY     Left eye contracts removed   No family history on file. Social History   Socioeconomic History  . Marital status: Married    Spouse name: Not on file  . Number of children: Not on file  . Years of education: Not on file  . Highest education level: Not on file  Occupational History  . Not on file  Social Needs  . Financial resource strain: Not hard at all  . Food insecurity:    Worry: Never true    Inability: Never true  . Transportation needs:    Medical: No    Non-medical: No  Tobacco Use  . Smoking status: Never Smoker  . Smokeless tobacco: Never Used  Substance and Sexual Activity  . Alcohol use: Yes    Comment: glass wine every third day  . Drug use: No  . Sexual activity: Not Currently  Lifestyle  . Physical activity:    Days per week: 7 days    Minutes per session: 60 min  . Stress: Not at all  Relationships  . Social connections:    Talks on  phone: More than three times a week    Gets together: More than three times a week    Attends religious service: More than 4 times per year    Active member of club or organization: Yes    Attends meetings of clubs or organizations: More than 4 times per year    Relationship status: Married  Other Topics Concern  . Not on file  Social History Narrative  . Not on file    Outpatient Encounter Medications as of 09/19/2018  Medication Sig  . acetaminophen (TYLENOL 8 HOUR ARTHRITIS PAIN) 650 MG CR tablet Take 1,300 mg by mouth daily as needed for pain.  . cholecalciferol (VITAMIN D) 1000 units tablet Take 5,000 Units by mouth daily.   . metFORMIN (GLUCOPHAGE) 500 MG  tablet TAKE 1 TABLET BY MOUTH ONCE DAILY WITH BREAKFAST TO CONTROL SUGAR  . simvastatin (ZOCOR) 20 MG tablet TAKE 1 TABLET BY MOUTH EVERY EVENING TO LOWER CHOLESTROL   No facility-administered encounter medications on file as of 09/19/2018.     Activities of Daily Living In your present state of health, do you have any difficulty performing the following activities: 09/19/2018  Hearing? N  Vision? N  Difficulty concentrating or making decisions? N  Walking or climbing stairs? N  Dressing or bathing? N  Doing errands, shopping? N  Preparing Food and eating ? N  Using the Toilet? N  In the past six months, have you accidently leaked urine? N  Do you have problems with loss of bowel control? N  Managing your Medications? N  Managing your Finances? N  Housekeeping or managing your Housekeeping? N  Some recent data might be hidden    Patient Care Team: [name], NP as PCP - General (Nurse Practitioner)    Assessment:   This is a routine wellness examination for [place].  Exercise Activities and Dietary recommendations Current Exercise Habits: Home exercise routine, Type of exercise: walking, Time (Minutes): 60, Frequency (Times/Week): 7, Weekly Exercise (Minutes/Week): 420, Intensity: Moderate, Exercise limited by: None identified  Goals    . Stress reliever      Starting 08/16/2016 I will focus on myself and stress less.       Fall Risk Fall Risk  09/19/2018 09/05/2018 03/07/2018 09/13/2017 09/13/2017  Falls in the past year? 0 0 0 Yes No  Number falls in past yr: 0 0 - - -  Injury with Fall? 0 0 - No -   Is the patient's home free of loose throw rugs in walkways, pet beds, electrical cords, etc?   no      Grab bars in the bathroom? yes      Handrails on the stairs?   yes      Adequate lighting?   yes   Depression Screen PHQ 2/9 Scores 09/19/2018 09/13/2017 08/16/2016 07/21/2015  PHQ - 2 Score 0 0 0 0     Cognitive Function MMSE - Mini Mental State Exam 09/13/2017 08/16/2016   Orientation to time 4 5  Orientation to Place 4 5  Registration 3 3  Attention/ Calculation 5 5  Recall 2 2  Language- name 2 objects 2 2  Language- repeat 1 1  Language- follow 3 step command 3 3  Language- read & follow direction 1 1  Write a sentence 1 1  Copy design 1 1  Total score 27 29     6CIT Screen 09/19/2018  What Year? 0 points  What month? 0 points  What time? 0  points  Count back from 20 0 points  Months in reverse 0 points  Repeat phrase 0 points  Total Score 0    Immunization History  Administered Date(s) Administered  . Influenza, Quadrivalent, Recombinant, Inj, Pf 02/13/2018  . Influenza-Unspecified 02/14/2015  . Pneumococcal Conjugate-13 10/22/2013  . Pneumococcal Polysaccharide-23 08/16/2016  . Td 04/24/2009  . Tdap 10/07/2016  . Zoster Recombinat (Shingrix) 10/19/2017, 01/17/2018    Qualifies for Shingles Vaccine? Up to date   Screening Tests Health Maintenance  Topic Date Due  . Hepatitis C Screening  [birth year]  . FOOT EXAM  03/14/2018  . OPHTHALMOLOGY EXAM  07/16/2018  . INFLUENZA VACCINE  11/15/2018  . MAMMOGRAM  12/07/2018  . HEMOGLOBIN A1C  03/01/2019  . URINE MICROALBUMIN  09/05/2019  . COLONOSCOPY  06/29/2026  . TETANUS/TDAP  10/08/2026  . DEXA SCAN  Completed  . PNA vac Low Risk Adult  Completed    Cancer Screenings: Lung: Low Dose CT Chest recommended if Age 29-80 years, 30 pack-year currently smoking OR have quit w/in 15years. Patient does not qualify. Breast:  Up to date on Mammogram? Yes   Up to date of Bone Density/Dexa? Yes Colorectal:Up to date   Additional Screenings: Hepatitis C Screening: Low risk      Plan:  - mammogram due up coming appointment in  12/07/2018   I have personally reviewed and noted the following in the patient's chart:   . Medical and social history . Use of alcohol, tobacco or illicit drugs  . Current medications and supplements . Functional ability and status . Nutritional status .  Physical activity . Advanced directives . List of other physicians . Hospitalizations, surgeries, and ER visits in previous 12 months . Vitals . Screenings to include cognitive, depression, and falls . Referrals and appointments  In addition, I have reviewed and discussed with patient certain preventive protocols, quality metrics, and best practice recommendations. A written personalized care plan for preventive services as well as general preventive health recommendations were provided to patient.   Sandrea Hughs, NP  09/19/2018

## 2018-09-19 NOTE — Progress Notes (Signed)
Patient ID: QUINLAN MCFALL, female   DOB: Aug 17, 1945, 73 y.o.   MRN: [id number] This service is provided via telemedicine  No vital signs collected/recorded due to the encounter was a telemedicine visit.   Location of patient (ex: home, work):  HOME  Patient consents to a telephone visit:  YES  Location of the provider (ex: office, home):  OFFICE  Name of any referring provider:  Sherrie Mustache, NP  Names of all persons participating in the telemedicine service and their role in the encounter:  PATIENT, [place], [place]  Time spent on call:  4:00

## 2018-10-10 DIAGNOSIS — H25041 Posterior subcapsular polar age-related cataract, right eye: Secondary | ICD-10-CM | POA: Diagnosis not present

## 2018-10-10 DIAGNOSIS — Z961 Presence of intraocular lens: Secondary | ICD-10-CM | POA: Diagnosis not present

## 2018-10-10 DIAGNOSIS — H25011 Cortical age-related cataract, right eye: Secondary | ICD-10-CM | POA: Diagnosis not present

## 2018-10-10 DIAGNOSIS — H26492 Other secondary cataract, left eye: Secondary | ICD-10-CM | POA: Diagnosis not present

## 2018-10-10 DIAGNOSIS — H2511 Age-related nuclear cataract, right eye: Secondary | ICD-10-CM | POA: Diagnosis not present

## 2018-10-10 DIAGNOSIS — H18413 Arcus senilis, bilateral: Secondary | ICD-10-CM | POA: Diagnosis not present

## 2018-10-16 IMAGING — CR DG FOOT COMPLETE 3+V*L*
3 series · 3 of 3 positions shown · non-contrast
Comparison: None.

CLINICAL DATA: Fall today stepping off curb with lateral pain.

EXAM:
LEFT FOOT - COMPLETE 3+ VIEW

[x foot ap left]
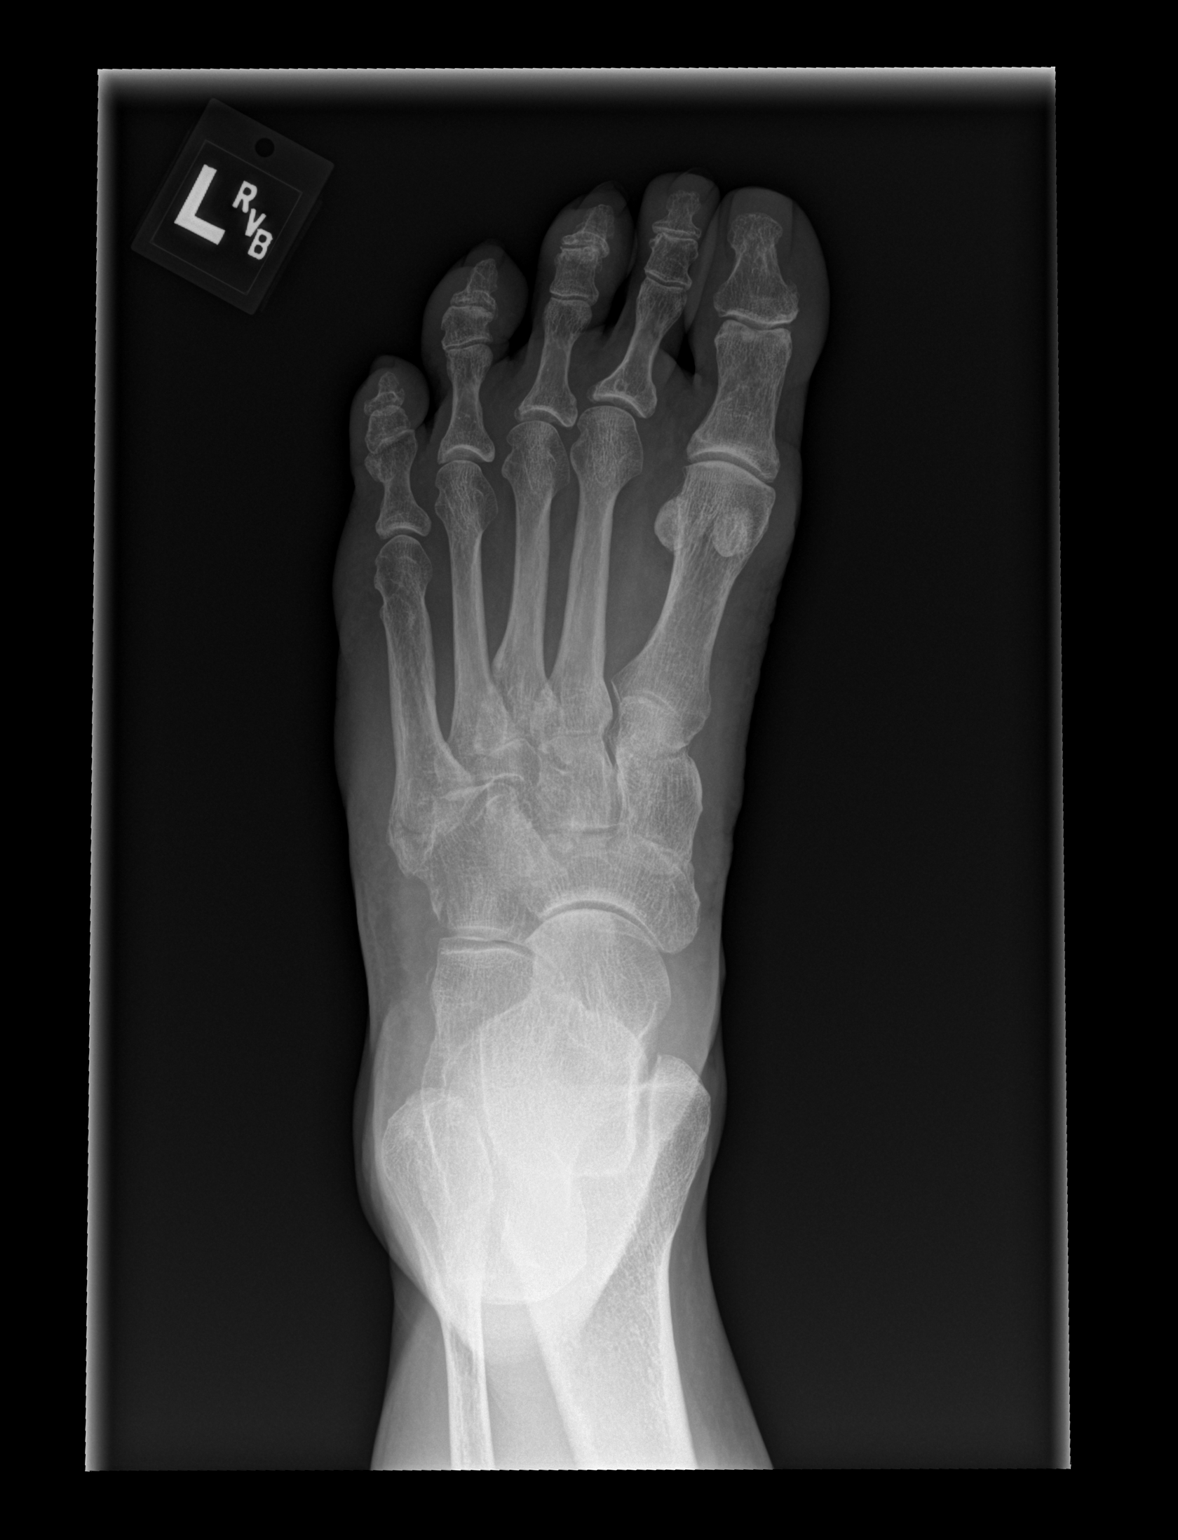

[x foot obl left]
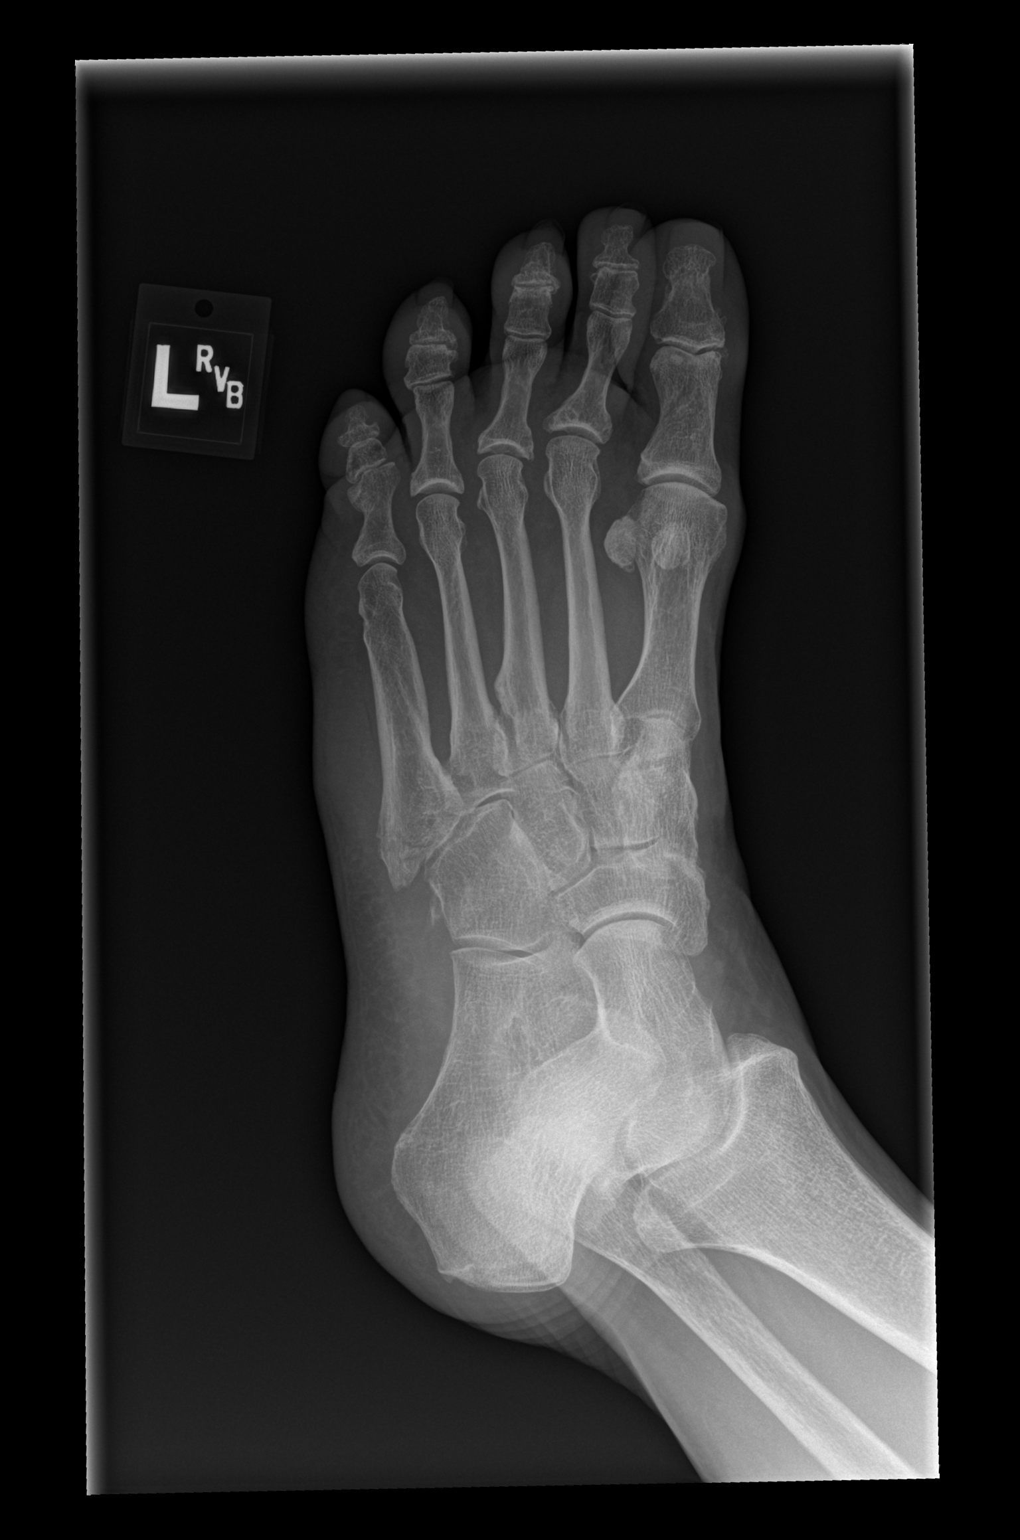

[x foot lat left]
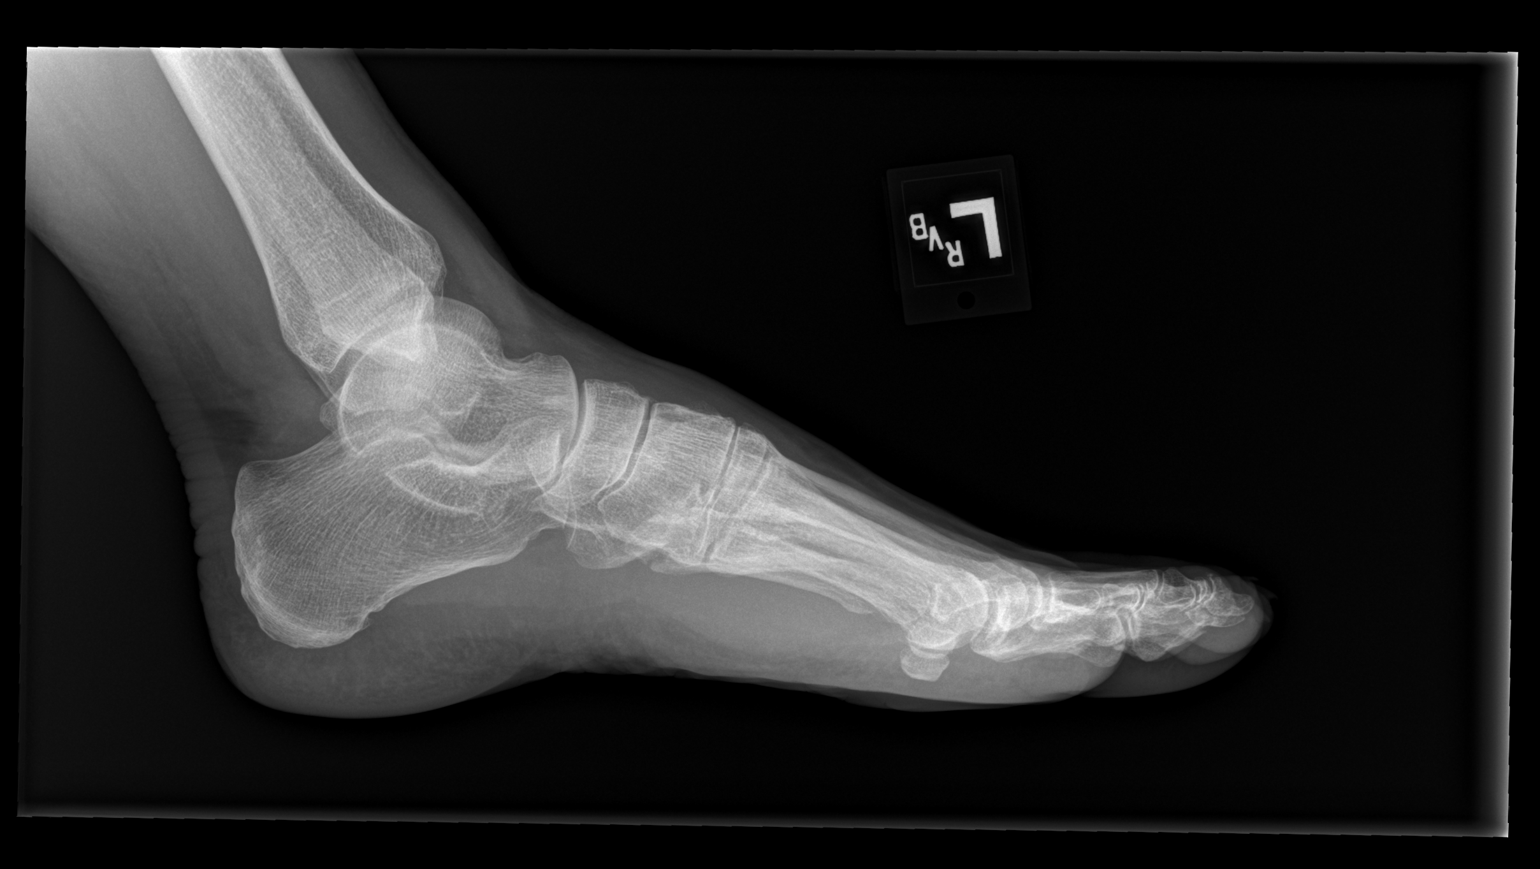

[3 of 3 positions shown; findings below may reference images not displayed]

FINDINGS: Examination demonstrates a very minimally displaced fracture
involving the base of the fifth metatarsal. Minimal degenerative
changes over the first MTP joint and interphalangeal joints as well
as midfoot.
IMPRESSION: Minimally displaced fracture of the base of the fifth metatarsal.

## 2018-10-24 ENCOUNTER — Other Ambulatory Visit: Payer: Self-pay

## 2018-10-24 ENCOUNTER — Ambulatory Visit (INDEPENDENT_AMBULATORY_CARE_PROVIDER_SITE_OTHER): Payer: PPO | Admitting: Podiatry

## 2018-10-24 ENCOUNTER — Encounter: Payer: Self-pay | Admitting: Podiatry

## 2018-10-24 DIAGNOSIS — M2010 Hallux valgus (acquired), unspecified foot: Secondary | ICD-10-CM

## 2018-10-24 DIAGNOSIS — M21619 Bunion of unspecified foot: Secondary | ICD-10-CM

## 2018-10-24 DIAGNOSIS — E119 Type 2 diabetes mellitus without complications: Secondary | ICD-10-CM

## 2018-10-24 DIAGNOSIS — M205X9 Other deformities of toe(s) (acquired), unspecified foot: Secondary | ICD-10-CM | POA: Diagnosis not present

## 2018-10-24 NOTE — Progress Notes (Signed)
Subjective: Jasmine James presents today referred by [name], NP for diabetic foot evaluation.  Patient relates three year history of diabetes.  Patient denies any history of foot wounds.  Patient denies any history of numbness, tingling, burning, pins/needles sensations.  Patient states she walks daily for exercise. Walks 1.5 miles per day and on Sunday, she walks approximately 2 hours.  Past Medical History:  Diagnosis Date  . Acute bronchitis   . Anxiety   . Disorder of bone and cartilage, unspecified   . Elevated blood pressure reading without diagnosis of hypertension   . Myalgia and myositis, unspecified   . Osteoporosis, unspecified   . Other abnormal blood chemistry   . Other and unspecified hyperlipidemia   . Plantar fascial fibromatosis   . Type II or unspecified type diabetes mellitus without mention of complication, not stated as uncontrolled     Patient Active Problem List   Diagnosis Date Noted  . Great toe pain, right 06/26/2018  . Plantar fasciitis of left foot 01/29/2018  . Closed nondisplaced fracture of fifth left metatarsal bone 10/08/2016  . Controlled type 2 diabetes mellitus without complication, without long-term current use of insulin ([place]) 07/21/2015  . Hyperlipidemia LDL goal <70 07/21/2015  . Essential hypertension, benign 07/21/2015  . Osteopenia 10/25/2013  . Muscle pain 07/24/2012    Past Surgical History:  Procedure Laterality Date  . BREAST SURGERY  1997   reduction  . EYE SURGERY     Left eye contracts removed     Current Outpatient Medications:  .  acetaminophen (TYLENOL 8 HOUR ARTHRITIS PAIN) 650 MG CR tablet, Take 1,300 mg by mouth daily as needed for pain., Disp: , Rfl:  .  BESIVANCE 0.6 % SUSP, SHAKE LQ AND INT 1 GTT IN OD TID UTD, Disp: , Rfl:  .  cholecalciferol (VITAMIN D) 1000 units tablet, Take 5,000 Units by mouth daily. , Disp: , Rfl:  .  DUREZOL 0.05 % EMUL, INT 1 GTT IN OD TID, Disp: , Rfl:  .  metFORMIN  (GLUCOPHAGE) 500 MG tablet, TAKE 1 TABLET BY MOUTH ONCE DAILY WITH BREAKFAST TO CONTROL SUGAR, Disp: 90 tablet, Rfl: 1 .  PROLENSA 0.07 % SOLN, , Disp: , Rfl:  .  simvastatin (ZOCOR) 20 MG tablet, TAKE 1 TABLET BY MOUTH EVERY EVENING TO LOWER CHOLESTROL, Disp: 90 tablet, Rfl: 1  Allergies  Allergen Reactions  . Codeine   . Sulfa Antibiotics     Social History   Occupational History  . Not on file  Tobacco Use  . Smoking status: Never Smoker  . Smokeless tobacco: Never Used  Substance and Sexual Activity  . Alcohol use: Yes    Comment: glass wine every third day  . Drug use: No  . Sexual activity: Not Currently    History reviewed. No pertinent family history.  Immunization History  Administered Date(s) Administered  . Influenza, Quadrivalent, Recombinant, Inj, Pf 02/13/2018  . Influenza-Unspecified 02/14/2015  . Pneumococcal Conjugate-13 10/22/2013  . Pneumococcal Polysaccharide-23 08/16/2016  . Td 04/24/2009  . Tdap 10/07/2016  . Zoster Recombinat (Shingrix) 10/19/2017, 01/17/2018    Review of systems: Positive Findings in bold print.  Constitutional:  chills, fatigue, fever, sweats, weight change Communication: Optometrist, sign Ecologist, hand writing, iPad/Android device Head: headaches, head injury Eyes: changes in vision, eye pain, glaucoma, cataracts, macular degeneration, diplopia, glare,  light sensitivity, eyeglasses or contacts, blindness Ears nose mouth throat: hearing impaired, hearing aids,  ringing in ears, deaf, sign language,  vertigo,  nosebleeds,  rhinitis,  cold sores, snoring, swollen glands Cardiovascular: HTN, edema, arrhythmia, pacemaker in place, defibrillator in place, chest pain/tightness, chronic anticoagulation, blood clot, heart failure, MI Peripheral Vascular: leg cramps, varicose veins, blood clots, lymphedema, varicosities Respiratory:  asthma, difficulty breathing, denies congestion, SOB, wheezing, cough,  emphysema Gastrointestinal: change in appetite or weight, abdominal pain, constipation, diarrhea, nausea, vomiting, vomiting blood, change in bowel habits, abdominal pain, jaundice, rectal bleeding, hemorrhoids, GERD Genitourinary:  nocturia,  pain on urination, polyuria,  blood in urine, Foley catheter, urinary urgency, ESRD on hemodialysis Musculoskeletal: amputation, cramping, stiff joints, painful joints, decreased joint motion, fractures, OA, gout, hemiplegia, paraplegia, uses cane, wheelchair bound, uses walker, uses rollator Skin: +changes in toenails, color change, dryness, itching, mole changes,  rash, wound(s) Neurological: headaches, numbness in feet, paresthesias in feet, burning in feet, fainting,  seizures, change in speech, migraines, memory problems/poor historian, cerebral palsy, weakness, paralysis, CVA, TIA Endocrine: diabetes, hypothyroidism, hyperthyroidism,  goiter, dry mouth, flushing, heat intolerance, cold intolerance,  excessive thirst, denies polyuria,  nocturia Hematological:  easy bleeding, excessive bleeding, easy bruising, enlarged lymph nodes, on long term blood thinner, history of past transusions Allergy/immunological:  hives, eczema, frequent infections, multiple drug allergies, seasonal allergies, transplant recipient, multiple food allergies Psychiatric:  anxiety, depression, mood disorder, suicidal ideations, hallucinations, insomnia  Objective: There were no vitals filed for this visit. Vascular Examination: Capillary refill time immediate x 10 digits.  Dorsalis pedis pulses palpable b/l.  Posterior tibial pulses palpable b/l.  Digital hair present x 10 digits.  Skin temperature gradient WNL b/l.  Dermatological Examination: Skin with normal turgor, texture and tone b/l  Toenails 1-5 b/l well manicured.  Musculoskeletal: Muscle strength 5/5 to all LE muscle groups.  Clawtoe deformity b/l 2nd and 3rd digits.  HAV with bunion  b/l.  Neurological: Sensation intact 5/5 b/l with 10 gram monofilament.  Vibratory sensation intact b/l.  Assessment: 1. HAV with bunions b/l 2. Claw toe deformity b/l 2nd, 3rd digits 3. NIDDM  Plan: 1. Discussed diabetic foot care principles. Literature dispensed on today. 2. Patient to continue soft, supportive shoe gear daily. 3. Patient to report any pedal injuries to medical professional immediately. 4. Follow up one year. 5. Patient/POA to call should there be a concern in the interim.

## 2018-10-24 NOTE — Patient Instructions (Addendum)
Diabetes Mellitus and Foot Care Foot care is an important part of your health, especially when you have diabetes. Diabetes may cause you to have problems because of poor blood flow (circulation) to your feet and legs, which can cause your skin to:  Become thinner and drier.  Break more easily.  Heal more slowly.  Peel and crack. You may also have nerve damage (neuropathy) in your legs and feet, causing decreased feeling in them. This means that you may not notice minor injuries to your feet that could lead to more serious problems. Noticing and addressing any potential problems early is the best way to prevent future foot problems. How to care for your feet Foot hygiene  Wash your feet daily with warm water and mild soap. Do not use hot water. Then, pat your feet and the areas between your toes until they are completely dry. Do not soak your feet as this can dry your skin.  Trim your toenails straight across. Do not dig under them or around the cuticle. File the edges of your nails with an emery board or nail file.  Apply a moisturizing lotion or petroleum jelly to the skin on your feet and to dry, brittle toenails. Use lotion that does not contain alcohol and is unscented. Do not apply lotion between your toes. Shoes and socks  Wear clean socks or stockings every day. Make sure they are not too tight. Do not wear knee-high stockings since they may decrease blood flow to your legs.  Wear shoes that fit properly and have enough cushioning. Always look in your shoes before you put them on to be sure there are no objects inside.  To break in new shoes, wear them for just a few hours a day. This prevents injuries on your feet. Wounds, scrapes, corns, and calluses  Check your feet daily for blisters, cuts, bruises, sores, and redness. If you cannot see the bottom of your feet, use a mirror or ask someone for help.  Do not cut corns or calluses or try to remove them with medicine.  If you  find a minor scrape, cut, or break in the skin on your feet, keep it and the skin around it clean and dry. You may clean these areas with mild soap and water. Do not clean the area with peroxide, alcohol, or iodine.  If you have a wound, scrape, corn, or callus on your foot, look at it several times a day to make sure it is healing and not infected. Check for: ? Redness, swelling, or pain. ? Fluid or blood. ? Warmth. ? Pus or a bad smell. General instructions  Do not cross your legs. This may decrease blood flow to your feet.  Do not use heating pads or hot water bottles on your feet. They may burn your skin. If you have lost feeling in your feet or legs, you may not know this is happening until it is too late.  Protect your feet from hot and cold by wearing shoes, such as at the beach or on hot pavement.  Schedule a complete foot exam at least once a year (annually) or more often if you have foot problems. If you have foot problems, report any cuts, sores, or bruises to your health care provider immediately. Contact a health care provider if:  You have a medical condition that increases your risk of infection and you have any cuts, sores, or bruises on your feet.  You have an injury that is not   healing.  You have redness on your legs or feet.  You feel burning or tingling in your legs or feet.  You have pain or cramps in your legs and feet.  Your legs or feet are numb.  Your feet always feel cold.  You have pain around a toenail. Get help right away if:  You have a wound, scrape, corn, or callus on your foot and: ? You have pain, swelling, or redness that gets worse. ? You have fluid or blood coming from the wound, scrape, corn, or callus. ? Your wound, scrape, corn, or callus feels warm to the touch. ? You have pus or a bad smell coming from the wound, scrape, corn, or callus. ? You have a fever. ? You have a red line going up your leg. Summary  Check your feet every day  for cuts, sores, red spots, swelling, and blisters.  Moisturize feet and legs daily.  Wear shoes that fit properly and have enough cushioning.  If you have foot problems, report any cuts, sores, or bruises to your health care provider immediately.  Schedule a complete foot exam at least once a year (annually) or more often if you have foot problems. This information is not intended to replace advice given to you by your health care provider. Make sure you discuss any questions you have with your health care provider. Document Released: 03/30/2000 Document Revised: 05/15/2017 Document Reviewed: 05/04/2016 Elsevier Patient Education  2020 [employer].  1.

## 2018-11-07 ENCOUNTER — Ambulatory Visit (INDEPENDENT_AMBULATORY_CARE_PROVIDER_SITE_OTHER): Payer: PPO | Admitting: Orthopaedic Surgery

## 2018-11-07 ENCOUNTER — Encounter: Payer: Self-pay | Admitting: Physician Assistant

## 2018-11-07 DIAGNOSIS — M1712 Unilateral primary osteoarthritis, left knee: Secondary | ICD-10-CM | POA: Diagnosis not present

## 2018-11-07 MED ORDER — METHYLPREDNISOLONE ACETATE 40 MG/ML IJ SUSP
40.0000 mg | INTRAMUSCULAR | Status: AC | PRN
  Administered 2018-11-07: 40 mg via INTRA_ARTICULAR

## 2018-11-07 MED ORDER — LIDOCAINE HCL 1 % IJ SOLN
2.0000 mL | INTRAMUSCULAR | Status: AC | PRN
  Administered 2018-11-07: 2 mL

## 2018-11-07 MED ORDER — BUPIVACAINE HCL 0.25 % IJ SOLN
2.0000 mL | INTRAMUSCULAR | Status: AC | PRN
  Administered 2018-11-07: 2 mL via INTRA_ARTICULAR

## 2018-11-07 NOTE — Progress Notes (Signed)
Office Visit Note   Patient: Jasmine James           Date of Birth: 1945-04-27           MRN: [id number] Visit Date: 11/07/2018              Requested by: Jasmine Chandler, NP [place],  [place] [address] PCP: Jasmine Chandler, NP   Assessment & Plan: Visit Diagnoses:  1. Unilateral primary osteoarthritis, left knee     Plan: Impression is left knee arthritis flareup.  We will reinject the left knee with cortisone today.  I have provided her with a viscosupplementation handout.  She will follow-up with [place] as needed.  Follow-Up Instructions: Return if symptoms worsen or fail to improve.   Orders:  Orders Placed This Encounter  Procedures  . Large Joint Inj: L knee   No orders of the defined types were placed in this encounter.     Procedures: Large Joint Inj: L knee on 11/07/2018 3:12 PM Indications: pain Details: 22 G needle, anterolateral approach Medications: 2 mL bupivacaine 0.25 %; 2 mL lidocaine 1 %; 40 mg methylPREDNISolone acetate 40 MG/ML      Clinical Data: No additional findings.   Subjective: Chief Complaint  Patient presents with  . Left Knee - Pain    HPI Jasmine James is a pleasant 73 year old female who presents our clinic today with recurrent left knee pain.  There is never been a specific injury but she has been walking quite a bit over the past several months. She is primarily having pain to the posterior medial aspect.  Similar to the pain she was having when she was here a little over 3 months ago.  She has tried over-the-counter medications without significant relief of symptoms.  She would like repeat cortisone injection today if possible.  No previous viscosupplementation injection.  Review of Systems as detailed in HPI.  All others reviewed and are negative.   Objective: Vital Signs: There were no vitals taken for this visit.  Physical Exam well-developed and well-nourished female in no acute distress.  Alert and oriented  x3.  Ortho Exam examination of the left knee shows no effusion.  Range of motion 0 to 125 degrees.  No joint line tenderness.  Mild to moderate patellofemoral crepitus.  Ligaments are stable.  She is neurovascular intact distally.  Specialty Comments:  No specialty comments available.  Imaging: No new imaging   PMFS History: Patient Active Problem List   Diagnosis Date Noted  . Great toe pain, right 06/26/2018  . Plantar fasciitis of left foot 01/29/2018  . Closed nondisplaced fracture of fifth left metatarsal bone 10/08/2016  . Controlled type 2 diabetes mellitus without complication, without long-term current use of insulin ([place]) 07/21/2015  . Hyperlipidemia LDL goal <70 07/21/2015  . Essential hypertension, benign 07/21/2015  . Osteopenia 10/25/2013  . Muscle pain 07/24/2012   Past Medical History:  Diagnosis Date  . Acute bronchitis   . Anxiety   . Disorder of bone and cartilage, unspecified   . Elevated blood pressure reading without diagnosis of hypertension   . Myalgia and myositis, unspecified   . Osteoporosis, unspecified   . Other abnormal blood chemistry   . Other and unspecified hyperlipidemia   . Plantar fascial fibromatosis   . Type II or unspecified type diabetes mellitus without mention of complication, not stated as uncontrolled     History reviewed. No pertinent family history.  Past Surgical History:  Procedure Laterality Date  . BREAST SURGERY  1997   reduction  . EYE SURGERY     Left eye contracts removed   Social History   Occupational History  . Not on file  Tobacco Use  . Smoking status: Never Smoker  . Smokeless tobacco: Never Used  Substance and Sexual Activity  . Alcohol use: Yes    Comment: glass wine every third day  . Drug use: No  . Sexual activity: Not Currently

## 2018-11-14 DIAGNOSIS — H2511 Age-related nuclear cataract, right eye: Secondary | ICD-10-CM | POA: Diagnosis not present

## 2018-11-14 DIAGNOSIS — H25811 Combined forms of age-related cataract, right eye: Secondary | ICD-10-CM | POA: Diagnosis not present

## 2018-11-14 DIAGNOSIS — Z961 Presence of intraocular lens: Secondary | ICD-10-CM | POA: Diagnosis not present

## 2019-02-09 ENCOUNTER — Other Ambulatory Visit: Payer: Self-pay | Admitting: Nurse Practitioner

## 2019-02-27 DIAGNOSIS — Z1231 Encounter for screening mammogram for malignant neoplasm of breast: Secondary | ICD-10-CM | POA: Diagnosis not present

## 2019-02-27 DIAGNOSIS — N958 Other specified menopausal and perimenopausal disorders: Secondary | ICD-10-CM | POA: Diagnosis not present

## 2019-02-27 DIAGNOSIS — Z6827 Body mass index (BMI) 27.0-27.9, adult: Secondary | ICD-10-CM | POA: Diagnosis not present

## 2019-02-27 DIAGNOSIS — M8588 Other specified disorders of bone density and structure, other site: Secondary | ICD-10-CM | POA: Diagnosis not present

## 2019-02-27 DIAGNOSIS — M810 Age-related osteoporosis without current pathological fracture: Secondary | ICD-10-CM | POA: Insufficient documentation

## 2019-02-27 DIAGNOSIS — Z01419 Encounter for gynecological examination (general) (routine) without abnormal findings: Secondary | ICD-10-CM | POA: Diagnosis not present

## 2019-02-27 DIAGNOSIS — E119 Type 2 diabetes mellitus without complications: Secondary | ICD-10-CM | POA: Insufficient documentation

## 2019-03-06 ENCOUNTER — Other Ambulatory Visit: Payer: PPO

## 2019-03-06 ENCOUNTER — Other Ambulatory Visit: Payer: Self-pay

## 2019-03-06 DIAGNOSIS — E119 Type 2 diabetes mellitus without complications: Secondary | ICD-10-CM | POA: Diagnosis not present

## 2019-03-06 DIAGNOSIS — E785 Hyperlipidemia, unspecified: Secondary | ICD-10-CM | POA: Diagnosis not present

## 2019-03-06 DIAGNOSIS — I10 Essential (primary) hypertension: Secondary | ICD-10-CM | POA: Diagnosis not present

## 2019-03-07 LAB — CBC WITH DIFFERENTIAL/PLATELET
Absolute Monocytes: 594 cells/uL (ref 200–950)
Basophils Absolute: 50 cells/uL (ref 0–200)
Basophils Relative: 0.9 %
Eosinophils Absolute: 73 cells/uL (ref 15–500)
Eosinophils Relative: 1.3 %
HCT: 40.4 % (ref 35.0–45.0)
Hemoglobin: 13.4 g/dL (ref 11.7–15.5)
Lymphs Abs: 2279 cells/uL (ref 850–3900)
MCH: 29.5 pg (ref 27.0–33.0)
MCHC: 33.2 g/dL (ref 32.0–36.0)
MCV: 89 fL (ref 80.0–100.0)
MPV: 9.6 fL (ref 7.5–12.5)
Monocytes Relative: 10.6 %
Neutro Abs: 2604 cells/uL (ref 1500–7800)
Neutrophils Relative %: 46.5 %
Platelets: 284 10*3/uL (ref 140–400)
RBC: 4.54 10*6/uL (ref 3.80–5.10)
RDW: 12.7 % (ref 11.0–15.0)
Total Lymphocyte: 40.7 %
WBC: 5.6 10*3/uL (ref 3.8–10.8)

## 2019-03-07 LAB — COMPLETE METABOLIC PANEL WITH GFR
AG Ratio: 1.6 (calc) (ref 1.0–2.5)
ALT: 24 U/L (ref 6–29)
AST: 25 U/L (ref 10–35)
Albumin: 4.6 g/dL (ref 3.6–5.1)
Alkaline phosphatase (APISO): 76 U/L (ref 37–153)
BUN: 17 mg/dL (ref 7–25)
CO2: 24 mmol/L (ref 20–32)
Calcium: 9.6 mg/dL (ref 8.6–10.4)
Chloride: 104 mmol/L (ref 98–110)
Creat: 0.73 mg/dL (ref 0.60–0.93)
GFR, Est African American: 95 mL/min/{1.73_m2} (ref >=60)
GFR, Est Non African American: 82 mL/min/{1.73_m2} (ref >=60)
Globulin: 2.9 g/dL (calc) (ref 1.9–3.7)
Glucose, Bld: 103 mg/dL — ABNORMAL HIGH (ref 65–99)
Potassium: 3.9 mmol/L (ref 3.5–5.3)
Sodium: 140 mmol/L (ref 135–146)
Total Bilirubin: 0.5 mg/dL (ref 0.2–1.2)
Total Protein: 7.5 g/dL (ref 6.1–8.1)

## 2019-03-07 LAB — LIPID PANEL
Cholesterol: 150 mg/dL (ref <200)
HDL: 39 mg/dL — ABNORMAL LOW (ref >=50)
LDL Cholesterol (Calc): 90 mg/dL (calc)
Non-HDL Cholesterol (Calc): 111 mg/dL (calc) (ref <130)
Total CHOL/HDL Ratio: 3.8 (calc) (ref <5.0)
Triglycerides: 113 mg/dL (ref <150)

## 2019-03-07 LAB — HEMOGLOBIN A1C
Hgb A1c MFr Bld: 5.7 % of total Hgb — ABNORMAL HIGH (ref <5.7)
Mean Plasma Glucose: 117 (calc)
eAG (mmol/L): 6.5 (calc)

## 2019-03-20 ENCOUNTER — Ambulatory Visit (INDEPENDENT_AMBULATORY_CARE_PROVIDER_SITE_OTHER): Payer: PPO | Admitting: Family

## 2019-03-20 ENCOUNTER — Encounter: Payer: Self-pay | Admitting: Family

## 2019-03-20 ENCOUNTER — Other Ambulatory Visit: Payer: Self-pay

## 2019-03-20 VITALS — BP 140/90 | HR 84 | Temp 98.2°F | Resp 20 | Ht 63.0 in | Wt 147.8 lb

## 2019-03-20 DIAGNOSIS — E785 Hyperlipidemia, unspecified: Secondary | ICD-10-CM | POA: Diagnosis not present

## 2019-03-20 DIAGNOSIS — Z1159 Encounter for screening for other viral diseases: Secondary | ICD-10-CM | POA: Diagnosis not present

## 2019-03-20 DIAGNOSIS — I10 Essential (primary) hypertension: Secondary | ICD-10-CM

## 2019-03-20 DIAGNOSIS — E119 Type 2 diabetes mellitus without complications: Secondary | ICD-10-CM

## 2019-03-20 DIAGNOSIS — M1990 Unspecified osteoarthritis, unspecified site: Secondary | ICD-10-CM | POA: Diagnosis not present

## 2019-03-20 DIAGNOSIS — M8589 Other specified disorders of bone density and structure, multiple sites: Secondary | ICD-10-CM | POA: Diagnosis not present

## 2019-03-20 DIAGNOSIS — Z23 Encounter for immunization: Secondary | ICD-10-CM

## 2019-03-20 MED ORDER — ACCU-CHEK SOFT TOUCH LANCETS MISC: 1.0000 | Freq: Every day | 5 refills | Status: AC

## 2019-03-20 MED ORDER — ACCU-CHEK AVIVA DEVI: 1.0000 | Freq: Every day | 0 refills | Status: AC

## 2019-03-20 MED ORDER — ACCU-CHEK AVIVA PLUS VI STRP: 1.0000 | ORAL_STRIP | Freq: Every day | 5 refills | Status: AC

## 2019-03-20 NOTE — Progress Notes (Signed)
Provider: Dinah Ngetich FNP-C   [name], NP  Patient Care Team: [name], NP as PCP - General (Nurse Practitioner)  Extended Emergency Contact Information Primary Emergency Contact: Aura Fey Address: [address]          [place]  [place] Home Phone: [phone] Relation: Spouse Secondary Emergency Contact: Curlene Labrum Address: [address]          [place], [place] [address] Johnnette Litter of [place] Phone: [phone] Relation: Son  Code Status: Full code  Goals of care: Advanced Directive information Advanced Directives 03/20/2019  Does Patient Have a Medical Advance Directive? No  Type of Advance Directive -  Does patient want to make changes to medical advance directive? -  Copy of [place] in Chart? -  Would patient like information on creating a medical advance directive? -     Chief Complaint  Patient presents with  . Medical Management of Chronic Issues    6 Month Follow Up    HPI:  Pt is a 73 y.o. female seen today for 6 months for medical management of chronic diseases. She denies any acute issues during visit.she states no contact with sick person with COVID-19.she wears a mask,practices social distancing and hand hygiene.Has been staying home with the husband most of the time.   Type 2 DM - states CBG runs 100's whenever she checks at the [place] office.she request own Glucometer,strips and lancets.she states did not get previous ordered Glucometer though did not notify provider's office.currently on metformin 500 mg tablet daily.she states has been watching her diet.also does exercise by walking daily with her sister for 45 minutes except on Thursday and Friday she goes to big stores and walk.   Hyperlipidemia - latest LDL was on simvastatin 20 mg tablet daily.she is watching her diet and exercises as above.she denies any muscle aches or weakness.  Hypertension - checks B/p sometimes readings in the  120's/70's.Not on any medication.SBP 140 this visit though states tends to be high during visit.she tries to watch her diet/salt.she denies any headache,dizziness,chest pain,palpitation or shortness of breath.  Osteoarthritis - follows up with Orthopedic for left knee pain.Had cortisone injection 10/24/2018 given by Dr.Xu Naiping.states pain under control.Also uses topical analgesics.   Osteopenia - Latest [facility] Density done at [facility] Telephone # [phone] on 02/27/2019 L1-L3-L4  T-score -1.5,Left hip was -1.7 and right hip -1.5 she is currently on vitamin D supplement.she stats does not tolerate calcium.states usually take a prescription for a high dose of Vit D on chart review no recent Vit D level though patient states level was checked at the [facility].she will contact them for vit D script.will need records.  Health Maintenance  She is due for her PNA 23 vaccine 2nd dose and Hep C screening.she is low risk.    Past Medical History:  Diagnosis Date  . Acute bronchitis   . Anxiety   . Disorder of bone and cartilage, unspecified   . Elevated blood pressure reading without diagnosis of hypertension   . Myalgia and myositis, unspecified   . Osteoporosis, unspecified   . Other abnormal blood chemistry   . Other and unspecified hyperlipidemia   . Plantar fascial fibromatosis   . Type II or unspecified type diabetes mellitus without mention of complication, not stated as uncontrolled    Past Surgical History:  Procedure Laterality Date  . BREAST SURGERY  1997   reduction  . EYE SURGERY  Left eye contracts removed    Allergies  Allergen Reactions  . Codeine   . Sulfa Antibiotics     Allergies as of 03/20/2019      Reactions   Codeine    Sulfa Antibiotics       Medication List       Accurate as of March 20, 2019  9:27 AM. If you have any questions, ask your nurse or doctor.        STOP taking these medications    Besivance 0.6 % Susp Generic drug: Besifloxacin HCl Stopped by: Sandrea Hughs, NP   Durezol 0.05 % Emul Generic drug: Difluprednate Stopped by: Nelda Bucks Ngetich, NP   Prolensa 0.07 % Soln Generic drug: Bromfenac Sodium Stopped by: Sandrea Hughs, NP     TAKE these medications   cholecalciferol 1000 units tablet Commonly known as: VITAMIN D Take 5,000 Units by mouth daily.   metFORMIN 500 MG tablet Commonly known as: GLUCOPHAGE TAKE 1 TABLET BY MOUTH EVERY DAY WITH BREAKFAST TO CONTROL SUGAR   simvastatin 20 MG tablet Commonly known as: ZOCOR TAKE 1 TABLET BY MOUTH EVERY EVENING TO LOWER CHOLESTROL   Tylenol 8 Hour Arthritis Pain 650 MG CR tablet Generic drug: acetaminophen Take 1,300 mg by mouth daily as needed for pain.       Review of Systems  Constitutional: Negative for appetite change, chills, fatigue and fever.  HENT: Negative for congestion, rhinorrhea, sinus pressure, sinus pain, sneezing, sore throat and trouble swallowing.   Eyes: Positive for visual disturbance. Negative for pain, discharge, redness and itching.       Status post cataract surgery right eye 10/2018   Respiratory: Negative for cough, chest tightness, shortness of breath and wheezing.   Cardiovascular: Negative for chest pain, palpitations and leg swelling.  Gastrointestinal: Negative for abdominal distention, abdominal pain, constipation, diarrhea, nausea and vomiting.  Endocrine: Negative for cold intolerance, heat intolerance, polydipsia, polyphagia and polyuria.  Genitourinary: Negative for decreased urine volume, difficulty urinating, dysuria, flank pain, frequency, hematuria, vaginal bleeding, vaginal discharge and vaginal pain.       Gets up x 1 at bedtime   Musculoskeletal: Positive for arthralgias. Negative for back pain and gait problem.  Skin: Negative for color change, pallor and rash.  Neurological: Negative for dizziness, speech difficulty, weakness, light-headedness, numbness and  headaches.  Hematological: Does not bruise/bleed easily.  Psychiatric/Behavioral: Negative for agitation, confusion and sleep disturbance. The patient is not nervous/anxious.     Immunization History  Administered Date(s) Administered  . Influenza, Quadrivalent, Recombinant, Inj, Pf 02/13/2018  . Influenza-Unspecified 02/14/2015  . Pneumococcal Conjugate-13 10/22/2013  . Pneumococcal Polysaccharide-23 08/16/2016  . Td 04/24/2009  . Tdap 10/07/2016  . Zoster Recombinat (Shingrix) 10/19/2017, 01/17/2018   Pertinent  Health Maintenance Due  Topic Date Due  . FOOT EXAM  03/14/2018  . OPHTHALMOLOGY EXAM  07/16/2018  . INFLUENZA VACCINE  11/15/2018  . MAMMOGRAM  12/07/2018  . HEMOGLOBIN A1C  09/03/2019  . URINE MICROALBUMIN  09/05/2019  . COLONOSCOPY  06/29/2026  . DEXA SCAN  Completed  . PNA vac Low Risk Adult  Completed   Fall Risk  03/20/2019 09/19/2018 09/05/2018 03/07/2018 09/13/2017  Falls in the past year? 0 0 0 0 Yes  Number falls in past yr: - 0 0 - -  Injury with Fall? 0 0 0 - No    Vitals:   03/20/19 0907  BP: 140/90  Pulse: 84  Resp: 20  Temp: 98.2 F (36.8 C)  TempSrc: Oral  SpO2: 95%  Weight: 147 lb 12.8 oz (67 kg)  Height: 5\' 3"  (1.6 m)   Body mass index is 26.18 kg/m. Physical Exam Vitals signs reviewed.  Constitutional:      General: She is not in acute distress.    Appearance: She is overweight. She is not ill-appearing.  HENT:     Head: Normocephalic.     Right Ear: Tympanic membrane, ear canal and external ear normal. There is no impacted cerumen.     Left Ear: Tympanic membrane, ear canal and external ear normal. There is no impacted cerumen.     Nose: No congestion or rhinorrhea.     Mouth/Throat:     Mouth: Mucous membranes are moist.     Pharynx: Oropharynx is clear. No oropharyngeal exudate or posterior oropharyngeal erythema.  Eyes:     General: No scleral icterus.       Right eye: No discharge.        Left eye: No discharge.      Extraocular Movements: Extraocular movements intact.     Conjunctiva/sclera: Conjunctivae normal.     Pupils: Pupils are equal, round, and reactive to light.     Comments: Corrective lens in place   Neck:     Musculoskeletal: Normal range of motion. No neck rigidity or muscular tenderness.     Vascular: No carotid bruit.  Cardiovascular:     Rate and Rhythm: Normal rate and regular rhythm.     Pulses: Normal pulses.     Heart sounds: Normal heart sounds. No murmur. No friction rub. No gallop.   Pulmonary:     Effort: Pulmonary effort is normal. No respiratory distress.     Breath sounds: Normal breath sounds. No wheezing, rhonchi or rales.  Chest:     Chest wall: No tenderness.  Abdominal:     General: Bowel sounds are normal. There is no distension.     Palpations: Abdomen is soft. There is no mass.     Tenderness: There is no abdominal tenderness. There is no right CVA tenderness, left CVA tenderness, guarding or rebound.  Musculoskeletal: Normal range of motion.        General: No swelling or tenderness.     Right lower leg: No edema.     Left lower leg: No edema.  Lymphadenopathy:     Cervical: No cervical adenopathy.  Skin:    General: Skin is warm.     Coloration: Skin is not pale.     Findings: No bruising, erythema, lesion or rash.  Neurological:     Mental Status: She is oriented to person, place, and time.     Cranial Nerves: No cranial nerve deficit.     Sensory: No sensory deficit.     Motor: No weakness.     Coordination: Coordination normal.     Gait: Gait normal.  Psychiatric:        Mood and Affect: Mood normal.        Behavior: Behavior normal.        Thought Content: Thought content normal.        Judgment: Judgment normal.    Labs reviewed: Recent Labs    08/29/18 0803 03/06/19 0857  NA 139 140  K 4.3 3.9  CL 104 104  CO2 28 24  GLUCOSE 108* 103*  BUN 15 17  CREATININE 0.75 0.73  CALCIUM 9.9 9.6   Recent Labs    08/29/18 0803 03/06/19  0857  AST 32 25  ALT 37* 24  BILITOT 0.7 0.5  PROT 7.5 7.5   Recent Labs    08/29/18 0803 03/06/19 0857  WBC 5.7 5.6  NEUTROABS 2,850 2,604  HGB 13.6 13.4  HCT 40.3 40.4  MCV 86.1 89.0  PLT 271 284   Lab Results  Component Value Date   TSH 1.67 10/28/2014   Lab Results  Component Value Date   HGBA1C 5.7 (H) 03/06/2019   Lab Results  Component Value Date   CHOL 150 03/06/2019   HDL 39 (L) 03/06/2019   LDLCALC 90 03/06/2019   TRIG 113 03/06/2019   CHOLHDL 3.8 03/06/2019    Significant Diagnostic Results in last 30 days:  No results found.  Assessment/Plan 1. Controlled type 2 diabetes mellitus without complication, without long-term current use of insulin (HCC) Lab Results  Component Value Date   HGBA1C 5.7 (H) 03/06/2019  Continue on metformin 500 mg tablet  - Hemoglobin A1c; Future - glucose blood (ACCU-CHEK AVIVA PLUS) test strip; 1 each by Other route daily.  Dispense: 100 each; Refill: 5 - Lancets (ACCU-CHEK SOFT TOUCH) lancets; 1 each by Other route daily.  Dispense: 100 each; Refill: 5 - Blood Glucose Monitoring Suppl (ACCU-CHEK AVIVA) device; 1 each by Other route daily. Check blood sugar once daily  Dispense: 1 each; Refill: 0  2. Essential hypertension, benign B/p not at goal this visit though states her work check B/p usually in the 120's/70.will continue to monitor for now and notify provider if SBP still 140.Will start on ACE inhibitor or ARB if needed which will also help with her renal protection due to Type 2 DM. - CBC with Differential/Platelet; Future - COMPLETE METABOLIC PANEL WITH GFR; Future - TSH; Future  3. Osteoarthritis, unspecified osteoarthritis type, unspecified site Status post cortisone injection with Dr.Xu Naiping. - continue current topical analgesic and follow up with Orthopedic as needed.  - Continue on Tylenol as needed. - continue with walking exercises daily.    4. Hyperlipidemia LDL goal <70 LDL 90 not at goal.continue  on simvastatin 20 mg tablet daily.dietary and lifestyle modification.  - Lipid panel; Future  5. Osteopenia of multiple sites Latest bone density reviewed done at [facility] Telephone # [phone] on 02/27/2019 L1-L3-L4  T-score -1.5,Left hip was -1.7 and right hip -1.5  Continue on vitamin D supplement.Not on calcium states does not tolerate calcium supplement.  - Vitamin D, 25-hydroxy; Future  6. Encounter for hepatitis C screening test for low risk patient Low risk.Never been screened.  - Hep C Antibody; Future   7. Need for pneumococcal vaccination Has had one dose of PNA 23.Afebrile.No symptoms of URI's. - Pneumococcal polysaccharide vaccine 23-valent greater than or equal to 2yo subcutaneous/IM administered by CMA.   Family/ staff Communication: Reviewed plan of care with patient.   Labs/tests ordered:   - CBC with Differential/Platelet; Future - COMPLETE METABOLIC PANEL WITH GFR; Future - TSH; Future - Hemoglobin A1c; Future - Lipid panel; Future - Vitamin D, 25-hydroxy; Future - Hep C Antibody; Future  Next appointment : 6 months for medical management of chronic issues labs prior to visit.   Sandrea Hughs, NP

## 2019-04-29 DIAGNOSIS — M810 Age-related osteoporosis without current pathological fracture: Secondary | ICD-10-CM | POA: Diagnosis not present

## 2019-05-05 ENCOUNTER — Ambulatory Visit: Payer: PPO | Attending: Internal Medicine

## 2019-05-05 DIAGNOSIS — Z20822 Contact with and (suspected) exposure to covid-19: Secondary | ICD-10-CM

## 2019-05-06 LAB — NOVEL CORONAVIRUS, NAA: SARS-CoV-2, NAA: NOT DETECTED

## 2019-05-08 ENCOUNTER — Ambulatory Visit: Payer: PPO

## 2019-05-29 ENCOUNTER — Ambulatory Visit: Payer: PPO | Attending: Internal Medicine

## 2019-05-29 DIAGNOSIS — Z23 Encounter for immunization: Secondary | ICD-10-CM | POA: Insufficient documentation

## 2019-05-29 NOTE — Progress Notes (Signed)
   Covid-19 Vaccination Clinic  Name:  Jasmine James    MRN: [id number] DOB: Jan 05, 1946  05/29/2019  Jasmine James was observed post Covid-19 immunization for 15 minutes without incidence. She was provided with Vaccine Information Sheet and instruction to access the V-Safe system.   Jasmine James was instructed to call 911 with any severe reactions post vaccine: Marland Kitchen Difficulty breathing  . Swelling of your face and throat  . A fast heartbeat  . A bad rash all over your body  . Dizziness and weakness    Immunizations Administered    Name Date Dose VIS Date Route   Pfizer COVID-19 Vaccine 05/29/2019  3:02 PM 0.3 mL 03/27/2019 Intramuscular   Manufacturer: [place]   Lot: [id number]   [place]: [id number]

## 2019-06-23 ENCOUNTER — Ambulatory Visit: Payer: PPO | Attending: Internal Medicine

## 2019-06-23 DIAGNOSIS — Z23 Encounter for immunization: Secondary | ICD-10-CM | POA: Insufficient documentation

## 2019-06-23 NOTE — Progress Notes (Signed)
   Covid-19 Vaccination Clinic  Name:  AKEA SIVERTSEN    MRN: [id number] DOB: Sep 08, 1945  06/23/2019  Ms. Vides was observed post Covid-19 immunization for 15 minutes without incident. She was provided with Vaccine Information Sheet and instruction to access the V-Safe system.   Ms. Carrete was instructed to call 911 with any severe reactions post vaccine: Marland Kitchen Difficulty breathing  . Swelling of face and throat  . A fast heartbeat  . A bad rash all over body  . Dizziness and weakness   Immunizations Administered    Name Date Dose VIS Date Route   Pfizer COVID-19 Vaccine 06/23/2019  1:07 PM 0.3 mL 03/27/2019 Intramuscular   Manufacturer: [place]   Lot: [id number]   [place]: [id number]

## 2019-08-09 ENCOUNTER — Other Ambulatory Visit: Payer: Self-pay | Admitting: Nurse Practitioner

## 2019-08-10 NOTE — Telephone Encounter (Signed)
rx sent to pharmacy by e-script  

## 2019-09-11 ENCOUNTER — Other Ambulatory Visit: Payer: PPO

## 2019-09-11 ENCOUNTER — Other Ambulatory Visit: Payer: Self-pay

## 2019-09-11 DIAGNOSIS — M8589 Other specified disorders of bone density and structure, multiple sites: Secondary | ICD-10-CM

## 2019-09-11 DIAGNOSIS — I10 Essential (primary) hypertension: Secondary | ICD-10-CM

## 2019-09-11 DIAGNOSIS — Z1159 Encounter for screening for other viral diseases: Secondary | ICD-10-CM | POA: Diagnosis not present

## 2019-09-11 DIAGNOSIS — E785 Hyperlipidemia, unspecified: Secondary | ICD-10-CM

## 2019-09-11 DIAGNOSIS — E119 Type 2 diabetes mellitus without complications: Secondary | ICD-10-CM | POA: Diagnosis not present

## 2019-09-15 LAB — COMPLETE METABOLIC PANEL WITH GFR
AG Ratio: 1.6 (calc) (ref 1.0–2.5)
ALT: 19 U/L (ref 6–29)
AST: 22 U/L (ref 10–35)
Albumin: 4.7 g/dL (ref 3.6–5.1)
Alkaline phosphatase (APISO): 76 U/L (ref 37–153)
BUN: 14 mg/dL (ref 7–25)
CO2: 23 mmol/L (ref 20–32)
Calcium: 9.8 mg/dL (ref 8.6–10.4)
Chloride: 105 mmol/L (ref 98–110)
Creat: 0.74 mg/dL (ref 0.60–0.93)
GFR, Est African American: 93 mL/min/{1.73_m2} (ref >=60)
GFR, Est Non African American: 80 mL/min/{1.73_m2} (ref >=60)
Globulin: 2.9 g/dL (calc) (ref 1.9–3.7)
Glucose, Bld: 107 mg/dL — ABNORMAL HIGH (ref 65–99)
Potassium: 4 mmol/L (ref 3.5–5.3)
Sodium: 139 mmol/L (ref 135–146)
Total Bilirubin: 0.5 mg/dL (ref 0.2–1.2)
Total Protein: 7.6 g/dL (ref 6.1–8.1)

## 2019-09-15 LAB — LIPID PANEL
Cholesterol: 151 mg/dL (ref <200)
HDL: 42 mg/dL — ABNORMAL LOW (ref >=50)
LDL Cholesterol (Calc): 88 mg/dL (calc)
Non-HDL Cholesterol (Calc): 109 mg/dL (calc) (ref <130)
Total CHOL/HDL Ratio: 3.6 (calc) (ref <5.0)
Triglycerides: 110 mg/dL (ref <150)

## 2019-09-15 LAB — CBC WITH DIFFERENTIAL/PLATELET
Absolute Monocytes: 605 cells/uL (ref 200–950)
Basophils Absolute: 61 cells/uL (ref 0–200)
Basophils Relative: 1.1 %
Eosinophils Absolute: 110 cells/uL (ref 15–500)
Eosinophils Relative: 2 %
HCT: 41.8 % (ref 35.0–45.0)
Hemoglobin: 13.9 g/dL (ref 11.7–15.5)
Lymphs Abs: 2607 cells/uL (ref 850–3900)
MCH: 29 pg (ref 27.0–33.0)
MCHC: 33.3 g/dL (ref 32.0–36.0)
MCV: 87.3 fL (ref 80.0–100.0)
MPV: 9.4 fL (ref 7.5–12.5)
Monocytes Relative: 11 %
Neutro Abs: 2118 cells/uL (ref 1500–7800)
Neutrophils Relative %: 38.5 %
Platelets: 273 10*3/uL (ref 140–400)
RBC: 4.79 10*6/uL (ref 3.80–5.10)
RDW: 13.1 % (ref 11.0–15.0)
Total Lymphocyte: 47.4 %
WBC: 5.5 10*3/uL (ref 3.8–10.8)

## 2019-09-15 LAB — HEMOGLOBIN A1C
Hgb A1c MFr Bld: 5.6 % of total Hgb (ref <5.7)
Mean Plasma Glucose: 114 (calc)
eAG (mmol/L): 6.3 (calc)

## 2019-09-15 LAB — HEPATITIS C ANTIBODY
Hepatitis C Ab: NONREACTIVE
SIGNAL TO CUT-OFF: 0.02 (ref <1.00)

## 2019-09-15 LAB — VITAMIN D 25 HYDROXY (VIT D DEFICIENCY, FRACTURES): Vit D, 25-Hydroxy: 57 ng/mL (ref 30–100)

## 2019-09-15 LAB — TSH: TSH: 2.31 mIU/L (ref 0.40–4.50)

## 2019-09-18 ENCOUNTER — Ambulatory Visit (INDEPENDENT_AMBULATORY_CARE_PROVIDER_SITE_OTHER): Payer: PPO | Admitting: Family

## 2019-09-18 ENCOUNTER — Encounter: Payer: Self-pay | Admitting: Family

## 2019-09-18 ENCOUNTER — Other Ambulatory Visit: Payer: Self-pay

## 2019-09-18 VITALS — BP 142/80 | HR 85 | Temp 96.8°F | Ht 63.0 in | Wt 145.8 lb

## 2019-09-18 DIAGNOSIS — M8589 Other specified disorders of bone density and structure, multiple sites: Secondary | ICD-10-CM

## 2019-09-18 DIAGNOSIS — E663 Overweight: Secondary | ICD-10-CM | POA: Diagnosis not present

## 2019-09-18 DIAGNOSIS — E119 Type 2 diabetes mellitus without complications: Secondary | ICD-10-CM

## 2019-09-18 DIAGNOSIS — I10 Essential (primary) hypertension: Secondary | ICD-10-CM

## 2019-09-18 DIAGNOSIS — E785 Hyperlipidemia, unspecified: Secondary | ICD-10-CM

## 2019-09-18 NOTE — Progress Notes (Signed)
Provider: Mehar Sagen FNP-C   [name], NP  Patient Care Team: [name], NP as PCP - General (Nurse Practitioner)  Extended Emergency Contact Information Primary Emergency Contact: Aura Fey Address: [address]          [place]  [place] Home Phone: [phone] Relation: Spouse Secondary Emergency Contact: Curlene Labrum Address: [address]          [place], [place] [address] Johnnette Litter of [place] Phone: [phone] Relation: Son  Code Status: Full Code  Goals of care: Advanced Directive information Advanced Directives 09/18/2019  Does Patient Have a Medical Advance Directive? Yes  Type of Advance Directive Living will  Does patient want to make changes to medical advance directive? No - Patient declined  Copy of [place] in Chart? -  Would patient like information on creating a medical advance directive? -     Chief Complaint  Patient presents with  . Medical Management of Chronic Issues    6 month follow-up   . Quality Metric Gaps    Foot Exam due today. Discuss need for MALB     HPI:  Pt is a 74 y.o. female seen today for 6 months follow up medical management of chronic diseases.she denies any acute issues.Recent lab results discussed with patient.  Hypertension - states home SBP in the 120's-130's.off medication.denies any signs headache,dizziness,vision changes,chest pain or shortness of breath.  Hyperlipidemia - chol 151,TRG 110,LDL 88 ( 09/11/2019) reviewed and discussed during visit.on simvastatin 20 mg tablet daily.she denies any muscle weakness or aches.  Type 2 DM - A1C 5.6 previous 5.7>5.9 states CBG at  Home are in the 120's-140's.on metformin 500 mg table daily.denies any numbness or tingling of extremities.Follows up with Ophthalmology.Due for foot exam and urine micro Albumin.    Osteoarthritis  - takes over the counter tylenol as needed.pain under controlled.  Osteopenia - latest bone density  reviewed AP spine T-score -1.5,left femur neck T-score -1.7 and right femur neck T-score  -1.5 (02/27/2019). On Vitamin 5000 units 3 capsule daily.she denies any fall or fracture.    Past Medical History:  Diagnosis Date  . Acute bronchitis   . Anxiety   . Disorder of bone and cartilage, unspecified   . Elevated blood pressure reading without diagnosis of hypertension   . Myalgia and myositis, unspecified   . Osteoporosis, unspecified   . Other abnormal blood chemistry   . Other and unspecified hyperlipidemia   . Plantar fascial fibromatosis   . Type II or unspecified type diabetes mellitus without mention of complication, not stated as uncontrolled    Past Surgical History:  Procedure Laterality Date  . BREAST SURGERY  1997   reduction  . EYE SURGERY     Left eye contracts removed    Allergies  Allergen Reactions  . Codeine   . Sulfa Antibiotics     Allergies as of 09/18/2019      Reactions   Codeine    Sulfa Antibiotics       Medication List       Accurate as of September 18, 2019  8:39 AM. If you have any questions, ask your nurse or doctor.        Accu-Chek Aviva device 1 each by Other route daily. Check blood sugar once daily   Accu-Chek Aviva Plus test strip Generic drug: glucose blood 1 each by Other route daily.   accu-chek soft touch lancets 1 each by Other route daily.   metFORMIN  500 MG tablet Commonly known as: GLUCOPHAGE TAKE 1 TABLET BY MOUTH EVERY DAY WITH BREAKFAST TO CONTROL SUGAR   simvastatin 20 MG tablet Commonly known as: ZOCOR TAKE 1 TABLET BY MOUTH EVERY EVENING TO LOWER CHOLESTROL   Tylenol 8 Hour Arthritis Pain 650 MG CR tablet Generic drug: acetaminophen Take 1,300 mg by mouth daily as needed for pain.   Vitamin D 125 MCG (5000 UT) Caps Take 3 capsules by mouth daily.       Review of Systems  Constitutional: Negative for appetite change, chills, fatigue, fever and unexpected weight change.  HENT: Negative for congestion,  hearing loss, rhinorrhea, sinus pressure, sinus pain, sneezing, sore throat, tinnitus and trouble swallowing.   Eyes: Positive for visual disturbance. Negative for discharge, redness and itching.       Wears eye glasses follow s with Ophthalmology status post  cataract surgery   Respiratory: Negative for cough, chest tightness, shortness of breath and wheezing.   Cardiovascular: Negative for chest pain, palpitations and leg swelling.  Gastrointestinal: Negative for abdominal distention, abdominal pain, blood in stool, constipation, diarrhea, nausea and vomiting.  Endocrine: Negative for cold intolerance, heat intolerance, polydipsia, polyphagia and polyuria.  Genitourinary: Negative for decreased urine volume, difficulty urinating, dysuria, flank pain, frequency, hematuria, urgency and vaginal discharge.  Musculoskeletal: Negative for arthralgias, gait problem, joint swelling and myalgias.  Skin: Negative for color change, pallor, rash and wound.  Neurological: Negative for dizziness, speech difficulty, weakness, light-headedness, numbness and headaches.  Hematological: Does not bruise/bleed easily.  Psychiatric/Behavioral: Negative for agitation, confusion and sleep disturbance. The patient is not nervous/anxious.     Immunization History  Administered Date(s) Administered  . Influenza, High Dose Seasonal PF 01/29/2019  . Influenza, Quadrivalent, Recombinant, Inj, Pf 02/13/2018  . Influenza-Unspecified 02/14/2015  . PFIZER SARS-COV-2 Vaccination 05/29/2019, 06/23/2019  . Pneumococcal Conjugate-13 10/22/2013  . Pneumococcal Polysaccharide-23 08/16/2016, 03/20/2019  . Td 04/24/2009  . Tdap 10/07/2016  . Zoster Recombinat (Shingrix) 10/19/2017, 01/17/2018   Pertinent  Health Maintenance Due  Topic Date Due  . FOOT EXAM  03/14/2018  . URINE MICROALBUMIN  09/05/2019  . OPHTHALMOLOGY EXAM  11/14/2019  . INFLUENZA VACCINE  11/15/2019  . HEMOGLOBIN A1C  03/13/2020  . MAMMOGRAM  02/26/2021    . COLONOSCOPY  06/29/2026  . DEXA SCAN  Completed  . PNA vac Low Risk Adult  Completed   Fall Risk  09/18/2019 03/20/2019 09/19/2018 09/05/2018 03/07/2018  Falls in the past year? 0 0 0 0 0  Number falls in past yr: 0 - 0 0 -  Injury with Fall? 0 0 0 0 -    Vitals:   09/18/19 0836  BP: (!) 142/80  Pulse: 85  Temp: (!) 96.8 F (36 C)  TempSrc: Temporal  SpO2: 98%  Weight: 145 lb 12.8 oz (66.1 kg)  Height: 5\' 3"  (1.6 m)   Body mass index is 25.83 kg/m. Physical Exam Vitals reviewed.  Constitutional:      General: She is not in acute distress.    Appearance: She is overweight. She is not ill-appearing.  HENT:     Head: Normocephalic.     Right Ear: Tympanic membrane, ear canal and external ear normal. There is no impacted cerumen.     Left Ear: Tympanic membrane, ear canal and external ear normal. There is no impacted cerumen.     Nose: Nose normal. No congestion or rhinorrhea.     Mouth/Throat:     Mouth: Mucous membranes are moist.  Pharynx: Oropharynx is clear. No oropharyngeal exudate or posterior oropharyngeal erythema.  Eyes:     General: No scleral icterus.       Right eye: No discharge.        Left eye: No discharge.     Extraocular Movements: Extraocular movements intact.     Conjunctiva/sclera: Conjunctivae normal.     Pupils: Pupils are equal, round, and reactive to light.  Neck:     Vascular: No carotid bruit.  Cardiovascular:     Rate and Rhythm: Normal rate and regular rhythm.     Pulses: Normal pulses.     Heart sounds: Normal heart sounds. No murmur. No friction rub. No gallop.   Pulmonary:     Effort: Pulmonary effort is normal. No respiratory distress.     Breath sounds: Normal breath sounds. No wheezing, rhonchi or rales.  Chest:     Chest wall: No tenderness.  Abdominal:     General: Bowel sounds are normal. There is no distension.     Palpations: Abdomen is soft. There is no mass.     Tenderness: There is no abdominal tenderness. There is no  right CVA tenderness, left CVA tenderness, guarding or rebound.  Musculoskeletal:        General: No swelling or tenderness. Normal range of motion.     Cervical back: Normal range of motion. No rigidity or tenderness.     Right lower leg: No edema.     Left lower leg: No edema.  Lymphadenopathy:     Cervical: No cervical adenopathy.  Skin:    General: Skin is warm and dry.     Coloration: Skin is not pale.     Findings: No bruising, erythema or rash.  Neurological:     Mental Status: She is alert and oriented to person, place, and time.     Cranial Nerves: No cranial nerve deficit.     Sensory: No sensory deficit.     Motor: No weakness.     Coordination: Coordination normal.     Gait: Gait normal.  Psychiatric:        Mood and Affect: Mood normal.        Behavior: Behavior normal.        Thought Content: Thought content normal.        Judgment: Judgment normal.    Labs reviewed: Recent Labs    03/06/19 0857 09/11/19 0807  NA 140 139  K 3.9 4.0  CL 104 105  CO2 24 23  GLUCOSE 103* 107*  BUN 17 14  CREATININE 0.73 0.74  CALCIUM 9.6 9.8   Recent Labs    03/06/19 0857 09/11/19 0807  AST 25 22  ALT 24 19  BILITOT 0.5 0.5  PROT 7.5 7.6   Recent Labs    03/06/19 0857 09/11/19 0807  WBC 5.6 5.5  NEUTROABS 2,604 2,118  HGB 13.4 13.9  HCT 40.4 41.8  MCV 89.0 87.3  PLT 284 273   Lab Results  Component Value Date   TSH 2.31 09/11/2019   Lab Results  Component Value Date   HGBA1C 5.6 09/11/2019   Lab Results  Component Value Date   CHOL 151 09/11/2019   HDL 42 (L) 09/11/2019   LDLCALC 88 09/11/2019   TRIG 110 09/11/2019   CHOLHDL 3.6 09/11/2019    Significant Diagnostic Results in last 30 days:  No results found.  Assessment/Plan 1. Controlled type 2 diabetes mellitus without complication, without long-term current use of insulin ([place]) Lab  Results  Component Value Date   HGBA1C 5.6 09/11/2019  CBG controlled. - continue on metformin 500 mg  tablet daily. Eye exam up to date.foot exam completed today sensation intact. - encouraged to continue with dietary modification and exercise.  - Microalbumin / creatinine urine ratio  2. Essential hypertension, benign B/p not at goal though home readings at goal.Advised to check blood pressure and record notify provider if B/p > 140/90.will continue to monitor for now then add ARB or ACE inhibitor next visit if SBP not at goal.   3. Hyperlipidemia LDL goal <70 LDL close to goal. - continue on dietary modification and exercise. - continue on simvastatin 20 mg tablet daily.   4. Osteopenia of multiple sites Latest bone density reviewed. Continue on vitamin D supplement.   5. Overweight with body mass index (BMI) 25.0-29.9 BMI 25.83  - continue on dietary modification and exercise as above.  - additional information on DASH diet on AVS.  Family/ staff Communication: Reviewed plan of care with patient verbalized understanding.  Labs/tests ordered:  - Microalbumin / creatinine urine ratio  Next Appointment : 4 months for medical management of chronic issues.  Sandrea Hughs, NP

## 2019-09-18 NOTE — Patient Instructions (Signed)
- check blood pressure and record notify provider if B/p > 140/90  - continue with diet modification and exercise.  DASH Eating Plan DASH stands for "Dietary Approaches to Stop Hypertension." The DASH eating plan is a healthy eating plan that has been shown to reduce high blood pressure (hypertension). It may also reduce your risk for type 2 diabetes, heart disease, and stroke. The DASH eating plan may also help with weight loss. What are tips for following this plan?  General guidelines  Avoid eating more than 2,300 mg (milligrams) of salt (sodium) a day. If you have hypertension, you may need to reduce your sodium intake to 1,500 mg a day.  Limit alcohol intake to no more than 1 drink a day for nonpregnant women and 2 drinks a day for men. One drink equals 12 oz of beer, 5 oz of wine, or 1 oz of hard liquor.  Work with your health care provider to maintain a healthy body weight or to lose weight. Ask what an ideal weight is for you.  Get at least 30 minutes of exercise that causes your heart to beat faster (aerobic exercise) most days of the week. Activities may include walking, swimming, or biking.  Work with your health care provider or diet and nutrition specialist (dietitian) to adjust your eating plan to your individual calorie needs. Reading food labels   Check food labels for the amount of sodium per serving. Choose foods with less than 5 percent of the Daily Value of sodium. Generally, foods with less than 300 mg of sodium per serving fit into this eating plan.  To find whole grains, look for the word "whole" as the first word in the ingredient list. Shopping  Buy products labeled as "low-sodium" or "no salt added."  Buy fresh foods. Avoid canned foods and premade or frozen meals. Cooking  Avoid adding salt when cooking. Use salt-free seasonings or herbs instead of table salt or sea salt. Check with your health care provider or pharmacist before using salt substitutes.  Do  not fry foods. Cook foods using healthy methods such as baking, boiling, grilling, and broiling instead.  Cook with heart-healthy oils, such as olive, canola, soybean, or sunflower oil. Meal planning  Eat a balanced diet that includes: ? 5 or more servings of fruits and vegetables each day. At each meal, try to fill half of your plate with fruits and vegetables. ? Up to 6-8 servings of whole grains each day. ? Less than 6 oz of lean meat, poultry, or fish each day. A 3-oz serving of meat is about the same size as a deck of cards. One egg equals 1 oz. ? 2 servings of low-fat dairy each day. ? A serving of nuts, seeds, or beans 5 times each week. ? Heart-healthy fats. Healthy fats called Omega-3 fatty acids are found in foods such as flaxseeds and coldwater fish, like sardines, salmon, and mackerel.  Limit how much you eat of the following: ? Canned or prepackaged foods. ? Food that is high in trans fat, such as fried foods. ? Food that is high in saturated fat, such as fatty meat. ? Sweets, desserts, sugary drinks, and other foods with added sugar. ? Full-fat dairy products.  Do not salt foods before eating.  Try to eat at least 2 vegetarian meals each week.  Eat more home-cooked food and less restaurant, buffet, and fast food.  When eating at a restaurant, ask that your food be prepared with less salt or no  salt, if possible. What foods are recommended? The items listed may not be a complete list. Talk with your dietitian about what dietary choices are best for you. Grains Whole-grain or whole-wheat bread. Whole-grain or whole-wheat pasta. Brown rice. [place]. Bulgur. Whole-grain and low-sodium cereals. Pita bread. Low-fat, low-sodium crackers. Whole-wheat flour tortillas. Vegetables Fresh or frozen vegetables (raw, steamed, roasted, or grilled). Low-sodium or reduced-sodium tomato and vegetable juice. Low-sodium or reduced-sodium tomato sauce and tomato paste. Low-sodium or  reduced-sodium canned vegetables. Fruits All fresh, dried, or frozen fruit. Canned fruit in natural juice (without added sugar). Meat and other protein foods Skinless chicken or [place]. Ground chicken or [place]. Pork with fat trimmed off. Fish and seafood. Egg whites. Dried beans, peas, or lentils. Unsalted nuts, nut butters, and seeds. Unsalted canned beans. Lean cuts of beef with fat trimmed off. Low-sodium, lean deli meat. Dairy Low-fat (1%) or fat-free (skim) milk. Fat-free, low-fat, or reduced-fat cheeses. Nonfat, low-sodium ricotta or cottage cheese. Low-fat or nonfat yogurt. Low-fat, low-sodium cheese. Fats and oils Soft margarine without trans fats. Vegetable oil. Low-fat, reduced-fat, or light mayonnaise and salad dressings (reduced-sodium). Canola, safflower, olive, soybean, and sunflower oils. Avocado. Seasoning and other foods Herbs. Spices. Seasoning mixes without salt. Unsalted popcorn and pretzels. Fat-free sweets. What foods are not recommended? The items listed may not be a complete list. Talk with your dietitian about what dietary choices are best for you. Grains Baked goods made with fat, such as croissants, muffins, or some breads. Dry pasta or rice meal packs. Vegetables Creamed or fried vegetables. Vegetables in a cheese sauce. Regular canned vegetables (not low-sodium or reduced-sodium). Regular canned tomato sauce and paste (not low-sodium or reduced-sodium). Regular tomato and vegetable juice (not low-sodium or reduced-sodium). Angie Fava. Olives. Fruits Canned fruit in a light or heavy syrup. Fried fruit. Fruit in cream or butter sauce. Meat and other protein foods Fatty cuts of meat. Ribs. Fried meat. Berniece Salines. Sausage. Bologna and other processed lunch meats. Salami. Fatback. Hotdogs. Bratwurst. Salted nuts and seeds. Canned beans with added salt. Canned or smoked fish. Whole eggs or egg yolks. Chicken or [place] with skin. Dairy Whole or 2% milk, cream, and half-and-half.  Whole or full-fat cream cheese. Whole-fat or sweetened yogurt. Full-fat cheese. Nondairy creamers. Whipped toppings. Processed cheese and cheese spreads. Fats and oils Butter. Stick margarine. Lard. Shortening. Ghee. Bacon fat. Tropical oils, such as coconut, palm kernel, or palm oil. Seasoning and other foods Salted popcorn and pretzels. Onion salt, garlic salt, seasoned salt, table salt, and sea salt. Worcestershire sauce. Tartar sauce. Barbecue sauce. Teriyaki sauce. Soy sauce, including reduced-sodium. Steak sauce. Canned and packaged gravies. Fish sauce. Oyster sauce. Cocktail sauce. Horseradish that you find on the shelf. Ketchup. Mustard. Meat flavorings and tenderizers. Bouillon cubes. Hot sauce and Tabasco sauce. Premade or packaged marinades. Premade or packaged taco seasonings. Relishes. Regular salad dressings. Where to find more information:  National Heart, Lung, and [place]: https://wilson-eaton.com/  American Heart Association: www.heart.org Summary  The DASH eating plan is a healthy eating plan that has been shown to reduce high blood pressure (hypertension). It may also reduce your risk for type 2 diabetes, heart disease, and stroke.  With the DASH eating plan, you should limit salt (sodium) intake to 2,300 mg a day. If you have hypertension, you may need to reduce your sodium intake to 1,500 mg a day.  When on the DASH eating plan, aim to eat more fresh fruits and vegetables, whole grains, lean proteins, low-fat dairy, and heart-healthy  fats.  Work with your health care provider or diet and nutrition specialist (dietitian) to adjust your eating plan to your individual calorie needs. This information is not intended to replace advice given to you by your health care provider. Make sure you discuss any questions you have with your health care provider. Document Revised: 03/15/2017 Document Reviewed: 03/26/2016 Elsevier Patient Education  2020 [employer].

## 2019-09-19 LAB — MICROALBUMIN / CREATININE URINE RATIO
Creatinine, Urine: 65 mg/dL (ref 20–275)
Microalb Creat Ratio: 6 mcg/mg creat (ref <30)
Microalb, Ur: 0.4 mg/dL

## 2019-09-24 ENCOUNTER — Telehealth: Payer: Self-pay

## 2019-09-24 ENCOUNTER — Encounter: Payer: Self-pay | Admitting: Nurse Practitioner

## 2019-09-24 ENCOUNTER — Ambulatory Visit (INDEPENDENT_AMBULATORY_CARE_PROVIDER_SITE_OTHER): Payer: PPO | Admitting: Nurse Practitioner

## 2019-09-24 ENCOUNTER — Other Ambulatory Visit: Payer: Self-pay

## 2019-09-24 DIAGNOSIS — Z0000 Encounter for general adult medical examination without abnormal findings: Secondary | ICD-10-CM | POA: Diagnosis not present

## 2019-09-24 NOTE — Progress Notes (Signed)
Subjective:   Jasmine James is a 74 y.o. female who presents for Medicare Annual (Subsequent) preventive examination.  Review of Systems:   Cardiac Risk Factors include: advanced age (>37men, >26 women);diabetes mellitus;hypertension;dyslipidemia     Objective:     Vitals: There were no vitals taken for this visit.  There is no height or weight on file to calculate BMI.  Advanced Directives 09/24/2019 09/18/2019 03/20/2019 09/05/2018 05/20/2018 03/07/2018 09/13/2017  Does Patient Have a Medical Advance Directive? Yes Yes No Yes No Yes Yes  Type of Advance Directive Living will Living will - Living will - [place];Living will [place];Living will  Does patient want to make changes to medical advance directive? No - Patient declined No - Patient declined - No - Patient declined - - Yes (MAU/Ambulatory/Procedural Areas - Information given)  Copy of Healthcare Power of Attorney in Chart? - - - - - No - copy requested No - copy requested  Would patient like information on creating a medical advance directive? - - - - - - -    Tobacco Social History   Tobacco Use  Smoking Status Never Smoker  Smokeless Tobacco Never Used     Counseling given: Not Answered   Clinical Intake:  Pre-visit preparation completed: Yes  Pain : No/denies pain     BMI - recorded: 25 Nutritional Status: BMI 25 -29 Overweight Nutritional Risks: None Diabetes: No  How often do you need to have someone help you when you read instructions, pamphlets, or other written materials from your doctor or pharmacy?: 1 - Never        Past Medical History:  Diagnosis Date  . Acute bronchitis   . Anxiety   . Disorder of bone and cartilage, unspecified   . Elevated blood pressure reading without diagnosis of hypertension   . Myalgia and myositis, unspecified   . Osteoporosis, unspecified   . Other abnormal blood chemistry   . Other and unspecified hyperlipidemia   .  Plantar fascial fibromatosis   . Type II or unspecified type diabetes mellitus without mention of complication, not stated as uncontrolled    Past Surgical History:  Procedure Laterality Date  . BREAST SURGERY  1997   reduction  . EYE SURGERY     Left eye contracts removed   History reviewed. No pertinent family history. Social History   Socioeconomic History  . Marital status: Married    Spouse name: Not on file  . Number of children: Not on file  . Years of education: Not on file  . Highest education level: Not on file  Occupational History  . Not on file  Tobacco Use  . Smoking status: Never Smoker  . Smokeless tobacco: Never Used  Vaping Use  . Vaping Use: Never used  Substance and Sexual Activity  . Alcohol use: Yes    Comment: glass wine every third day  . Drug use: No  . Sexual activity: Not Currently  Other Topics Concern  . Not on file  Social History Narrative  . Not on file   Social Determinants of Health   Financial Resource Strain:   . Difficulty of Paying Living Expenses:   Food Insecurity:   . Worried About Charity fundraiser in the Last Year:   . Arboriculturist in the Last Year:   Transportation Needs:   . Film/video editor (Medical):   Marland Kitchen Lack of Transportation (Non-Medical):   Physical Activity:   .  Days of Exercise per Week:   . Minutes of Exercise per Session:   Stress:   . Feeling of Stress :   Social Connections:   . Frequency of Communication with Friends and Family:   . Frequency of Social Gatherings with Friends and Family:   . Attends Religious Services:   . Active Member of Clubs or Organizations:   . Attends Archivist Meetings:   Marland Kitchen Marital Status:     Outpatient Encounter Medications as of 09/24/2019  Medication Sig  . acetaminophen (TYLENOL 8 HOUR ARTHRITIS PAIN) 650 MG CR tablet Take 1,300 mg by mouth daily as needed for pain.  . Blood Glucose Monitoring Suppl (ACCU-CHEK AVIVA) device 1 each by Other route  daily. Check blood sugar once daily  . Cholecalciferol (VITAMIN D) 125 MCG (5000 UT) CAPS Take 3 capsules by mouth daily.  Marland Kitchen glucose blood (ACCU-CHEK AVIVA PLUS) test strip 1 each by Other route daily.  . Lancets (ACCU-CHEK SOFT TOUCH) lancets 1 each by Other route daily.  . metFORMIN (GLUCOPHAGE) 500 MG tablet TAKE 1 TABLET BY MOUTH EVERY DAY WITH BREAKFAST TO CONTROL SUGAR  . simvastatin (ZOCOR) 20 MG tablet TAKE 1 TABLET BY MOUTH EVERY EVENING TO LOWER CHOLESTROL   No facility-administered encounter medications on file as of 09/24/2019.    Activities of Daily Living In your present state of health, do you have any difficulty performing the following activities: 09/24/2019  Hearing? N  Vision? N  Difficulty concentrating or making decisions? N  Walking or climbing stairs? N  Dressing or bathing? N  Doing errands, shopping? N  Preparing Food and eating ? N  Using the Toilet? N  In the past six months, have you accidently leaked urine? Y  Do you have problems with loss of bowel control? N  Managing your Medications? N  Managing your Finances? N  Housekeeping or managing your Housekeeping? N  Some recent data might be hidden    Patient Care Team: [name], NP as PCP - General (Nurse Practitioner)    Assessment:   This is a routine wellness examination for [place].  Exercise Activities and Dietary recommendations Current Exercise Habits: Home exercise routine, Type of exercise: walking, Time (Minutes): 45, Frequency (Times/Week): 7, Weekly Exercise (Minutes/Week): 315, Intensity: Moderate  Goals    . Patient Stated     To take care of herself and see her great grandchildren    . Stress reliever       I will focus on myself and stress less.       Fall Risk Fall Risk  09/24/2019 09/18/2019 03/20/2019 09/19/2018 09/05/2018  Falls in the past year? 0 0 0 0 0  Number falls in past yr: 0 0 - 0 0  Injury with Fall? 0 0 0 0 0   Is the patient's home free of loose throw rugs  in walkways, pet beds, electrical cords, etc?   yes      Grab bars in the bathroom? yes      Handrails on the stairs?   yes      Adequate lighting?   yes  Timed Get Up and Go performed: na  Depression Screen PHQ 2/9 Scores 09/24/2019 03/20/2019 09/19/2018 09/13/2017  PHQ - 2 Score 0 0 0 0     Cognitive Function MMSE - Mini Mental State Exam 09/13/2017 08/16/2016  Orientation to time 4 5  Orientation to Place 4 5  Registration 3 3  Attention/ Calculation 5 5  Recall  2 2  Language- name 2 objects 2 2  Language- repeat 1 1  Language- follow 3 step command 3 3  Language- read & follow direction 1 1  Write a sentence 1 1  Copy design 1 1  Total score 27 29     6CIT Screen 09/24/2019 09/19/2018  What Year? 0 points 0 points  What month? 0 points 0 points  What time? 0 points 0 points  Count back from 20 0 points 0 points  Months in reverse 0 points 0 points  Repeat phrase 0 points 0 points  Total Score 0 0    Immunization History  Administered Date(s) Administered  . Influenza, High Dose Seasonal PF 01/29/2019  . Influenza, Quadrivalent, Recombinant, Inj, Pf 02/13/2018  . Influenza-Unspecified 02/14/2015  . PFIZER SARS-COV-2 Vaccination 05/29/2019, 06/23/2019  . Pneumococcal Conjugate-13 10/22/2013  . Pneumococcal Polysaccharide-23 08/16/2016, 03/20/2019  . Td 04/24/2009  . Tdap 10/07/2016  . Zoster Recombinat (Shingrix) 10/19/2017, 01/17/2018    Qualifies for Shingles Vaccine?yes  Screening Tests Health Maintenance  Topic Date Due  . FOOT EXAM  03/14/2018  . OPHTHALMOLOGY EXAM  11/14/2019  . INFLUENZA VACCINE  11/15/2019  . HEMOGLOBIN A1C  03/13/2020  . URINE MICROALBUMIN  09/17/2020  . MAMMOGRAM  02/26/2021  . COLONOSCOPY  06/29/2026  . TETANUS/TDAP  10/08/2026  . DEXA SCAN  Completed  . COVID-19 Vaccine  Completed  . Hepatitis C Screening  Completed  . PNA vac Low Risk Adult  Completed    Cancer Screenings: Lung: Low Dose CT Chest recommended if Age 62-80  years, 30 pack-year currently smoking OR have quit w/in 15years. Patient does not qualify. Breast:  Up to date on Mammogram? Yes   Up to date of Bone Density/Dexa? Yes Colorectal: up to date  Additional Screenings:  Hepatitis C Screening: done     Plan:     I have personally reviewed and noted the following in the patient's chart:   . Medical and social history . Use of alcohol, tobacco or illicit drugs  . Current medications and supplements . Functional ability and status . Nutritional status . Physical activity . Advanced directives . List of other physicians . Hospitalizations, surgeries, and ER visits in previous 12 months . Vitals . Screenings to include cognitive, depression, and falls . Referrals and appointments  In addition, I have reviewed and discussed with patient certain preventive protocols, quality metrics, and best practice recommendations. A written personalized care plan for preventive services as well as general preventive health recommendations were provided to patient.     [name], NP  09/24/2019

## 2019-09-24 NOTE — Patient Instructions (Signed)
Jasmine James , Thank you for taking time to come for your Medicare Wellness Visit. I appreciate your ongoing commitment to your health goals. Please review the following plan we discussed and let me know if I can assist you in the future.   Screening recommendations/referrals: Colonoscopy up to date Mammogram up to date Bone Density up to date Recommended yearly ophthalmology/optometry visit for glaucoma screening and checkup Recommended yearly dental visit for hygiene and checkup  Vaccinations: Influenza vaccine DUE in September 2021 Pneumococcal vaccine up to date Tdap vaccine up todate Shingles vaccine up to date  Advanced directives: please bring copy to office so we can place on file. Recommend to complete MOST form  Conditions/risks identified: diabetes, high blood pressure, high cholesterol.   Next appointment: 1 year.    Preventive Care 35 Years and Older, Female Preventive care refers to lifestyle choices and visits with your health care provider that can promote health and wellness. What does preventive care include?  A yearly physical exam. This is also called an annual well check.  Dental exams once or twice a year.  Routine eye exams. Ask your health care provider how often you should have your eyes checked.  Personal lifestyle choices, including:  Daily care of your teeth and gums.  Regular physical activity.  Eating a healthy diet.  Avoiding tobacco and drug use.  Limiting alcohol use.  Practicing safe sex.  Taking low-dose aspirin every day.  Taking vitamin and mineral supplements as recommended by your health care provider. What happens during an annual well check? The services and screenings done by your health care provider during your annual well check will depend on your age, overall health, lifestyle risk factors, and family history of disease. Counseling  Your health care provider may ask you questions about your:  Alcohol use.  Tobacco  use.  Drug use.  Emotional well-being.  Home and relationship well-being.  Sexual activity.  Eating habits.  History of falls.  Memory and ability to understand (cognition).  Work and work Statistician.  Reproductive health. Screening  You may have the following tests or measurements:  Height, weight, and BMI.  Blood pressure.  Lipid and cholesterol levels. These may be checked every 5 years, or more frequently if you are over 59 years old.  Skin check.  Lung cancer screening. You may have this screening every year starting at age 74 if you have a 30-pack-year history of smoking and currently smoke or have quit within the past 15 years.  Fecal occult blood test (FOBT) of the stool. You may have this test every year starting at age 74.  Flexible sigmoidoscopy or colonoscopy. You may have a sigmoidoscopy every 5 years or a colonoscopy every 10 years starting at age 74.  Hepatitis C blood test.  Hepatitis B blood test.  Sexually transmitted disease (STD) testing.  Diabetes screening. This is done by checking your blood sugar (glucose) after you have not eaten for a while (fasting). You may have this done every 1-3 years.  Bone density scan. This is done to screen for osteoporosis. You may have this done starting at age 74.  Mammogram. This may be done every 1-2 years. Talk to your health care provider about how often you should have regular mammograms. Talk with your health care provider about your test results, treatment options, and if necessary, the need for more tests. Vaccines  Your health care provider may recommend certain vaccines, such as:  Influenza vaccine. This is recommended every year.  Tetanus, diphtheria, and acellular pertussis (Tdap, Td) vaccine. You may need a Td booster every 10 years.  Zoster vaccine. You may need this after age 52.  Pneumococcal 13-valent conjugate (PCV13) vaccine. One dose is recommended after age 26.  Pneumococcal  polysaccharide (PPSV23) vaccine. One dose is recommended after age 74. Talk to your health care provider about which screenings and vaccines you need and how often you need them. This information is not intended to replace advice given to you by your health care provider. Make sure you discuss any questions you have with your health care provider. Document Released: 04/29/2015 Document Revised: 12/21/2015 Document Reviewed: 02/01/2015 Elsevier Interactive Patient Education  2017 [place] Prevention in the Home Falls can cause injuries. They can happen to people of all ages. There are many things you can do to make your home safe and to help prevent falls. What can I do on the outside of my home?  Regularly fix the edges of walkways and driveways and fix any cracks.  Remove anything that might make you trip as you walk through a door, such as a raised step or threshold.  Trim any bushes or trees on the path to your home.  Use bright outdoor lighting.  Clear any walking paths of anything that might make someone trip, such as rocks or tools.  Regularly check to see if handrails are loose or broken. Make sure that both sides of any steps have handrails.  Any raised decks and porches should have guardrails on the edges.  Have any leaves, snow, or ice cleared regularly.  Use sand or salt on walking paths during winter.  Clean up any spills in your garage right away. This includes oil or grease spills. What can I do in the bathroom?  Use night lights.  Install grab bars by the toilet and in the tub and shower. Do not use towel bars as grab bars.  Use non-skid mats or decals in the tub or shower.  If you need to sit down in the shower, use a plastic, non-slip stool.  Keep the floor dry. Clean up any water that spills on the floor as soon as it happens.  Remove soap buildup in the tub or shower regularly.  Attach bath mats securely with double-sided non-slip rug  tape.  Do not have throw rugs and other things on the floor that can make you trip. What can I do in the bedroom?  Use night lights.  Make sure that you have a light by your bed that is easy to reach.  Do not use any sheets or blankets that are too big for your bed. They should not hang down onto the floor.  Have a firm chair that has side arms. You can use this for support while you get dressed.  Do not have throw rugs and other things on the floor that can make you trip. What can I do in the kitchen?  Clean up any spills right away.  Avoid walking on wet floors.  Keep items that you use a lot in easy-to-reach places.  If you need to reach something above you, use a strong step stool that has a grab bar.  Keep electrical cords out of the way.  Do not use floor polish or wax that makes floors slippery. If you must use wax, use non-skid floor wax.  Do not have throw rugs and other things on the floor that can make you trip. What can I do  with my stairs?  Do not leave any items on the stairs.  Make sure that there are handrails on both sides of the stairs and use them. Fix handrails that are broken or loose. Make sure that handrails are as long as the stairways.  Check any carpeting to make sure that it is firmly attached to the stairs. Fix any carpet that is loose or worn.  Avoid having throw rugs at the top or bottom of the stairs. If you do have throw rugs, attach them to the floor with carpet tape.  Make sure that you have a light switch at the top of the stairs and the bottom of the stairs. If you do not have them, ask someone to add them for you. What else can I do to help prevent falls?  Wear shoes that:  Do not have high heels.  Have rubber bottoms.  Are comfortable and fit you well.  Are closed at the toe. Do not wear sandals.  If you use a stepladder:  Make sure that it is fully opened. Do not climb a closed stepladder.  Make sure that both sides of the  stepladder are locked into place.  Ask someone to hold it for you, if possible.  Clearly mark and make sure that you can see:  Any grab bars or handrails.  First and last steps.  Where the edge of each step is.  Use tools that help you move around (mobility aids) if they are needed. These include:  Canes.  Walkers.  Scooters.  Crutches.  Turn on the lights when you go into a dark area. Replace any light bulbs as soon as they burn out.  Set up your furniture so you have a clear path. Avoid moving your furniture around.  If any of your floors are uneven, fix them.  If there are any pets around you, be aware of where they are.  Review your medicines with your doctor. Some medicines can make you feel dizzy. This can increase your chance of falling. Ask your doctor what other things that you can do to help prevent falls. This information is not intended to replace advice given to you by your health care provider. Make sure you discuss any questions you have with your health care provider. Document Released: 01/27/2009 Document Revised: 09/08/2015 Document Reviewed: 05/07/2014 Elsevier Interactive Patient Education  2017 [employer].

## 2019-09-24 NOTE — Telephone Encounter (Signed)
Ms. Jasmine James, buechler are scheduled for a virtual visit with your provider today.    Just as we do with appointments in the office, we must obtain your consent to participate.  Your consent will be active for this visit and any virtual visit you may have with one of our providers in the next 365 days.    If you have a MyChart account, I can also send a copy of this consent to you electronically.  All virtual visits are billed to your insurance company just like a traditional visit in the office.  As this is a virtual visit, video technology does not allow for your provider to perform a traditional examination.  This may limit your provider's ability to fully assess your condition.  If your provider identifies any concerns that need to be evaluated in person or the need to arrange testing such as labs, EKG, etc, we will make arrangements to do so.    Although advances in technology are sophisticated, we cannot ensure that it will always work on either your end or our end.  If the connection with a video visit is poor, we may have to switch to a telephone visit.  With either a video or telephone visit, we are not always able to ensure that we have a secure connection.   I need to obtain your verbal consent now.   Are you willing to proceed with your visit today?   LYNEE ROSENBACH has provided verbal consent on 09/24/2019 for a virtual visit (video or telephone).   Carroll Kinds, CMA 09/24/2019  9:03 AM

## 2019-09-24 NOTE — Progress Notes (Signed)
This service is provided via telemedicine  No vital signs collected/recorded due to the encounter was a telemedicine visit.   Location of patient (ex: home, work):  Home  Patient consents to a telephone visit:  Yes, see telephone consent dated 09/24/2019  Location of the provider (ex: office, home):  [place]  Name of any referring provider:  N/A  Names of all persons participating in the telemedicine service and their role in the encounter:  Sherrie Mustache, Nurse Practitioner, Carroll Kinds, CMA, and patient.   Time spent on call:  7 minutes with medical assistant

## 2020-01-11 ENCOUNTER — Other Ambulatory Visit: Payer: Self-pay | Admitting: Internal Medicine

## 2020-01-12 ENCOUNTER — Ambulatory Visit: Payer: Self-pay

## 2020-01-12 ENCOUNTER — Ambulatory Visit: Payer: PPO | Admitting: Orthopaedic Surgery

## 2020-01-12 DIAGNOSIS — M25561 Pain in right knee: Secondary | ICD-10-CM

## 2020-01-12 MED ORDER — METHYLPREDNISOLONE ACETATE 40 MG/ML IJ SUSP
40.0000 mg | INTRAMUSCULAR | Status: AC | PRN
  Administered 2020-01-12: 40 mg via INTRA_ARTICULAR

## 2020-01-12 MED ORDER — BUPIVACAINE HCL 0.25 % IJ SOLN
2.0000 mL | INTRAMUSCULAR | Status: AC | PRN
  Administered 2020-01-12: 2 mL via INTRA_ARTICULAR

## 2020-01-12 MED ORDER — LIDOCAINE HCL 1 % IJ SOLN
2.0000 mL | INTRAMUSCULAR | Status: AC | PRN
  Administered 2020-01-12: 2 mL

## 2020-01-12 NOTE — Progress Notes (Signed)
Office Visit Note   Patient: Jasmine James           Date of Birth: [birth year]           MRN: [id number] Visit Date: 01/12/2020              Requested by: [name], NP [place],  [place] [address] PCP: [name], NP   Assessment & Plan: Visit Diagnoses:  1. Acute pain of right knee     Plan: Impression is right knee medial meniscus tear versus arthritis flareup.  We will inject the right knee with cortisone today.  She will follow up with [place] as needed.  Follow-Up Instructions: Return if symptoms worsen or fail to improve.   Orders:  Orders Placed This Encounter  Procedures  . Large Joint Inj: R knee  . XR KNEE 3 VIEW RIGHT   No orders of the defined types were placed in this encounter.     Procedures: Large Joint Inj: R knee on 01/12/2020 9:51 AM Indications: pain Details: 22 G needle, anterolateral approach Medications: 2 mL lidocaine 1 %; 2 mL bupivacaine 0.25 %; 40 mg methylPREDNISolone acetate 40 MG/ML      Clinical Data: No additional findings.   Subjective: Chief Complaint  Patient presents with  . Right Knee - Pain    HPI patient is a very pleasant 74 year old female who comes in today with an injury to her right knee, yesterday, she was coming down a ladder and missed the left bone when she fell awkwardly twisting her right knee and landing on her right side.  She has had pain and instability to the right knee since.  She comes in today for further evaluation and treat recommendation.  The pain is to the entire knee but most of her complaints are of instability.  No mechanical symptoms.  Ambulating seems to make her symptoms worse.  She has been wearing a brace and using ice and Epson salt with mild relief of symptoms.  Review of Systems as detailed in HPI.  All others reviewed and are negative.   Objective: Vital Signs: There were no vitals taken for this visit.  Physical Exam well-developed well-nourished female  no acute distress.  Alert oriented x3.  Ortho Exam examination of her right knee shows no effusion.  She does have a superficial skin abrasion to the anterolateral aspect.  No signs of infection or cellulitis.  Range of motion 0 to 110 degrees.  Moderate medial joint line tenderness.  No tenderness to the MCL.  She is stable to valgus varus stress.  Negative anterior drawer.  She is neurovascular intact distally.  Specialty Comments:  No specialty comments available.  Imaging: XR KNEE 3 VIEW RIGHT  Result Date: 01/12/2020 X-rays demonstrate moderate degenerative changes the medial and patellofemoral compartments.  Otherwise, no acute fracture or other findings    PMFS History: Patient Active Problem List   Diagnosis Date Noted  . Great toe pain, right 06/26/2018  . Plantar fasciitis of left foot 01/29/2018  . Closed nondisplaced fracture of fifth left metatarsal bone 10/08/2016  . Controlled type 2 diabetes mellitus without complication, without long-term current use of insulin ([place]) 07/21/2015  . Hyperlipidemia LDL goal <70 07/21/2015  . Essential hypertension, benign 07/21/2015  . Osteopenia 10/25/2013  . Muscle pain 07/24/2012   Past Medical History:  Diagnosis Date  . Acute bronchitis   . Anxiety   . Disorder of bone and cartilage, unspecified   .  Elevated blood pressure reading without diagnosis of hypertension   . Myalgia and myositis, unspecified   . Osteoporosis, unspecified   . Other abnormal blood chemistry   . Other and unspecified hyperlipidemia   . Plantar fascial fibromatosis   . Type II or unspecified type diabetes mellitus without mention of complication, not stated as uncontrolled     No family history on file.  Past Surgical History:  Procedure Laterality Date  . BREAST SURGERY  1997   reduction  . EYE SURGERY     Left eye contracts removed   Social History   Occupational History  . Not on file  Tobacco Use  . Smoking status: Never Smoker  .  Smokeless tobacco: Never Used  Vaping Use  . Vaping Use: Never used  Substance and Sexual Activity  . Alcohol use: Yes    Comment: glass wine every third day  . Drug use: No  . Sexual activity: Not Currently

## 2020-01-15 ENCOUNTER — Telehealth: Payer: Self-pay | Admitting: Orthopaedic Surgery

## 2020-01-15 NOTE — Telephone Encounter (Signed)
I didn't offer her anything. Did you Mendel Ryder?

## 2020-01-15 NOTE — Telephone Encounter (Signed)
Pt states Kathlee Nations offered her some antibiotic cream and she would now like to have that sent in to her [facility] pharmacy  [phone]

## 2020-01-18 NOTE — Telephone Encounter (Signed)
Called patient no answer LMOM.   She can get Voltaren Gel Over the counter.

## 2020-01-18 NOTE — Telephone Encounter (Signed)
I think I may have offered voltaren. Can you call in rx or tell her to get otc

## 2020-01-22 ENCOUNTER — Other Ambulatory Visit: Payer: Self-pay

## 2020-01-22 ENCOUNTER — Ambulatory Visit (INDEPENDENT_AMBULATORY_CARE_PROVIDER_SITE_OTHER): Payer: PPO | Admitting: Family

## 2020-01-22 ENCOUNTER — Encounter: Payer: Self-pay | Admitting: Family

## 2020-01-22 VITALS — BP 146/90 | HR 88 | Temp 97.2°F | Resp 20 | Ht 63.0 in | Wt 142.4 lb

## 2020-01-22 DIAGNOSIS — M8589 Other specified disorders of bone density and structure, multiple sites: Secondary | ICD-10-CM | POA: Diagnosis not present

## 2020-01-22 DIAGNOSIS — S50311D Abrasion of right elbow, subsequent encounter: Secondary | ICD-10-CM

## 2020-01-22 DIAGNOSIS — I10 Essential (primary) hypertension: Secondary | ICD-10-CM

## 2020-01-22 DIAGNOSIS — E785 Hyperlipidemia, unspecified: Secondary | ICD-10-CM | POA: Diagnosis not present

## 2020-01-22 DIAGNOSIS — S80211A Abrasion, right knee, initial encounter: Secondary | ICD-10-CM

## 2020-01-22 DIAGNOSIS — E119 Type 2 diabetes mellitus without complications: Secondary | ICD-10-CM | POA: Diagnosis not present

## 2020-01-22 NOTE — Progress Notes (Signed)
Provider: Alessandra Sawdey FNP-C   [name], NP  Patient Care Team: [name], NP as PCP - General (Nurse Practitioner)  Extended Emergency Contact Information Primary Emergency Contact: Aura Fey Address: [address]          [place]  [place] Home Phone: [phone] Relation: Spouse Secondary Emergency Contact: Curlene Labrum Address: [address]          [place], [place] [address] Johnnette Litter of [place] Phone: [phone] Relation: Son  Code Status:  Full Code  Goals of care: Advanced Directive information Advanced Directives 01/22/2020  Does Patient Have a Medical Advance Directive? Yes  Type of Advance Directive -  Does patient want to make changes to medical advance directive? -  Copy of [place] in Chart? -  Would patient like information on creating a medical advance directive? -     Chief Complaint  Patient presents with  . Medical Management of Chronic Issues    4 Month Follow Upj    HPI:  Pt is a 73 y.o. female seen today for medical management of chronic diseases. She denies any acute issues today.she states was climbing a ladder at working to get a chart when she missed a step getting down.she landed on her right knee and slid on the floor supported her head with right elbow.she sustained a abrasion on her right elbow and right knee which has scabbed without any signs of infection. Her right knee has a tendency to " give out" she was seen Orthopedic Dr.Xu [place] 01/12/2020 had X-ray which showed moderate degenerative changes the medial and patellofemoral compartments  Otherwise no acute fracture or dislocation noted.  Hypertension - B/P 140/90 today her home readings are in the 130's/70's -140's/80's.Asymptomatic.states has increased salt intake in the food but will try to purchase low sodium foods.Hearing to grocery shopping after visit.   Type 2 DM - no CBG for review.Latest Hgb A1C was within normal range 5.6 on  metformin 500 mg tablet daily.Serum Glucose 107 urine microalbumin indicated no microalbuminuria. She has upcoming appointment with Ophthalmology in November sees [place].states no changes in her vision.   Osteopenia / Vitamin D deficiency - on Vitamin D 15 000 units daily.Not on any calcium supplement tends to make her constipated. Her latest Dexa scan done 02/27/2019 at [facility] showed Osteopenia at AP spine -1.5; left femur Neck -1.7; and right Femur Neck -1.5 was advised to follow up in 2 years.    Has had Influenza vaccine at work will update [place] on administration date.   Past Medical History:  Diagnosis Date  . Acute bronchitis   . Anxiety   . Disorder of bone and cartilage, unspecified   . Elevated blood pressure reading without diagnosis of hypertension   . Myalgia and myositis, unspecified   . Osteoporosis, unspecified   . Other abnormal blood chemistry   . Other and unspecified hyperlipidemia   . Plantar fascial fibromatosis   . Type II or unspecified type diabetes mellitus without mention of complication, not stated as uncontrolled    Past Surgical History:  Procedure Laterality Date  . BREAST SURGERY  1997   reduction  . EYE SURGERY     Left eye contracts removed    Allergies  Allergen Reactions  . Codeine   . Sulfa Antibiotics     Allergies as of 01/22/2020      Reactions   Codeine    Sulfa Antibiotics       Medication  List       Accurate as of January 22, 2020  9:56 AM. If you have any questions, ask your nurse or doctor.        Accu-Chek Aviva device 1 each by Other route daily. Check blood sugar once daily   Accu-Chek Aviva Plus test strip Generic drug: glucose blood 1 each by Other route daily.   accu-chek soft touch lancets 1 each by Other route daily.   metFORMIN 500 MG tablet Commonly known as: GLUCOPHAGE TAKE 1 TABLET BY MOUTH EVERY DAY WITH BREAKFAST TO CONTROL SUGAR   simvastatin 20 MG tablet Commonly known as:  ZOCOR TAKE 1 TABLET BY MOUTH AT BEDTIME TO LOWER CHOLESTROL   Tylenol 8 Hour Arthritis Pain 650 MG CR tablet Generic drug: acetaminophen Take 1,300 mg by mouth daily as needed for pain.   Vitamin D 125 MCG (5000 UT) Caps Take 3 capsules by mouth daily.       Review of Systems  Constitutional: Negative for appetite change, chills, fatigue and fever.  HENT: Negative for congestion, hearing loss, postnasal drip, rhinorrhea, sinus pressure, sinus pain, sneezing, sore throat and trouble swallowing.   Eyes: Negative for discharge, redness and itching.  Respiratory: Negative for cough, chest tightness, shortness of breath and wheezing.   Cardiovascular: Negative for chest pain, palpitations and leg swelling.  Gastrointestinal: Negative for abdominal distention, abdominal pain, constipation, diarrhea, nausea and vomiting.  Endocrine: Negative for cold intolerance, heat intolerance, polydipsia, polyphagia and polyuria.  Genitourinary: Negative for dyspareunia, dysuria, flank pain, frequency and urgency.  Musculoskeletal: Negative for arthralgias, back pain, gait problem, joint swelling, myalgias and neck pain.       Right knee feels like knee is going to give out   Skin: Negative for color change, pallor and rash.  Neurological: Negative for dizziness, speech difficulty, weakness, light-headedness, numbness and headaches.  Hematological: Does not bruise/bleed easily.  Psychiatric/Behavioral: Negative for agitation, behavioral problems, decreased concentration and sleep disturbance. The patient is not nervous/anxious.        8-9 hrs sleep     Immunization History  Administered Date(s) Administered  . Influenza, High Dose Seasonal PF 01/29/2019  . Influenza, Quadrivalent, Recombinant, Inj, Pf 02/13/2018  . Influenza-Unspecified 02/14/2015  . PFIZER SARS-COV-2 Vaccination 05/29/2019, 06/23/2019  . Pneumococcal Conjugate-13 10/22/2013  . Pneumococcal Polysaccharide-23 08/16/2016, 03/20/2019   . Td 04/24/2009  . Tdap 10/07/2016  . Zoster Recombinat (Shingrix) 10/19/2017, 01/17/2018   Pertinent  Health Maintenance Due  Topic Date Due  . FOOT EXAM  03/14/2018  . OPHTHALMOLOGY EXAM  11/14/2019  . INFLUENZA VACCINE  11/15/2019  . HEMOGLOBIN A1C  03/13/2020  . URINE MICROALBUMIN  09/17/2020  . MAMMOGRAM  02/26/2021  . COLONOSCOPY  06/29/2026  . DEXA SCAN  Completed  . PNA vac Low Risk Adult  Completed   Fall Risk  01/22/2020 09/24/2019 09/18/2019 03/20/2019 09/19/2018  Falls in the past year? 1 0 0 0 0  Number falls in past yr: 0 0 0 - 0  Injury with Fall? 1 0 0 0 0    Vitals:   01/22/20 0932  BP: (!) 146/90  Pulse: 88  Resp: 20  Temp: (!) 97.2 F (36.2 C)  SpO2: 96%  Weight: 142 lb 6.4 oz (64.6 kg)  Height: 5' 3"  (1.6 m)   Body mass index is 25.23 kg/m. Physical Exam Vitals reviewed.  Constitutional:      General: She is not in acute distress.    Appearance: She is not ill-appearing.  HENT:  Head: Normocephalic.     Right Ear: Tympanic membrane, ear canal and external ear normal. There is no impacted cerumen.     Left Ear: Tympanic membrane, ear canal and external ear normal. There is no impacted cerumen.  Eyes:     General: No scleral icterus.       Right eye: No discharge.        Left eye: No discharge.     Extraocular Movements: Extraocular movements intact.     Conjunctiva/sclera: Conjunctivae normal.     Pupils: Pupils are equal, round, and reactive to light.  Neck:     Vascular: No carotid bruit.  Cardiovascular:     Rate and Rhythm: Normal rate and regular rhythm.     Pulses: Normal pulses.     Heart sounds: Normal heart sounds. No murmur heard.  No friction rub. No gallop.   Pulmonary:     Effort: Pulmonary effort is normal. No respiratory distress.     Breath sounds: Normal breath sounds. No wheezing, rhonchi or rales.  Chest:     Chest wall: No tenderness.  Abdominal:     General: Bowel sounds are normal. There is no distension.      Palpations: Abdomen is soft. There is no mass.     Tenderness: There is no abdominal tenderness. There is no right CVA tenderness, left CVA tenderness, guarding or rebound.  Musculoskeletal:        General: No swelling or tenderness. Normal range of motion.     Cervical back: Normal range of motion. No rigidity or tenderness.     Right lower leg: No edema.     Left lower leg: No edema.  Lymphadenopathy:     Cervical: No cervical adenopathy.  Skin:    General: Skin is warm and dry.     Coloration: Skin is not pale.     Findings: No bruising, erythema or rash.     Comments: Right knee and right elbow abrasion with scab without any erythema or drainage.none-tender to touch.   Neurological:     Mental Status: She is alert and oriented to person, place, and time.     Cranial Nerves: No cranial nerve deficit.     Sensory: No sensory deficit.     Motor: No weakness.     Coordination: Coordination normal.     Gait: Gait normal.  Psychiatric:        Mood and Affect: Mood normal.        Behavior: Behavior normal.        Thought Content: Thought content normal.        Judgment: Judgment normal.    Labs reviewed: Recent Labs    03/06/19 0857 09/11/19 0807  NA 140 139  K 3.9 4.0  CL 104 105  CO2 24 23  GLUCOSE 103* 107*  BUN 17 14  CREATININE 0.73 0.74  CALCIUM 9.6 9.8   Recent Labs    03/06/19 0857 09/11/19 0807  AST 25 22  ALT 24 19  BILITOT 0.5 0.5  PROT 7.5 7.6   Recent Labs    03/06/19 0857 09/11/19 0807  WBC 5.6 5.5  NEUTROABS 2,604 2,118  HGB 13.4 13.9  HCT 40.4 41.8  MCV 89.0 87.3  PLT 284 273   Lab Results  Component Value Date   TSH 2.31 09/11/2019   Lab Results  Component Value Date   HGBA1C 5.6 09/11/2019   Lab Results  Component Value Date   CHOL 151 09/11/2019  HDL 42 (L) 09/11/2019   LDLCALC 88 09/11/2019   TRIG 110 09/11/2019   CHOLHDL 3.6 09/11/2019    Significant Diagnostic Results in last 30 days:  XR KNEE 3 VIEW RIGHT  Result  Date: 01/12/2020 X-rays demonstrate moderate degenerative changes the medial and patellofemoral compartments.  Otherwise, no acute fracture or other findings   Assessment/Plan 1. Controlled type 2 diabetes mellitus without complication, without long-term current use of insulin (HCC) Lab Results  Component Value Date   HGBA1C 5.6 09/11/2019  CBG well controlled.No symptoms of hypoglycemia. Continue on metformin 500 mg tablet daily then notify provider for CBG< or = 75 will reduce metformin to 250 if having low CBG's. Has upcoming appointment for Ophthalmology. Monofilament prick test intact today. - continue on statin not on ASA - Hemoglobin A1c; Future  2. Essential hypertension, benign B/p not at goal this visit.Emphasized cutting down on salt intake.continue to monitor B/p at home and Notify provider if B/p > 140/90  Will add low dose losartan if B/p still not at goal - CBC with Differential/Platelet; Future - CMP with eGFR([facility]); Future - TSH; Future  3. Hyperlipidemia LDL goal <70 LDL not at goal 88 - continue on  - Lipid panel; Future  4. Osteopenia of multiple sites Latest bone density in chart reviewed up to date done by [facility] in [place].Due 2022. - continue on vitamin D supplement - latest vitamin D level 57   5. Right knee abrasion  Afebrile. scab without any erythema or drainage.none-tender to touch.Progressive healing.  - continue to monitor for signs of infection.  6. Right elbow abrasion  Afebrile.scab without any erythema or drainage.none-tender to touch.healing well. - continue to monitor for signs of infection.   Family/ staff Communication: Reviewed plan of care with patient  Labs/tests ordered:  - CBC with Differential/Platelet; Future - CMP with eGFR([facility]); Future - TSH; Future - Lipid panel; Future - Hemoglobin A1c; Future  Next Appointment : 6 months for medical management of chronic issues.Fasting labs in 1-2 weeks or sooner.    Sandrea Hughs, NP

## 2020-01-26 ENCOUNTER — Ambulatory Visit: Payer: PPO | Admitting: Orthopaedic Surgery

## 2020-01-26 ENCOUNTER — Encounter: Payer: Self-pay | Admitting: Orthopaedic Surgery

## 2020-01-26 ENCOUNTER — Telehealth: Payer: Self-pay

## 2020-01-26 DIAGNOSIS — M25561 Pain in right knee: Secondary | ICD-10-CM | POA: Diagnosis not present

## 2020-01-26 MED ORDER — MUPIROCIN 2 % EX OINT: TOPICAL_OINTMENT | CUTANEOUS | 0 refills | Status: DC

## 2020-01-26 NOTE — Progress Notes (Signed)
Office Visit Note   Patient: Jasmine James           Date of Birth: Apr 12, 1946           MRN: [id number] Visit Date: 01/26/2020              Requested by: [name], NP [place],  [place] [id number] PCP: [name], NP   Assessment & Plan: Visit Diagnoses:  1. Acute pain of right knee     Plan: Impression is improving right knee pain from questionable arthritis flareup versus degenerative medial meniscus tear. The patient has improved quite a bit following cortisone injection so we will obtain viscosupplementation injection approval for now. She will follow up with [place] once she is approved and would like to proceed. I will call in mupirocin ointment to put over the superficial abrasion as needed. She will follow up with [place] as needed.  Follow-Up Instructions: Return if symptoms worsen or fail to improve.   Orders:  No orders of the defined types were placed in this encounter.  Meds ordered this encounter  Medications  . mupirocin ointment (BACTROBAN) 2 %    Sig: Apply to affected area 2 times daily prn    Dispense:  22 g    Refill:  0      Procedures: No procedures performed   Clinical Data: No additional findings.   Subjective: Chief Complaint  Patient presents with  . Right Knee - Pain    HPI [name] is a very pleasant 74 year old female who comes in today for follow-up of her right knee injury. This occurred a few weeks ago while at work after falling off a ladder. She was seen in our office where x-rays were obtained. These were negative for fracture. She had medial joint line pain at the time which has significantly improved following cortisone injection from that appointment. She still has mild pain to the anteromedial knee. She is taking Tylenol arthritis as needed. She also notes continued superficial abrasion to the anterior knee for which she is concerned for infection.  Review of Systems as detailed in HPI. All others reviewed  and are negative.   Objective: Vital Signs: There were no vitals taken for this visit.  Physical Exam well-developed well-nourished female no acute distress. Alert oriented x3.  Ortho Exam left knee exam shows no effusion. Full range of motion. No joint line tenderness. She has mild tenderness over the pes bursa. She has a small superficial abrasion to the anterior knee. No drainage, warmth, erythema or any other signs of infection or cellulitis. She is neurovascular intact distally.  Specialty Comments:  No specialty comments available.  Imaging: No new imaging   PMFS History: Patient Active Problem List   Diagnosis Date Noted  . Great toe pain, right 06/26/2018  . Plantar fasciitis of left foot 01/29/2018  . Closed nondisplaced fracture of fifth left metatarsal bone 10/08/2016  . Controlled type 2 diabetes mellitus without complication, without long-term current use of insulin ([place]) 07/21/2015  . Hyperlipidemia LDL goal <70 07/21/2015  . Essential hypertension, benign 07/21/2015  . Osteopenia 10/25/2013  . Muscle pain 07/24/2012   Past Medical History:  Diagnosis Date  . Acute bronchitis   . Anxiety   . Disorder of bone and cartilage, unspecified   . Elevated blood pressure reading without diagnosis of hypertension   . Myalgia and myositis, unspecified   . Osteoporosis, unspecified   . Other abnormal blood chemistry   .  Other and unspecified hyperlipidemia   . Plantar fascial fibromatosis   . Type II or unspecified type diabetes mellitus without mention of complication, not stated as uncontrolled     History reviewed. No pertinent family history.  Past Surgical History:  Procedure Laterality Date  . BREAST SURGERY  1997   reduction  . EYE SURGERY     Left eye contracts removed   Social History   Occupational History  . Not on file  Tobacco Use  . Smoking status: Never Smoker  . Smokeless tobacco: Never Used  Vaping Use  . Vaping Use: Never used  Substance  and Sexual Activity  . Alcohol use: Yes    Comment: glass wine every third day  . Drug use: No  . Sexual activity: Not Currently

## 2020-01-26 NOTE — Telephone Encounter (Signed)
Noted  

## 2020-01-26 NOTE — Telephone Encounter (Signed)
Please precert for right knee gel injections. This is Dr.Xu's patient.  Thanks!

## 2020-01-29 ENCOUNTER — Other Ambulatory Visit: Payer: Self-pay

## 2020-01-29 ENCOUNTER — Other Ambulatory Visit: Payer: PPO

## 2020-01-29 DIAGNOSIS — I10 Essential (primary) hypertension: Secondary | ICD-10-CM

## 2020-01-29 DIAGNOSIS — E119 Type 2 diabetes mellitus without complications: Secondary | ICD-10-CM | POA: Diagnosis not present

## 2020-01-29 DIAGNOSIS — E785 Hyperlipidemia, unspecified: Secondary | ICD-10-CM

## 2020-01-30 LAB — COMPLETE METABOLIC PANEL WITH GFR
AG Ratio: 1.7 (calc) (ref 1.0–2.5)
ALT: 16 U/L (ref 6–29)
AST: 21 U/L (ref 10–35)
Albumin: 4.7 g/dL (ref 3.6–5.1)
Alkaline phosphatase (APISO): 97 U/L (ref 37–153)
BUN: 18 mg/dL (ref 7–25)
CO2: 24 mmol/L (ref 20–32)
Calcium: 10.2 mg/dL (ref 8.6–10.4)
Chloride: 104 mmol/L (ref 98–110)
Creat: 0.73 mg/dL (ref 0.60–0.93)
GFR, Est African American: 95 mL/min/{1.73_m2} (ref >=60)
GFR, Est Non African American: 82 mL/min/{1.73_m2} (ref >=60)
Globulin: 2.8 g/dL (calc) (ref 1.9–3.7)
Glucose, Bld: 96 mg/dL (ref 65–99)
Potassium: 4.3 mmol/L (ref 3.5–5.3)
Sodium: 139 mmol/L (ref 135–146)
Total Bilirubin: 0.5 mg/dL (ref 0.2–1.2)
Total Protein: 7.5 g/dL (ref 6.1–8.1)

## 2020-01-30 LAB — CBC WITH DIFFERENTIAL/PLATELET
Absolute Monocytes: 485 cells/uL (ref 200–950)
Basophils Absolute: 60 cells/uL (ref 0–200)
Basophils Relative: 1.2 %
Eosinophils Absolute: 120 cells/uL (ref 15–500)
Eosinophils Relative: 2.4 %
HCT: 39.9 % (ref 35.0–45.0)
Hemoglobin: 13.3 g/dL (ref 11.7–15.5)
Lymphs Abs: 2270 cells/uL (ref 850–3900)
MCH: 29 pg (ref 27.0–33.0)
MCHC: 33.3 g/dL (ref 32.0–36.0)
MCV: 87.1 fL (ref 80.0–100.0)
MPV: 9.3 fL (ref 7.5–12.5)
Monocytes Relative: 9.7 %
Neutro Abs: 2065 cells/uL (ref 1500–7800)
Neutrophils Relative %: 41.3 %
Platelets: 302 10*3/uL (ref 140–400)
RBC: 4.58 10*6/uL (ref 3.80–5.10)
RDW: 12.6 % (ref 11.0–15.0)
Total Lymphocyte: 45.4 %
WBC: 5 10*3/uL (ref 3.8–10.8)

## 2020-01-30 LAB — HEMOGLOBIN A1C
Hgb A1c MFr Bld: 6 % of total Hgb — ABNORMAL HIGH (ref <5.7)
Mean Plasma Glucose: 126 (calc)
eAG (mmol/L): 7 (calc)

## 2020-01-30 LAB — LIPID PANEL
Cholesterol: 146 mg/dL (ref <200)
HDL: 45 mg/dL — ABNORMAL LOW (ref >=50)
LDL Cholesterol (Calc): 82 mg/dL (calc)
Non-HDL Cholesterol (Calc): 101 mg/dL (calc) (ref <130)
Total CHOL/HDL Ratio: 3.2 (calc) (ref <5.0)
Triglycerides: 91 mg/dL (ref <150)

## 2020-01-30 LAB — TSH: TSH: 1.71 mIU/L (ref 0.40–4.50)

## 2020-02-07 ENCOUNTER — Other Ambulatory Visit: Payer: Self-pay | Admitting: Internal Medicine

## 2020-02-15 ENCOUNTER — Telehealth: Payer: Self-pay

## 2020-02-15 NOTE — Telephone Encounter (Signed)
Submitted VOB, Monovisc, right knee. 

## 2020-03-18 ENCOUNTER — Telehealth: Payer: Self-pay

## 2020-03-18 NOTE — Telephone Encounter (Signed)
Lvm for pt to call back to schedule °

## 2020-03-18 NOTE — Telephone Encounter (Signed)
Approved for Monovisc-Right knee Dr. Frederik Pear and [facility] No copay 20% OOP No prior auth required  [facility] to schedule @ next available

## 2020-03-24 DIAGNOSIS — E119 Type 2 diabetes mellitus without complications: Secondary | ICD-10-CM | POA: Diagnosis not present

## 2020-03-24 DIAGNOSIS — Z961 Presence of intraocular lens: Secondary | ICD-10-CM | POA: Diagnosis not present

## 2020-03-24 DIAGNOSIS — H26492 Other secondary cataract, left eye: Secondary | ICD-10-CM | POA: Diagnosis not present

## 2020-03-24 DIAGNOSIS — H18413 Arcus senilis, bilateral: Secondary | ICD-10-CM | POA: Diagnosis not present

## 2020-03-24 DIAGNOSIS — H26493 Other secondary cataract, bilateral: Secondary | ICD-10-CM | POA: Diagnosis not present

## 2020-03-31 DIAGNOSIS — H52223 Regular astigmatism, bilateral: Secondary | ICD-10-CM | POA: Diagnosis not present

## 2020-03-31 DIAGNOSIS — H5203 Hypermetropia, bilateral: Secondary | ICD-10-CM | POA: Diagnosis not present

## 2020-03-31 DIAGNOSIS — Z961 Presence of intraocular lens: Secondary | ICD-10-CM | POA: Diagnosis not present

## 2020-03-31 DIAGNOSIS — H524 Presbyopia: Secondary | ICD-10-CM | POA: Diagnosis not present

## 2020-03-31 DIAGNOSIS — Z9849 Cataract extraction status, unspecified eye: Secondary | ICD-10-CM | POA: Diagnosis not present

## 2020-04-07 DIAGNOSIS — Z01419 Encounter for gynecological examination (general) (routine) without abnormal findings: Secondary | ICD-10-CM | POA: Diagnosis not present

## 2020-04-07 DIAGNOSIS — Z6826 Body mass index (BMI) 26.0-26.9, adult: Secondary | ICD-10-CM | POA: Diagnosis not present

## 2020-04-07 DIAGNOSIS — Z1231 Encounter for screening mammogram for malignant neoplasm of breast: Secondary | ICD-10-CM | POA: Diagnosis not present

## 2020-05-03 DIAGNOSIS — L300 Nummular dermatitis: Secondary | ICD-10-CM | POA: Diagnosis not present

## 2020-05-03 DIAGNOSIS — L239 Allergic contact dermatitis, unspecified cause: Secondary | ICD-10-CM | POA: Diagnosis not present

## 2020-05-28 IMAGING — DX DG TOE GREAT 2+V*R*
3 series · 3 of 3 positions shown · non-contrast
Comparison: None.

CLINICAL DATA: Stubbed great toe week ago.

EXAM:
RIGHT GREAT TOE

[toe ap]
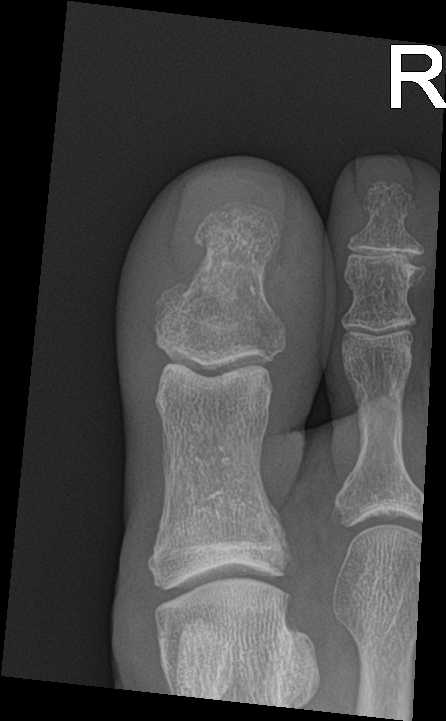

[toe obl]
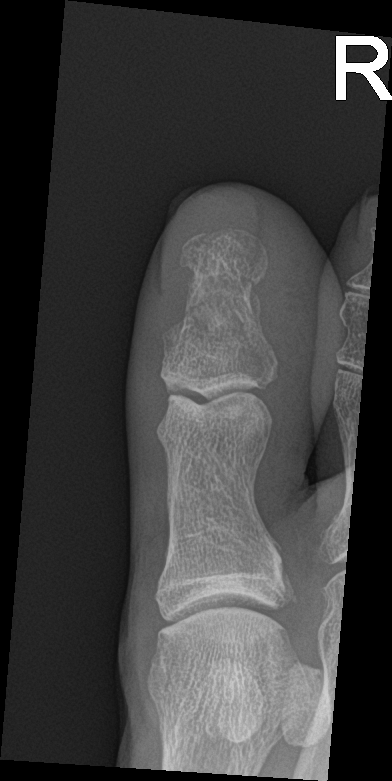

[toe lat]
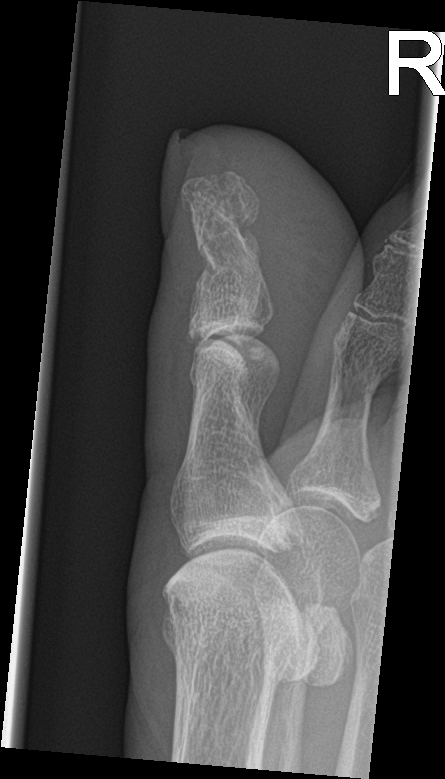

[3 of 3 positions shown; findings below may reference images not displayed]

FINDINGS: Nondisplaced fracture of the mid first distal phalanx and tuft. Mild
osteoarthritis of the second DIP joint. Remaining joint spaces are
preserved. Bone mineralization is normal. Mild soft tissue swelling
of the great toe.
IMPRESSION: 1. Nondisplaced fracture of the mid first distal phalanx and tuft.

## 2020-06-02 DIAGNOSIS — L218 Other seborrheic dermatitis: Secondary | ICD-10-CM | POA: Diagnosis not present

## 2020-07-17 ENCOUNTER — Other Ambulatory Visit: Payer: Self-pay | Admitting: Internal Medicine

## 2020-07-21 ENCOUNTER — Other Ambulatory Visit: Payer: Self-pay | Admitting: Family

## 2020-07-21 DIAGNOSIS — E119 Type 2 diabetes mellitus without complications: Secondary | ICD-10-CM

## 2020-07-21 DIAGNOSIS — E785 Hyperlipidemia, unspecified: Secondary | ICD-10-CM

## 2020-07-21 DIAGNOSIS — I10 Essential (primary) hypertension: Secondary | ICD-10-CM

## 2020-07-22 ENCOUNTER — Other Ambulatory Visit: Payer: PPO

## 2020-07-22 ENCOUNTER — Other Ambulatory Visit: Payer: Self-pay

## 2020-07-22 DIAGNOSIS — I10 Essential (primary) hypertension: Secondary | ICD-10-CM | POA: Diagnosis not present

## 2020-07-22 DIAGNOSIS — E119 Type 2 diabetes mellitus without complications: Secondary | ICD-10-CM

## 2020-07-22 DIAGNOSIS — E785 Hyperlipidemia, unspecified: Secondary | ICD-10-CM

## 2020-07-23 LAB — COMPLETE METABOLIC PANEL WITH GFR
AG Ratio: 1.9 (calc) (ref 1.0–2.5)
ALT: 25 U/L (ref 6–29)
AST: 27 U/L (ref 10–35)
Albumin: 4.8 g/dL (ref 3.6–5.1)
Alkaline phosphatase (APISO): 95 U/L (ref 37–153)
BUN: 17 mg/dL (ref 7–25)
CO2: 24 mmol/L (ref 20–32)
Calcium: 9.7 mg/dL (ref 8.6–10.4)
Chloride: 105 mmol/L (ref 98–110)
Creat: 0.68 mg/dL (ref 0.60–0.93)
GFR, Est African American: 100 mL/min/{1.73_m2} (ref >=60)
GFR, Est Non African American: 86 mL/min/{1.73_m2} (ref >=60)
Globulin: 2.5 g/dL (calc) (ref 1.9–3.7)
Glucose, Bld: 104 mg/dL — ABNORMAL HIGH (ref 65–99)
Potassium: 4.1 mmol/L (ref 3.5–5.3)
Sodium: 139 mmol/L (ref 135–146)
Total Bilirubin: 0.6 mg/dL (ref 0.2–1.2)
Total Protein: 7.3 g/dL (ref 6.1–8.1)

## 2020-07-23 LAB — CBC WITH DIFFERENTIAL/PLATELET
Absolute Monocytes: 547 cells/uL (ref 200–950)
Basophils Absolute: 60 cells/uL (ref 0–200)
Basophils Relative: 1.3 %
Eosinophils Absolute: 170 cells/uL (ref 15–500)
Eosinophils Relative: 3.7 %
HCT: 38.5 % (ref 35.0–45.0)
Hemoglobin: 12.8 g/dL (ref 11.7–15.5)
Lymphs Abs: 1426 cells/uL (ref 850–3900)
MCH: 29 pg (ref 27.0–33.0)
MCHC: 33.2 g/dL (ref 32.0–36.0)
MCV: 87.1 fL (ref 80.0–100.0)
MPV: 9.3 fL (ref 7.5–12.5)
Monocytes Relative: 11.9 %
Neutro Abs: 2397 cells/uL (ref 1500–7800)
Neutrophils Relative %: 52.1 %
Platelets: 265 10*3/uL (ref 140–400)
RBC: 4.42 10*6/uL (ref 3.80–5.10)
RDW: 13.4 % (ref 11.0–15.0)
Total Lymphocyte: 31 %
WBC: 4.6 10*3/uL (ref 3.8–10.8)

## 2020-07-23 LAB — HEMOGLOBIN A1C
Hgb A1c MFr Bld: 6 % of total Hgb — ABNORMAL HIGH (ref <5.7)
Mean Plasma Glucose: 126 mg/dL
eAG (mmol/L): 7 mmol/L

## 2020-07-23 LAB — LIPID PANEL
Cholesterol: 143 mg/dL (ref <200)
HDL: 42 mg/dL — ABNORMAL LOW (ref >=50)
LDL Cholesterol (Calc): 80 mg/dL (calc)
Non-HDL Cholesterol (Calc): 101 mg/dL (calc) (ref <130)
Total CHOL/HDL Ratio: 3.4 (calc) (ref <5.0)
Triglycerides: 115 mg/dL (ref <150)

## 2020-07-23 LAB — TSH: TSH: 1.65 mIU/L (ref 0.40–4.50)

## 2020-07-25 ENCOUNTER — Ambulatory Visit: Payer: PPO | Admitting: Nurse Practitioner

## 2020-07-29 ENCOUNTER — Ambulatory Visit: Payer: PPO | Admitting: Nurse Practitioner

## 2020-08-05 ENCOUNTER — Encounter: Payer: Self-pay | Admitting: Nurse Practitioner

## 2020-08-05 ENCOUNTER — Other Ambulatory Visit: Payer: Self-pay

## 2020-08-05 ENCOUNTER — Ambulatory Visit (INDEPENDENT_AMBULATORY_CARE_PROVIDER_SITE_OTHER): Payer: PPO | Admitting: Nurse Practitioner

## 2020-08-05 VITALS — BP 128/88 | HR 84 | Temp 97.1°F | Ht 63.0 in | Wt 146.0 lb

## 2020-08-05 DIAGNOSIS — I10 Essential (primary) hypertension: Secondary | ICD-10-CM | POA: Diagnosis not present

## 2020-08-05 DIAGNOSIS — M8580 Other specified disorders of bone density and structure, unspecified site: Secondary | ICD-10-CM

## 2020-08-05 DIAGNOSIS — E119 Type 2 diabetes mellitus without complications: Secondary | ICD-10-CM

## 2020-08-05 DIAGNOSIS — E785 Hyperlipidemia, unspecified: Secondary | ICD-10-CM

## 2020-08-05 DIAGNOSIS — M1990 Unspecified osteoarthritis, unspecified site: Secondary | ICD-10-CM | POA: Diagnosis not present

## 2020-08-05 MED ORDER — METFORMIN HCL 500 MG PO TABS: ORAL_TABLET | ORAL | 1 refills | Status: DC

## 2020-08-05 MED ORDER — SIMVASTATIN 20 MG PO TABS: 20.0000 mg | ORAL_TABLET | Freq: Every day | ORAL | 1 refills | Status: DC

## 2020-08-05 NOTE — Progress Notes (Signed)
Careteam: Patient Care Team: [name], NP as PCP - General (Nurse Practitioner)  PLACE OF SERVICE:  [place] Directive information Does Patient Have a Medical Advance Directive?: Yes, Type of Advance Directive: Living will, Does patient want to make changes to medical advance directive?: No - Patient declined  Allergies  Allergen Reactions  . Codeine   . Sulfa Antibiotics     Chief Complaint  Patient presents with  . Medical Management of Chronic Issues    6 month follow-up, refill Zocor an metformin 90 day supply. Patient requested to be excluded from covid vaccine      HPI: Patient is a 75 y.o. female for routine follow up.   DM- controlled on metformin, no hypoglycemia.   Hyperlipidemia- goal <70, trending down on zocor 20 mg daily   Osteopenia- due for bone density dec 22, continues on vit d and cal  Walks for exercise. Attempts to eat right.  Bough a lounge chair to help core. Review of Systems:  Review of Systems  Constitutional: Negative for chills, fever and weight loss.  HENT: Negative for tinnitus.   Respiratory: Negative for cough, sputum production and shortness of breath.   Cardiovascular: Negative for chest pain, palpitations and leg swelling.  Gastrointestinal: Negative for abdominal pain, constipation, diarrhea and heartburn.  Genitourinary: Negative for dysuria, frequency and urgency.  Musculoskeletal: Negative for back pain, falls, joint pain and myalgias.  Skin: Negative.   Neurological: Negative for dizziness and headaches.  Psychiatric/Behavioral: Negative for depression and memory loss. The patient does not have insomnia.     Past Medical History:  Diagnosis Date  . Acute bronchitis   . Anxiety   . Disorder of bone and cartilage, unspecified   . Elevated blood pressure reading without diagnosis of hypertension   . Myalgia and myositis, unspecified   . Osteoporosis, unspecified   . Other abnormal blood chemistry    . Other and unspecified hyperlipidemia   . Plantar fascial fibromatosis   . Type II or unspecified type diabetes mellitus without mention of complication, not stated as uncontrolled    Past Surgical History:  Procedure Laterality Date  . BREAST SURGERY  1997   reduction  . EYE SURGERY     Left eye contracts removed   Social History:   reports that she has never smoked. She has never used smokeless tobacco. She reports current alcohol use. She reports that she does not use drugs.  History reviewed. No pertinent family history.  Medications: Patient's Medications  New Prescriptions   No medications on file  Previous Medications   ACETAMINOPHEN (TYLENOL) 650 MG CR TABLET    Take 1,300 mg by mouth daily as needed for pain.   BLOOD GLUCOSE MONITORING SUPPL (ACCU-CHEK AVIVA) DEVICE    1 each by Other route daily. Check blood sugar once daily   CHOLECALCIFEROL (VITAMIN D) 125 MCG (5000 UT) CAPS    Take 3 capsules by mouth daily.   GLUCOSE BLOOD (ACCU-CHEK AVIVA PLUS) TEST STRIP    1 each by Other route daily.   LANCETS (ACCU-CHEK SOFT TOUCH) LANCETS    1 each by Other route daily.   METFORMIN (GLUCOPHAGE) 500 MG TABLET    TAKE 1 TABLE BY MOUTH EVERY DAY WITH BREAKFAST TO CONTROL SUGAR   MUPIROCIN OINTMENT (BACTROBAN) 2 %    Apply to affected area 2 times daily prn   SIMVASTATIN (ZOCOR) 20 MG TABLET    TAKE 1 TABLET BY MOUTH AT BEDTIME TO  LOWER CHOLESTROL  Modified Medications   No medications on file  Discontinued Medications   No medications on file    Physical Exam:  Vitals:   08/05/20 0837  BP: 128/88  Pulse: 84  Temp: (!) 97.1 F (36.2 C)  TempSrc: Temporal  SpO2: 97%  Weight: 146 lb (66.2 kg)  Height: 5\' 3"  (1.6 m)   Body mass index is 25.23 kg/m. Wt Readings from Last 3 Encounters:  01/22/20 142 lb 6.4 oz (64.6 kg)  09/18/19 145 lb 12.8 oz (66.1 kg)  03/20/19 147 lb 12.8 oz (67 kg)    Physical Exam Constitutional:      General: She is not in acute  distress.    Appearance: She is well-developed. She is not diaphoretic.  HENT:     Head: Normocephalic and atraumatic.     Mouth/Throat:     Pharynx: No oropharyngeal exudate.  Eyes:     Conjunctiva/sclera: Conjunctivae normal.     Pupils: Pupils are equal, round, and reactive to light.  Cardiovascular:     Rate and Rhythm: Normal rate and regular rhythm.     Heart sounds: Normal heart sounds.  Pulmonary:     Effort: Pulmonary effort is normal.     Breath sounds: Normal breath sounds.  Abdominal:     General: Bowel sounds are normal.     Palpations: Abdomen is soft.  Musculoskeletal:        General: No tenderness.     Cervical back: Normal range of motion and neck supple.  Skin:    General: Skin is warm and dry.  Neurological:     Mental Status: She is alert and oriented to person, place, and time.     Labs reviewed: Basic Metabolic Panel: Recent Labs    09/11/19 0807 01/29/20 0819 07/22/20 0810  NA 139 139 139  K 4.0 4.3 4.1  CL 105 104 105  CO2 23 24 24   GLUCOSE 107* 96 104*  BUN 14 18 17   CREATININE 0.74 0.73 0.68  CALCIUM 9.8 10.2 9.7  TSH 2.31 1.71 1.65   Liver Function Tests: Recent Labs    09/11/19 0807 01/29/20 0819 07/22/20 0810  AST 22 21 27   ALT 19 16 25   BILITOT 0.5 0.5 0.6  PROT 7.6 7.5 7.3   No results for input(s): LIPASE, AMYLASE in the last 8760 hours. No results for input(s): AMMONIA in the last 8760 hours. CBC: Recent Labs    09/11/19 0807 01/29/20 0819 07/22/20 0810  WBC 5.5 5.0 4.6  NEUTROABS 2,118 2,065 2,397  HGB 13.9 13.3 12.8  HCT 41.8 39.9 38.5  MCV 87.3 87.1 87.1  PLT 273 302 265   Lipid Panel: Recent Labs    09/11/19 0807 01/29/20 0819 07/22/20 0810  CHOL 151 146 143  HDL 42* 45* 42*  LDLCALC 88 82 80  TRIG 110 91 115  CHOLHDL 3.6 3.2 3.4   TSH: Recent Labs    09/11/19 0807 01/29/20 0819 07/22/20 0810  TSH 2.31 1.71 1.65   A1C: Lab Results  Component Value Date   HGBA1C 6.0 (H) 07/22/2020      Assessment/Plan .1. Controlled type 2 diabetes mellitus without complication, without long-term current use of insulin ([place]) -Encouraged dietary compliance, routine foot care/monitoring and to keep up with diabetic eye exams through ophthalmology  -unable to tolerate ACEi - metFORMIN (GLUCOPHAGE) 500 MG tablet; TAKE 1 TABLE BY MOUTH EVERY DAY WITH BREAKFAST TO CONTROL SUGAR  Dispense: 90 tablet; Refill: 1 - Hemoglobin A1c;  Future - Microalbumin, urine; Future  2. Essential hypertension, benign -controlled at this time. Not currently on medication, continues dietary modifications.  - COMPLETE METABOLIC PANEL WITH GFR; Future - CBC with Differential/Platelet; Future  3. Osteopenia, unspecified location -continues on cal and vit d with weight bearing exercises  4. Hyperlipidemia LDL goal <70 -continue zocor with dietary modifications.  - simvastatin (ZOCOR) 20 MG tablet; Take 1 tablet (20 mg total) by mouth daily at 6 PM.  Dispense: 90 tablet; Refill: 1 - Lipid Panel; Future  5. Osteoarthritis, unspecified osteoarthritis type, unspecified site -stable, without abnormal aches or pains at this time  Next appt: 6 months, labs prior  Emin Foree K. [place], [facility] [phone]

## 2020-09-15 DIAGNOSIS — L218 Other seborrheic dermatitis: Secondary | ICD-10-CM | POA: Diagnosis not present

## 2020-09-29 ENCOUNTER — Telehealth: Payer: Self-pay

## 2020-09-29 ENCOUNTER — Other Ambulatory Visit: Payer: Self-pay

## 2020-09-29 ENCOUNTER — Ambulatory Visit (INDEPENDENT_AMBULATORY_CARE_PROVIDER_SITE_OTHER): Payer: PPO | Admitting: Nurse Practitioner

## 2020-09-29 ENCOUNTER — Encounter: Payer: Self-pay | Admitting: Nurse Practitioner

## 2020-09-29 DIAGNOSIS — Z0000 Encounter for general adult medical examination without abnormal findings: Secondary | ICD-10-CM | POA: Diagnosis not present

## 2020-09-29 NOTE — Progress Notes (Signed)
   This service is provided via telemedicine  No vital signs collected/recorded due to the encounter was a telemedicine visit.   Location of patient (ex: home, work):  Home  Patient consents to a telephone visit: Yes, see telephone visit dated 09/29/2020  Location of the provider (ex: office, home):  [facility], Office   Name of any referring provider:  N/A  Names of all persons participating in the telemedicine service and their role in the encounter:  S.Chrae B/CMA, Sherrie Mustache, NP, and Patient   Time spent on call:  9 min with medical assistant

## 2020-09-29 NOTE — Progress Notes (Signed)
Subjective:   Jasmine James is a 75 y.o. female who presents for Medicare Annual (Subsequent) preventive examination.  Review of Systems     Cardiac Risk Factors include: dyslipidemia;diabetes mellitus;advanced age (>53men, >74 women)     Objective:    There were no vitals filed for this visit. There is no height or weight on file to calculate BMI.  Advanced Directives 09/29/2020 08/05/2020 01/22/2020 09/24/2019 09/18/2019 03/20/2019 09/05/2018  Does Patient Have a Medical Advance Directive? Yes Yes Yes Yes Yes No Yes  Type of Advance Directive Living will Living will - Living will Living will - Living will  Does patient want to make changes to medical advance directive? No - Patient declined No - Patient declined - No - Patient declined No - Patient declined - No - Patient declined  Copy of [place] in Chart? - - - - - - -  Would patient like information on creating a medical advance directive? - - - - - - -    Current Medications (verified) Outpatient Encounter Medications as of 09/29/2020  Medication Sig   acetaminophen (TYLENOL) 650 MG CR tablet Take 1,300 mg by mouth daily as needed for pain.   Blood Glucose Monitoring Suppl (ACCU-CHEK AVIVA) device 1 each by Other route daily. Check blood sugar once daily   Cholecalciferol (VITAMIN D) 125 MCG (5000 UT) CAPS Take 3 capsules by mouth daily.   glucose blood (ACCU-CHEK AVIVA PLUS) test strip 1 each by Other route daily.   Lancets (ACCU-CHEK SOFT TOUCH) lancets 1 each by Other route daily.   metFORMIN (GLUCOPHAGE) 500 MG tablet TAKE 1 TABLE BY MOUTH EVERY DAY WITH BREAKFAST TO CONTROL SUGAR   simvastatin (ZOCOR) 20 MG tablet Take 1 tablet (20 mg total) by mouth daily at 6 PM.   [DISCONTINUED] mupirocin ointment (BACTROBAN) 2 % Apply to affected area 2 times daily prn   No facility-administered encounter medications on file as of 09/29/2020.    Allergies (verified) Codeine and Sulfa antibiotics   History: Past  Medical History:  Diagnosis Date   Acute bronchitis    Anxiety    Disorder of bone and cartilage, unspecified    Elevated blood pressure reading without diagnosis of hypertension    Myalgia and myositis, unspecified    Osteoporosis, unspecified    Other abnormal blood chemistry    Other and unspecified hyperlipidemia    Plantar fascial fibromatosis    Type II or unspecified type diabetes mellitus without mention of complication, not stated as uncontrolled    Past Surgical History:  Procedure Laterality Date   BREAST SURGERY  04/17/1995   reduction   CATARACT EXTRACTION Right 04/16/2013   CATARACT EXTRACTION Left 04/16/2014   EYE SURGERY     Left eye contracts removed   History reviewed. No pertinent family history. Social History   Socioeconomic History   Marital status: Married    Spouse name: Not on file   Number of children: Not on file   Years of education: Not on file   Highest education level: Not on file  Occupational History   Not on file  Tobacco Use   Smoking status: Never   Smokeless tobacco: Never  Vaping Use   Vaping Use: Never used  Substance and Sexual Activity   Alcohol use: Yes    Comment: glass wine once a week   Drug use: No   Sexual activity: Not Currently  Other Topics Concern   Not on file  Social History Narrative  Not on file   Social Determinants of Health   Financial Resource Strain: Not on file  Food Insecurity: Not on file  Transportation Needs: Not on file  Physical Activity: Not on file  Stress: Not on file  Social Connections: Not on file    Tobacco Counseling Counseling given: Not Answered   Clinical Intake:  Pre-visit preparation completed: Yes  Pain : No/denies pain     BMI - recorded: 25 Nutritional Status: BMI 25 -29 Overweight Nutritional Risks: None  How often do you need to have someone help you when you read instructions, pamphlets, or other written materials from your doctor or pharmacy?: 1 -  Never  Diabetic?yes         Activities of Daily Living In your present state of health, do you have any difficulty performing the following activities: 09/29/2020  Hearing? N  Vision? N  Difficulty concentrating or making decisions? N  Walking or climbing stairs? N  Dressing or bathing? N  Doing errands, shopping? N  Preparing Food and eating ? N  Using the Toilet? N  In the past six months, have you accidently leaked urine? N  Do you have problems with loss of bowel control? N  Managing your Medications? N  Managing your Finances? N  Housekeeping or managing your Housekeeping? N  Some recent data might be hidden    Patient Care Team: Jasmine Chandler, NP as PCP - General (Nurse Practitioner)  Indicate any recent Medical Services you may have received from other than [facility] providers in the past year (date may be approximate).     Assessment:   This is a routine wellness examination for Jasmine James.  Hearing/Vision screen Hearing Screening - Comments:: No hearing loss  Vision Screening - Comments:: Last eye exam less than 12 months ago. Dr.Miller, Jasmine Main [employer]ister's clerk Visions)   Dietary issues and exercise activities discussed: Current Exercise Habits: Home exercise routine, Type of exercise: walking, Time (Minutes): 30, Frequency (Times/Week): 5, Weekly Exercise (Minutes/Week): 150   Goals Addressed   None    Depression Screen PHQ 2/9 Scores 09/29/2020 08/05/2020 01/22/2020 09/24/2019 03/20/2019 09/19/2018 09/13/2017  PHQ - 2 Score 0 0 0 0 0 0 0    Fall Risk Fall Risk  09/29/2020 08/05/2020 01/22/2020 09/24/2019 09/18/2019  Falls in the past year? 1 0 1 0 0  Number falls in past yr: 0 0 0 0 0  Injury with Fall? 0 0 1 0 0  Risk for fall due to : No Fall Risks - - - -  Follow up Falls evaluation completed - - - -    FALL RISK PREVENTION PERTAINING TO THE HOME:  Any stairs in or around the home? Yes  If so, are there any without handrails? No  Home free of loose throw rugs in  walkways, pet beds, electrical cords, etc? Yes  Adequate lighting in your home to reduce risk of falls? Yes   ASSISTIVE DEVICES UTILIZED TO PREVENT FALLS:  Life alert? No  Use of a cane, walker or w/c? No  Grab bars in the bathroom? Yes  Shower chair or bench in shower? No  Elevated toilet seat or a handicapped toilet? No   TIMED UP AND GO:  Was the test performed? No .    Cognitive Function: MMSE - Mini Mental State Exam 09/13/2017 08/16/2016  Orientation to time 4 5  Orientation to Place 4 5  Registration 3 3  Attention/ Calculation 5 5  Recall 2 2  Language- name 2  objects 2 2  Language- repeat 1 1  Language- follow 3 step command 3 3  Language- read & follow direction 1 1  Write a sentence 1 1  Copy design 1 1  Total score 27 29     6CIT Screen 09/29/2020 09/24/2019 09/19/2018  What Year? 0 points 0 points 0 points  What month? 0 points 0 points 0 points  What time? 0 points 0 points 0 points  Count back from 20 0 points 0 points 0 points  Months in reverse 0 points 0 points 0 points  Repeat phrase 0 points 0 points 0 points  Total Score 0 0 0    Immunizations Immunization History  Administered Date(s) Administered   Influenza, High Dose Seasonal PF 01/29/2019   Influenza, Quadrivalent, Recombinant, Inj, Pf 02/13/2018   Influenza-Unspecified 02/14/2015   PFIZER(Purple Top)SARS-COV-2 Vaccination 05/29/2019, 06/23/2019   Pneumococcal Conjugate-13 10/22/2013   Pneumococcal Polysaccharide-23 08/16/2016, 03/20/2019   Td 04/24/2009   Tdap 10/07/2016   Zoster Recombinat (Shingrix) 10/19/2017, 01/17/2018    TDAP status: Up to date  Flu Vaccine status: Up to date  Pneumococcal vaccine status: Up to date  Covid-19 vaccine status: Completed vaccines  Qualifies for Shingles Vaccine? Yes   Zostavax completed No   Shingrix Completed?: Yes  Screening Tests Health Maintenance  Topic Date Due   OPHTHALMOLOGY EXAM  11/14/2019   URINE MICROALBUMIN  09/17/2020    INFLUENZA VACCINE  11/14/2020   FOOT EXAM  01/21/2021   HEMOGLOBIN A1C  01/21/2021   MAMMOGRAM  02/26/2021   COLONOSCOPY (Pts 45-61yrs Insurance coverage will need to be confirmed)  06/29/2026   TETANUS/TDAP  10/08/2026   DEXA SCAN  Completed   Hepatitis C Screening  Completed   PNA vac Low Risk Adult  Completed   Zoster Vaccines- Shingrix  Completed   HPV VACCINES  Aged Out   COVID-19 Vaccine  Discontinued    Health Maintenance  Health Maintenance Due  Topic Date Due   OPHTHALMOLOGY EXAM  11/14/2019   URINE MICROALBUMIN  09/17/2020    Colorectal cancer screening: Type of screening: Colonoscopy. Completed 2018. Repeat every 10 years  Mammogram status: Completed 02/27/2019. Repeat every year  Bone Density status: Completed 02/27/2019. Results reflect: Bone density results: OSTEOPENIA. Repeat every 2 years.  Lung Cancer Screening: (Low Dose CT Chest recommended if Age 33-80 years, 30 pack-year currently smoking OR have quit w/in 15years.) does not qualify.   Lung Cancer Screening Referral: na  Additional Screening:  Hepatitis C Screening: does qualify; Completed  Vision Screening: Recommended annual ophthalmology exams for early detection of glaucoma and other disorders of the eye. Is the patient up to date with their annual eye exam?  Yes  Who is the provider or what is the name of the office in which the patient attends annual eye exams? [place] If pt is not established with a provider, would they like to be referred to a provider to establish care? No .   Dental Screening: Recommended annual dental exams for proper oral hygiene  Community Resource Referral / Chronic Care Management: CRR required this visit?  No   CCM required this visit?  No      Plan:     I have personally reviewed and noted the following in the patient's chart:   Medical and social history Use of alcohol, tobacco or illicit drugs  Current medications and supplements including opioid  prescriptions.  Functional ability and status Nutritional status Physical activity Advanced directives List of other  physicians Hospitalizations, surgeries, and ER visits in previous 12 months Vitals Screenings to include cognitive, depression, and falls Referrals and appointments  In addition, I have reviewed and discussed with patient certain preventive protocols, quality metrics, and best practice recommendations. A written personalized care plan for preventive services as well as general preventive health recommendations were provided to patient.     Jasmine Chandler, NP   09/29/2020    Virtual Visit via Telephone Note  I connected withNAME@ on 09/29/20 at  8:30 AM EDT by telephone and verified that I am speaking with the correct person using two identifiers.  Location: Patient: home Provider: [facility]   I discussed the limitations, risks, security and privacy concerns of performing an evaluation and management service by telephone and the availability of in person appointments. I also discussed with the patient that there may be a patient responsible charge related to this service. The patient expressed understanding and agreed to proceed.   I discussed the assessment and treatment plan with the patient. The patient was provided an opportunity to ask questions and all were answered. The patient agreed with the plan and demonstrated an understanding of the instructions.   The patient was advised to call back or seek an in-person evaluation if the symptoms worsen or if the condition fails to improve as anticipated.  I provided 15 minutes of non-face-to-face time during this encounter.  Carlos American. Harle Battiest Avs printed and mailed

## 2020-09-29 NOTE — Telephone Encounter (Signed)
[name], pavon are scheduled for a virtual visit with your provider today.    Just as we do with appointments in the office, we must obtain your consent to participate.  Your consent will be active for this visit and any virtual visit you may have with one of our providers in the next 365 days.    If you have a MyChart account, I can also send a copy of this consent to you electronically.  All virtual visits are billed to your insurance company just like a traditional visit in the office.  As this is a virtual visit, video technology does not allow for your provider to perform a traditional examination.  This may limit your provider's ability to fully assess your condition.  If your provider identifies any concerns that need to be evaluated in person or the need to arrange testing such as labs, EKG, etc, we will make arrangements to do so.    Although advances in technology are sophisticated, we cannot ensure that it will always work on either your end or our end.  If the connection with a video visit is poor, we may have to switch to a telephone visit.  With either a video or telephone visit, we are not always able to ensure that we have a secure connection.   I need to obtain your verbal consent now.   Are you willing to proceed with your visit today?   Jasmine James has provided verbal consent on 09/29/2020 for a virtual visit (video or telephone).   [name], [place] 09/29/2020  8:35 am

## 2020-09-29 NOTE — Patient Instructions (Signed)
Jasmine James , Thank you for taking time to come for your Medicare Wellness Visit. I appreciate your ongoing commitment to your health goals. Please review the following plan we discussed and let me know if I can assist you in the future.   Screening recommendations/referrals: Colonoscopy up to date Mammogram up to date Bone Density up to date Recommended yearly ophthalmology/optometry visit for glaucoma screening and checkup Recommended yearly dental visit for hygiene and checkup  Vaccinations: Influenza vaccine up to date Pneumococcal vaccine up to date Tdap vaccine up to date Shingles vaccine up to date    Advanced directives: on file  Conditions/risks identified: cardiac risk factors- advantage age, diabetes, hyperlipidemia.   Next appointment: 1 year    Preventive Care 75 Years and Older, Female Preventive care refers to lifestyle choices and visits with your health care provider that can promote health and wellness. What does preventive care include? A yearly physical exam. This is also called an annual well check. Dental exams once or twice a year. Routine eye exams. Ask your health care provider how often you should have your eyes checked. Personal lifestyle choices, including: Daily care of your teeth and gums. Regular physical activity. Eating a healthy diet. Avoiding tobacco and drug use. Limiting alcohol use. Practicing safe sex. Taking low-dose aspirin every day. Taking vitamin and mineral supplements as recommended by your health care provider. What happens during an annual well check? The services and screenings done by your health care provider during your annual well check will depend on your age, overall health, lifestyle risk factors, and family history of disease. Counseling  Your health care provider may ask you questions about your: Alcohol use. Tobacco use. Drug use. Emotional well-being. Home and relationship well-being. Sexual activity. Eating  habits. History of falls. Memory and ability to understand (cognition). Work and work Statistician. Reproductive health. Screening  You may have the following tests or measurements: Height, weight, and BMI. Blood pressure. Lipid and cholesterol levels. These may be checked every 5 years, or more frequently if you are over 54 years old. Skin check. Lung cancer screening. You may have this screening every year starting at age 75 if you have a 30-pack-year history of smoking and currently smoke or have quit within the past 15 years. Fecal occult blood test (FOBT) of the stool. You may have this test every year starting at age 75. Flexible sigmoidoscopy or colonoscopy. You may have a sigmoidoscopy every 5 years or a colonoscopy every 10 years starting at age 75. Hepatitis C blood test. Hepatitis B blood test. Sexually transmitted disease (STD) testing. Diabetes screening. This is done by checking your blood sugar (glucose) after you have not eaten for a while (fasting). You may have this done every 1-3 years. Bone density scan. This is done to screen for osteoporosis. You may have this done starting at age 75. Mammogram. This may be done every 1-2 years. Talk to your health care provider about how often you should have regular mammograms. Talk with your health care provider about your test results, treatment options, and if necessary, the need for more tests. Vaccines  Your health care provider may recommend certain vaccines, such as: Influenza vaccine. This is recommended every year. Tetanus, diphtheria, and acellular pertussis (Tdap, Td) vaccine. You may need a Td booster every 10 years. Zoster vaccine. You may need this after age 75. Pneumococcal 13-valent conjugate (PCV13) vaccine. One dose is recommended after age 34. Pneumococcal polysaccharide (PPSV23) vaccine. One dose is recommended after age  75. Talk to your health care provider about which screenings and vaccines you need and how  often you need them. This information is not intended to replace advice given to you by your health care provider. Make sure you discuss any questions you have with your health care provider. Document Released: 04/29/2015 Document Revised: 12/21/2015 Document Reviewed: 02/01/2015 Elsevier Interactive Patient Education  2017 [place] Prevention in the Home Falls can cause injuries. They can happen to people of all ages. There are many things you can do to make your home safe and to help prevent falls. What can I do on the outside of my home? Regularly fix the edges of walkways and driveways and fix any cracks. Remove anything that might make you trip as you walk through a door, such as a raised step or threshold. Trim any bushes or trees on the path to your home. Use bright outdoor lighting. Clear any walking paths of anything that might make someone trip, such as rocks or tools. Regularly check to see if handrails are loose or broken. Make sure that both sides of any steps have handrails. Any raised decks and porches should have guardrails on the edges. Have any leaves, snow, or ice cleared regularly. Use sand or salt on walking paths during winter. Clean up any spills in your garage right away. This includes oil or grease spills. What can I do in the bathroom? Use night lights. Install grab bars by the toilet and in the tub and shower. Do not use towel bars as grab bars. Use non-skid mats or decals in the tub or shower. If you need to sit down in the shower, use a plastic, non-slip stool. Keep the floor dry. Clean up any water that spills on the floor as soon as it happens. Remove soap buildup in the tub or shower regularly. Attach bath mats securely with double-sided non-slip rug tape. Do not have throw rugs and other things on the floor that can make you trip. What can I do in the bedroom? Use night lights. Make sure that you have a light by your bed that is easy to  reach. Do not use any sheets or blankets that are too big for your bed. They should not hang down onto the floor. Have a firm chair that has side arms. You can use this for support while you get dressed. Do not have throw rugs and other things on the floor that can make you trip. What can I do in the kitchen? Clean up any spills right away. Avoid walking on wet floors. Keep items that you use a lot in easy-to-reach places. If you need to reach something above you, use a strong step stool that has a grab bar. Keep electrical cords out of the way. Do not use floor polish or wax that makes floors slippery. If you must use wax, use non-skid floor wax. Do not have throw rugs and other things on the floor that can make you trip. What can I do with my stairs? Do not leave any items on the stairs. Make sure that there are handrails on both sides of the stairs and use them. Fix handrails that are broken or loose. Make sure that handrails are as long as the stairways. Check any carpeting to make sure that it is firmly attached to the stairs. Fix any carpet that is loose or worn. Avoid having throw rugs at the top or bottom of the stairs. If you do have  throw rugs, attach them to the floor with carpet tape. Make sure that you have a light switch at the top of the stairs and the bottom of the stairs. If you do not have them, ask someone to add them for you. What else can I do to help prevent falls? Wear shoes that: Do not have high heels. Have rubber bottoms. Are comfortable and fit you well. Are closed at the toe. Do not wear sandals. If you use a stepladder: Make sure that it is fully opened. Do not climb a closed stepladder. Make sure that both sides of the stepladder are locked into place. Ask someone to hold it for you, if possible. Clearly mark and make sure that you can see: Any grab bars or handrails. First and last steps. Where the edge of each step is. Use tools that help you move  around (mobility aids) if they are needed. These include: Canes. Walkers. Scooters. Crutches. Turn on the lights when you go into a dark area. Replace any light bulbs as soon as they burn out. Set up your furniture so you have a clear path. Avoid moving your furniture around. If any of your floors are uneven, fix them. If there are any pets around you, be aware of where they are. Review your medicines with your doctor. Some medicines can make you feel dizzy. This can increase your chance of falling. Ask your doctor what other things that you can do to help prevent falls. This information is not intended to replace advice given to you by your health care provider. Make sure you discuss any questions you have with your health care provider. Document Released: 01/27/2009 Document Revised: 09/08/2015 Document Reviewed: 05/07/2014 Elsevier Interactive Patient Education  2017 [employer].

## 2020-11-04 ENCOUNTER — Ambulatory Visit: Payer: PPO | Admitting: Podiatry

## 2020-12-02 ENCOUNTER — Other Ambulatory Visit: Payer: Self-pay

## 2020-12-02 ENCOUNTER — Ambulatory Visit (INDEPENDENT_AMBULATORY_CARE_PROVIDER_SITE_OTHER): Payer: PPO | Admitting: Podiatry

## 2020-12-02 ENCOUNTER — Encounter: Payer: Self-pay | Admitting: Podiatry

## 2020-12-02 DIAGNOSIS — M21539 Acquired clawfoot, unspecified foot: Secondary | ICD-10-CM

## 2020-12-02 DIAGNOSIS — M2011 Hallux valgus (acquired), right foot: Secondary | ICD-10-CM | POA: Diagnosis not present

## 2020-12-02 DIAGNOSIS — E119 Type 2 diabetes mellitus without complications: Secondary | ICD-10-CM

## 2020-12-02 DIAGNOSIS — M2012 Hallux valgus (acquired), left foot: Secondary | ICD-10-CM

## 2020-12-02 NOTE — Progress Notes (Signed)
ANNUAL DIABETIC FOOT EXAM  Subjective: Jasmine James presents today for for annual diabetic foot examination.  Patient relates 5 year h/o diabetes.  Patient denies any h/o foot wounds.  Patient denies symptoms of numbness, tingling or burning in feet.  Patient walks 3 miles/day for exercise and walks two hours every Sunday.  [name], NP is patient's PCP. Last visit was 08/05/2020.  Past Medical History:  Diagnosis Date   Acute bronchitis    Anxiety    Disorder of bone and cartilage, unspecified    Elevated blood pressure reading without diagnosis of hypertension    Myalgia and myositis, unspecified    Osteoporosis, unspecified    Other abnormal blood chemistry    Other and unspecified hyperlipidemia    Plantar fascial fibromatosis    Type II or unspecified type diabetes mellitus without mention of complication, not stated as uncontrolled    Patient Active Problem List   Diagnosis Date Noted   Diabetes mellitus ([place]) 02/27/2019   Osteoporosis 02/27/2019   Great toe pain, right 06/26/2018   Plantar fasciitis of left foot 01/29/2018   Closed nondisplaced fracture of fifth left metatarsal bone 10/08/2016   Controlled type 2 diabetes mellitus without complication, without long-term current use of insulin ([place]) 07/21/2015   Hyperlipidemia LDL goal <70 07/21/2015   Essential hypertension, benign 07/21/2015   Osteopenia 10/25/2013   Muscle pain 07/24/2012   Past Surgical History:  Procedure Laterality Date   BREAST SURGERY  04/17/1995   reduction   CATARACT EXTRACTION Right 04/16/2013   CATARACT EXTRACTION Left 04/16/2014   EYE SURGERY     Left eye contracts removed   Current Outpatient Medications on File Prior to Visit  Medication Sig Dispense Refill   acetaminophen (TYLENOL) 650 MG CR tablet Take 1,300 mg by mouth daily as needed for pain.     Blood Glucose Monitoring Suppl (ACCU-CHEK AVIVA) device 1 each by Other route daily. Check blood sugar once daily 1  each 0   Cholecalciferol (VITAMIN D) 125 MCG (5000 UT) CAPS Take 3 capsules by mouth daily.     glucose blood (ACCU-CHEK AVIVA PLUS) test strip 1 each by Other route daily. 100 each 5   Lancets (ACCU-CHEK SOFT TOUCH) lancets 1 each by Other route daily. 100 each 5   metFORMIN (GLUCOPHAGE) 500 MG tablet TAKE 1 TABLE BY MOUTH EVERY DAY WITH BREAKFAST TO CONTROL SUGAR 90 tablet 1   simvastatin (ZOCOR) 20 MG tablet Take 1 tablet (20 mg total) by mouth daily at 6 PM. 90 tablet 1   No current facility-administered medications on file prior to visit.    Allergies  Allergen Reactions   Codeine    Sulfa Antibiotics    Social History   Occupational History   Not on file  Tobacco Use   Smoking status: Never   Smokeless tobacco: Never  Vaping Use   Vaping Use: Never used  Substance and Sexual Activity   Alcohol use: Yes    Comment: glass wine once a week   Drug use: No   Sexual activity: Not Currently   History reviewed. No pertinent family history. Immunization History  Administered Date(s) Administered   Influenza, High Dose Seasonal PF 01/29/2019   Influenza, Quadrivalent, Recombinant, Inj, Pf 02/13/2018   Influenza-Unspecified 02/14/2015   PFIZER(Purple Top)SARS-COV-2 Vaccination 05/29/2019, 06/23/2019   Pneumococcal Conjugate-13 10/22/2013   Pneumococcal Polysaccharide-23 08/16/2016, 03/20/2019   Td 04/24/2009   Tdap 10/07/2016   Zoster Recombinat (Shingrix) 10/19/2017, 01/17/2018     Review  of Systems: Negative except as noted in the HPI.  Objective: There were no vitals filed for this visit.  Jasmine James is a pleasant 75 y.o. female in NAD. AAO X 3.  Vascular Examination: Capillary refill time to digits immediate b/l. Palpable DP pulse(s) b/l lower extremities Palpable PT pulse(s) b/l lower extremities Pedal hair present. Lower extremity skin temperature gradient within normal limits. No pain with calf compression b/l. No edema noted b/l lower  extremities.  Dermatological Examination: Pedal skin with normal turgor, texture and tone b/l lower extremities. No open wounds b/l lower extremities. No interdigital macerations b/l lower extremities. Toenails 1-5 bilaterally well maintained with adequate length. No erythema, no edema, no drainage, no fluctuance.  Musculoskeletal Examination: Normal muscle strength 5/5 to all lower extremity muscle groups bilaterally. No pain crepitus or joint limitation noted with ROM b/l lower extremities. Hallux valgus with bunion deformity noted b/l lower extremities. Clawtoe deformity L 2nd toe, L 3rd toe, R 2nd toe, and R 3rd toe. Patient ambulates independent of any assistive aids.  Footwear Assessment: Does the patient wear appropriate shoes? Yes. Does the patient need inserts/orthotics? No.  Neurological Examination: Protective sensation intact 5/5 intact bilaterally with 10g monofilament b/l. Vibratory sensation intact b/l.  Hemoglobin A1C Latest Ref Rng & Units 07/22/2020 01/29/2020  HGBA1C <5.7 % of total Hgb 6.0(H) 6.0(H)  Some recent data might be hidden   Assessment: 1. Acquired clawfoot, unspecified laterality   2. Hallux valgus, acquired, bilateral   3. Controlled type 2 diabetes mellitus without complication, without long-term current use of insulin ([place])   4. Encounter for diabetic foot exam ([place])     ADA Risk Categorization: Low Risk :  Patient has all of the following: Intact protective sensation No prior foot ulcer  No severe deformity Pedal pulses present  Plan: -Examined patient. -Diabetic foot examination performed today. -Discussed diabetic foot care principles. Literature dispensed on today. -Continue diabetic foot care principles: inspect feet daily, monitor glucose as recommended by PCP and/or Endocrinologist, and follow prescribed diet per PCP, Endocrinologist and/or dietician. -Patient to continue soft, supportive shoe gear daily. -Patient to report any pedal  injuries to medical professional immediately. -Patient/POA to call should there be question/concern in the interim.  Return in 1 year (on 12/02/2021) for annual diabetic foot examination.  Marzetta Board, DPM

## 2020-12-02 NOTE — Patient Instructions (Signed)

## 2021-01-19 ENCOUNTER — Other Ambulatory Visit: Payer: PPO

## 2021-01-19 ENCOUNTER — Other Ambulatory Visit: Payer: Self-pay

## 2021-01-19 DIAGNOSIS — E119 Type 2 diabetes mellitus without complications: Secondary | ICD-10-CM | POA: Diagnosis not present

## 2021-01-19 DIAGNOSIS — I10 Essential (primary) hypertension: Secondary | ICD-10-CM | POA: Diagnosis not present

## 2021-01-19 DIAGNOSIS — E785 Hyperlipidemia, unspecified: Secondary | ICD-10-CM | POA: Diagnosis not present

## 2021-01-20 LAB — CBC WITH DIFFERENTIAL/PLATELET
Absolute Monocytes: 426 cells/uL (ref 200–950)
Basophils Absolute: 41 cells/uL (ref 0–200)
Basophils Relative: 1 %
Eosinophils Absolute: 90 cells/uL (ref 15–500)
Eosinophils Relative: 2.2 %
HCT: 41.3 % (ref 35.0–45.0)
Hemoglobin: 13.6 g/dL (ref 11.7–15.5)
Lymphs Abs: 1599 cells/uL (ref 850–3900)
MCH: 29.3 pg (ref 27.0–33.0)
MCHC: 32.9 g/dL (ref 32.0–36.0)
MCV: 89 fL (ref 80.0–100.0)
MPV: 9.6 fL (ref 7.5–12.5)
Monocytes Relative: 10.4 %
Neutro Abs: 1943 cells/uL (ref 1500–7800)
Neutrophils Relative %: 47.4 %
Platelets: 270 10*3/uL (ref 140–400)
RBC: 4.64 10*6/uL (ref 3.80–5.10)
RDW: 13.1 % (ref 11.0–15.0)
Total Lymphocyte: 39 %
WBC: 4.1 10*3/uL (ref 3.8–10.8)

## 2021-01-20 LAB — LIPID PANEL
Cholesterol: 153 mg/dL (ref <200)
HDL: 43 mg/dL — ABNORMAL LOW (ref >=50)
LDL Cholesterol (Calc): 87 mg/dL (calc)
Non-HDL Cholesterol (Calc): 110 mg/dL (calc) (ref <130)
Total CHOL/HDL Ratio: 3.6 (calc) (ref <5.0)
Triglycerides: 126 mg/dL (ref <150)

## 2021-01-20 LAB — MICROALBUMIN, URINE: Microalb, Ur: 0.7 mg/dL

## 2021-01-20 LAB — COMPLETE METABOLIC PANEL WITH GFR
AG Ratio: 1.8 (calc) (ref 1.0–2.5)
ALT: 16 U/L (ref 6–29)
AST: 21 U/L (ref 10–35)
Albumin: 4.8 g/dL (ref 3.6–5.1)
Alkaline phosphatase (APISO): 79 U/L (ref 37–153)
BUN: 12 mg/dL (ref 7–25)
CO2: 25 mmol/L (ref 20–32)
Calcium: 9.9 mg/dL (ref 8.6–10.4)
Chloride: 105 mmol/L (ref 98–110)
Creat: 0.62 mg/dL (ref 0.60–1.00)
Globulin: 2.6 g/dL (calc) (ref 1.9–3.7)
Glucose, Bld: 94 mg/dL (ref 65–99)
Potassium: 4.2 mmol/L (ref 3.5–5.3)
Sodium: 141 mmol/L (ref 135–146)
Total Bilirubin: 0.5 mg/dL (ref 0.2–1.2)
Total Protein: 7.4 g/dL (ref 6.1–8.1)
eGFR: 93 mL/min/{1.73_m2} (ref >=60)

## 2021-01-20 LAB — HEMOGLOBIN A1C
Hgb A1c MFr Bld: 5.4 % of total Hgb (ref <5.7)
Mean Plasma Glucose: 108 mg/dL
eAG (mmol/L): 6 mmol/L

## 2021-01-27 ENCOUNTER — Other Ambulatory Visit: Payer: Self-pay

## 2021-01-27 ENCOUNTER — Ambulatory Visit (INDEPENDENT_AMBULATORY_CARE_PROVIDER_SITE_OTHER): Payer: PPO | Admitting: Nurse Practitioner

## 2021-01-27 ENCOUNTER — Encounter: Payer: Self-pay | Admitting: Nurse Practitioner

## 2021-01-27 VITALS — BP 132/90 | HR 87 | Temp 97.1°F | Ht 63.0 in | Wt 146.6 lb

## 2021-01-27 DIAGNOSIS — M8580 Other specified disorders of bone density and structure, unspecified site: Secondary | ICD-10-CM

## 2021-01-27 DIAGNOSIS — I10 Essential (primary) hypertension: Secondary | ICD-10-CM | POA: Diagnosis not present

## 2021-01-27 DIAGNOSIS — Z23 Encounter for immunization: Secondary | ICD-10-CM | POA: Diagnosis not present

## 2021-01-27 DIAGNOSIS — E119 Type 2 diabetes mellitus without complications: Secondary | ICD-10-CM

## 2021-01-27 DIAGNOSIS — E785 Hyperlipidemia, unspecified: Secondary | ICD-10-CM

## 2021-01-27 DIAGNOSIS — E2839 Other primary ovarian failure: Secondary | ICD-10-CM | POA: Diagnosis not present

## 2021-01-27 MED ORDER — METFORMIN HCL 500 MG PO TABS: 250.0000 mg | ORAL_TABLET | Freq: Every day | ORAL | 1 refills | Status: DC

## 2021-01-27 MED ORDER — SIMVASTATIN 20 MG PO TABS: 30.0000 mg | ORAL_TABLET | Freq: Every day | ORAL | 1 refills | Status: DC

## 2021-01-27 NOTE — Patient Instructions (Signed)
Decrease metformin to half tablet daily  Continue to work on diet  Increase zocor to 1.5 tablets daily to equal 30 mg daily

## 2021-01-27 NOTE — Progress Notes (Signed)
Careteam: Patient Care Team: [name], NP as PCP - General (Nurse Practitioner) Delsa Sale, OD (Optometry)  PLACE OF SERVICE:  [place]  Advanced Directive information    Allergies  Allergen Reactions   Codeine    Sulfa Antibiotics     Chief Complaint  Patient presents with   Medical Management of Chronic Issues    6 month follow-up and discuss labs (copy printed). Discuss need for eye exam, Dr.Miller (per patient, up to date). Foot exam (patient was recently seen at [facility]) and flu vaccine today.     HPI: Patient is a 75 y.o. female for routine follow up in office. She has no concerns today. Reports she wakes up and feels better throughout the day.   DM- A1c at goal, she is on metformin 500 mg daily and watches what she eats.   Hyperlipidemia- LDL slight elevated. On zocor 20 mg daily   Osteopenia- bone density due. Taking cal and vit d  Review of Systems:  Review of Systems  Constitutional:  Negative for chills, fever and weight loss.  HENT:  Negative for tinnitus.   Respiratory:  Negative for cough, sputum production and shortness of breath.   Cardiovascular:  Negative for chest pain, palpitations and leg swelling.  Gastrointestinal:  Negative for abdominal pain, constipation, diarrhea and heartburn.  Genitourinary:  Negative for dysuria, frequency and urgency.  Musculoskeletal:  Negative for back pain, falls, joint pain and myalgias.  Skin: Negative.   Neurological:  Negative for dizziness and headaches.  Psychiatric/Behavioral:  Negative for depression and memory loss. The patient does not have insomnia.    Past Medical History:  Diagnosis Date   Acute bronchitis    Anxiety    Disorder of bone and cartilage, unspecified    Elevated blood pressure reading without diagnosis of hypertension    Myalgia and myositis, unspecified    Osteoporosis, unspecified    Other abnormal blood chemistry    Other and unspecified hyperlipidemia    Plantar  fascial fibromatosis    Type II or unspecified type diabetes mellitus without mention of complication, not stated as uncontrolled    Past Surgical History:  Procedure Laterality Date   BREAST SURGERY  04/17/1995   reduction   CATARACT EXTRACTION Right 04/16/2013   CATARACT EXTRACTION Left 04/16/2014   EYE SURGERY     Left eye contracts removed   Social History:   reports that she has never smoked. She has never used smokeless tobacco. She reports current alcohol use. She reports that she does not use drugs.  Family History  Problem Relation Age of Onset   Dementia Mother    Memory loss Sister     Medications: Patient's Medications  New Prescriptions   No medications on file  Previous Medications   ACETAMINOPHEN (TYLENOL) 650 MG CR TABLET    Take 1,300 mg by mouth daily as needed for pain.   BLOOD GLUCOSE MONITORING SUPPL (ACCU-CHEK AVIVA) DEVICE    1 each by Other route daily. Check blood sugar once daily   CHOLECALCIFEROL (VITAMIN D) 125 MCG (5000 UT) CAPS    Take 3 capsules by mouth daily.   GLUCOSE BLOOD (ACCU-CHEK AVIVA PLUS) TEST STRIP    1 each by Other route daily.   LANCETS (ACCU-CHEK SOFT TOUCH) LANCETS    1 each by Other route daily.   METFORMIN (GLUCOPHAGE) 500 MG TABLET    TAKE 1 TABLE BY MOUTH EVERY DAY WITH BREAKFAST TO CONTROL SUGAR   SIMVASTATIN (  ZOCOR) 20 MG TABLET    Take 1 tablet (20 mg total) by mouth daily at 6 PM.  Modified Medications   No medications on file  Discontinued Medications   No medications on file    Physical Exam:  Vitals:   01/27/21 0858  Pulse: 87  Temp: (!) 97.1 F (36.2 C)  TempSrc: Temporal  SpO2: 96%  Weight: 146 lb 9.6 oz (66.5 kg)  Height: 5\' 3"  (1.6 m)   Body mass index is 25.97 kg/m. Wt Readings from Last 3 Encounters:  01/27/21 146 lb 9.6 oz (66.5 kg)  08/05/20 146 lb (66.2 kg)  01/22/20 142 lb 6.4 oz (64.6 kg)    Physical Exam Constitutional:      General: She is not in acute distress.    Appearance: She is  well-developed. She is not diaphoretic.  HENT:     Head: Normocephalic and atraumatic.     Mouth/Throat:     Pharynx: No oropharyngeal exudate.  Eyes:     Conjunctiva/sclera: Conjunctivae normal.     Pupils: Pupils are equal, round, and reactive to light.  Cardiovascular:     Rate and Rhythm: Normal rate and regular rhythm.     Heart sounds: Normal heart sounds.  Pulmonary:     Effort: Pulmonary effort is normal.     Breath sounds: Normal breath sounds.  Abdominal:     General: Bowel sounds are normal.     Palpations: Abdomen is soft.  Musculoskeletal:     Cervical back: Normal range of motion and neck supple.     Right lower leg: No edema.     Left lower leg: No edema.  Skin:    General: Skin is warm and dry.  Neurological:     Mental Status: She is alert and oriented to person, place, and time. Mental status is at baseline.  Psychiatric:        Mood and Affect: Mood normal.    Labs reviewed: Basic Metabolic Panel: Recent Labs    01/29/20 0819 07/22/20 0810 01/19/21 0850  NA 139 139 141  K 4.3 4.1 4.2  CL 104 105 105  CO2 24 24 25   GLUCOSE 96 104* 94  BUN 18 17 12   CREATININE 0.73 0.68 0.62  CALCIUM 10.2 9.7 9.9  TSH 1.71 1.65  --    Liver Function Tests: Recent Labs    01/29/20 0819 07/22/20 0810 01/19/21 0850  AST 21 27 21   ALT 16 25 16   BILITOT 0.5 0.6 0.5  PROT 7.5 7.3 7.4   No results for input(s): LIPASE, AMYLASE in the last 8760 hours. No results for input(s): AMMONIA in the last 8760 hours. CBC: Recent Labs    01/29/20 0819 07/22/20 0810 01/19/21 0850  WBC 5.0 4.6 4.1  NEUTROABS 2,065 2,397 1,943  HGB 13.3 12.8 13.6  HCT 39.9 38.5 41.3  MCV 87.1 87.1 89.0  PLT 302 265 270   Lipid Panel: Recent Labs    01/29/20 0819 07/22/20 0810 01/19/21 0850  CHOL 146 143 153  HDL 45* 42* 43*  LDLCALC 82 80 87  TRIG 91 115 126  CHOLHDL 3.2 3.4 3.6   TSH: Recent Labs    01/29/20 0819 07/22/20 0810  TSH 1.71 1.65   A1C: Lab Results   Component Value Date   HGBA1C 5.4 01/19/2021     Assessment/Plan 1. Need for influenza vaccination - Flu Vaccine QUAD High Dose(Fluad)  2. Controlled type 2 diabetes mellitus without complication, without long-term current use of  insulin ([place]) -well controlled,Encouraged dietary compliance, routine foot care/monitoring and to keep up with diabetic eye exams through ophthalmology  -will reduce meformin to half tablet daily at this time.  - metFORMIN (GLUCOPHAGE) 500 MG tablet; Take 0.5 tablets (250 mg total) by mouth daily with breakfast.  Dispense: 90 tablet; Refill: 1  3. Essential hypertension, benign --Blood pressure elevated today, but typically well controlled -Patient reports bp typically elevated in office and home blood pressures are well controlled -continue dietary modifications.  -will have pt continue to monitor home bp goal <444/61 -follow metabolic panel   4. Osteopenia, unspecified location -continue cal and vit d, due for follow up dexa scan  5. Hyperlipidemia LDL goal <70 -continue with dietary modifications will increase zocor to 30 mg daily to get to goal.  - simvastatin (ZOCOR) 20 MG tablet; Take 1.5 tablets (30 mg total) by mouth daily at 6 PM.  Dispense: 90 tablet; Refill: 1  6. Estrogen deficiency - DG Bone Density; Future   Next appt: 4 months with labs prior since adjusting medications Mirha Brucato K. [place], [facility] [phone]

## 2021-02-19 ENCOUNTER — Other Ambulatory Visit: Payer: Self-pay | Admitting: Nurse Practitioner

## 2021-02-19 DIAGNOSIS — E119 Type 2 diabetes mellitus without complications: Secondary | ICD-10-CM

## 2021-03-28 ENCOUNTER — Other Ambulatory Visit: Payer: Self-pay | Admitting: *Deleted

## 2021-03-28 ENCOUNTER — Other Ambulatory Visit: Payer: Self-pay | Admitting: Nurse Practitioner

## 2021-03-28 DIAGNOSIS — E119 Type 2 diabetes mellitus without complications: Secondary | ICD-10-CM

## 2021-03-28 MED ORDER — METFORMIN HCL 500 MG PO TABS: 250.0000 mg | ORAL_TABLET | Freq: Every day | ORAL | 3 refills | Status: DC

## 2021-03-28 NOTE — Telephone Encounter (Signed)
Patient requested refill. Stated that Rx was denied earlier.  Reviewed patient's chart and the last RF dated was set to print on 01/27/2021. Patient is due.

## 2021-05-19 ENCOUNTER — Other Ambulatory Visit: Payer: Self-pay

## 2021-05-19 DIAGNOSIS — E119 Type 2 diabetes mellitus without complications: Secondary | ICD-10-CM

## 2021-05-19 MED ORDER — METFORMIN HCL 500 MG PO TABS: 500.0000 mg | ORAL_TABLET | Freq: Every day | ORAL | 0 refills | Status: DC

## 2021-05-19 NOTE — Telephone Encounter (Signed)
Patient came into office today stating that she's out of medication "Metformin 500mg ". Patient states she spoke to [facility] May/CMA and was told that shes suppose to be taking 0.5 tablet. However patient has been taking whole tablet. Issue discussed with Sherrie Mustache, NP. Per PCP it is Ok to send patient in 30 day supply and she will follow up with patient at appointment 06/02/2021. Message routed to both PCP Dewaine Oats Carlos American, NP and Rafael Bihari, [place]. No further action required.

## 2021-05-24 ENCOUNTER — Other Ambulatory Visit: Payer: Self-pay

## 2021-05-24 ENCOUNTER — Other Ambulatory Visit: Payer: PPO

## 2021-05-24 DIAGNOSIS — E119 Type 2 diabetes mellitus without complications: Secondary | ICD-10-CM | POA: Diagnosis not present

## 2021-05-24 DIAGNOSIS — E785 Hyperlipidemia, unspecified: Secondary | ICD-10-CM

## 2021-05-25 ENCOUNTER — Other Ambulatory Visit: Payer: PPO

## 2021-05-25 DIAGNOSIS — Z1231 Encounter for screening mammogram for malignant neoplasm of breast: Secondary | ICD-10-CM | POA: Diagnosis not present

## 2021-05-25 DIAGNOSIS — Z6827 Body mass index (BMI) 27.0-27.9, adult: Secondary | ICD-10-CM | POA: Diagnosis not present

## 2021-05-25 DIAGNOSIS — M8588 Other specified disorders of bone density and structure, other site: Secondary | ICD-10-CM | POA: Diagnosis not present

## 2021-05-25 DIAGNOSIS — R29890 Loss of height: Secondary | ICD-10-CM | POA: Diagnosis not present

## 2021-05-25 DIAGNOSIS — Z01419 Encounter for gynecological examination (general) (routine) without abnormal findings: Secondary | ICD-10-CM | POA: Diagnosis not present

## 2021-05-25 DIAGNOSIS — N958 Other specified menopausal and perimenopausal disorders: Secondary | ICD-10-CM | POA: Diagnosis not present

## 2021-05-25 LAB — LIPID PANEL
Cholesterol: 151 mg/dL (ref <200)
HDL: 42 mg/dL — ABNORMAL LOW (ref >=50)
LDL Cholesterol (Calc): 93 mg/dL (calc)
Non-HDL Cholesterol (Calc): 109 mg/dL (calc) (ref <130)
Total CHOL/HDL Ratio: 3.6 (calc) (ref <5.0)
Triglycerides: 72 mg/dL (ref <150)

## 2021-05-25 LAB — HEMOGLOBIN A1C
Hgb A1c MFr Bld: 5.8 % of total Hgb — ABNORMAL HIGH (ref <5.7)
Mean Plasma Glucose: 120 mg/dL
eAG (mmol/L): 6.6 mmol/L

## 2021-05-25 LAB — COMPLETE METABOLIC PANEL WITH GFR
AG Ratio: 1.8 (calc) (ref 1.0–2.5)
ALT: 18 U/L (ref 6–29)
AST: 20 U/L (ref 10–35)
Albumin: 4.6 g/dL (ref 3.6–5.1)
Alkaline phosphatase (APISO): 83 U/L (ref 37–153)
BUN: 18 mg/dL (ref 7–25)
CO2: 30 mmol/L (ref 20–32)
Calcium: 9.7 mg/dL (ref 8.6–10.4)
Chloride: 104 mmol/L (ref 98–110)
Creat: 0.7 mg/dL (ref 0.60–1.00)
Globulin: 2.6 g/dL (calc) (ref 1.9–3.7)
Glucose, Bld: 92 mg/dL (ref 65–99)
Potassium: 4.2 mmol/L (ref 3.5–5.3)
Sodium: 141 mmol/L (ref 135–146)
Total Bilirubin: 0.7 mg/dL (ref 0.2–1.2)
Total Protein: 7.2 g/dL (ref 6.1–8.1)
eGFR: 90 mL/min/{1.73_m2} (ref >=60)

## 2021-05-25 LAB — HM DEXA SCAN

## 2021-05-25 LAB — HM MAMMOGRAPHY

## 2021-05-26 ENCOUNTER — Encounter: Payer: Self-pay | Admitting: *Deleted

## 2021-06-01 ENCOUNTER — Other Ambulatory Visit: Payer: PPO

## 2021-06-02 ENCOUNTER — Other Ambulatory Visit: Payer: Self-pay

## 2021-06-02 ENCOUNTER — Encounter: Payer: Self-pay | Admitting: Nurse Practitioner

## 2021-06-02 ENCOUNTER — Ambulatory Visit (INDEPENDENT_AMBULATORY_CARE_PROVIDER_SITE_OTHER): Payer: PPO | Admitting: Nurse Practitioner

## 2021-06-02 VITALS — BP 142/88 | HR 85 | Temp 97.3°F | Ht 61.5 in | Wt 151.0 lb

## 2021-06-02 DIAGNOSIS — E118 Type 2 diabetes mellitus with unspecified complications: Secondary | ICD-10-CM | POA: Diagnosis not present

## 2021-06-02 DIAGNOSIS — M8580 Other specified disorders of bone density and structure, unspecified site: Secondary | ICD-10-CM | POA: Diagnosis not present

## 2021-06-02 DIAGNOSIS — E785 Hyperlipidemia, unspecified: Secondary | ICD-10-CM

## 2021-06-02 DIAGNOSIS — I10 Essential (primary) hypertension: Secondary | ICD-10-CM | POA: Diagnosis not present

## 2021-06-02 DIAGNOSIS — E119 Type 2 diabetes mellitus without complications: Secondary | ICD-10-CM

## 2021-06-02 DIAGNOSIS — M1990 Unspecified osteoarthritis, unspecified site: Secondary | ICD-10-CM | POA: Diagnosis not present

## 2021-06-02 MED ORDER — FREESTYLE LIBRE 2 READER DEVI: 1.0000 | Freq: Every day | 0 refills | Status: DC

## 2021-06-02 MED ORDER — FREESTYLE LIBRE 2 SENSOR MISC: 2.0000 | Freq: Every day | 12 refills | Status: DC

## 2021-06-02 MED ORDER — METFORMIN HCL 500 MG PO TABS: 250.0000 mg | ORAL_TABLET | Freq: Every day | ORAL | 1 refills | Status: DC

## 2021-06-02 NOTE — Patient Instructions (Addendum)
Please sign a record release for bone density- GYN- Physician for women on check out.    Decrease metformin to half tablet daily  Make sure you are taking 1.5 tablet of simvastatin

## 2021-06-02 NOTE — Progress Notes (Signed)
Careteam: Patient Care Team: [name], NP as PCP - General (Nurse Practitioner) Delsa Sale, OD (Optometry)  PLACE OF SERVICE:  [place]  Advanced Directive information    Allergies  Allergen Reactions   Codeine    Sulfa Antibiotics     Chief Complaint  Patient presents with   Medical Management of Chronic Issues    4 month follow-up. Discuss need for eye exam or post pone if patient refuses (patient plans to schedule with Dr.Miller soon and will have results faxed to [place]). Fot the last 7 days patient is taking 1/2 table of metformin, will need to discuss. Patient is only taking 1 tablet of simvastatin versus 1 1/2, need to discuss. Discuss alternate blood sugar machine      HPI: Patient is a 76 y.o. female for routine follow up  Does not like to prick her finger for blood sugars. She has gained weight. She retired since last visit.  She was taking whole metformin vs half.  She continues to walk and remain active. A1c up to 5.8   Blood pressure at home was in the 120s.   She is giving blood regularly last few times it drained her so she does not feel like she will continue to give  Hyperlipidemia- she is only taking 1 tablet vs 1.5  She feels great. Has a lot of energy.   Review of Systes:  Review of Systems  Constitutional:  Negative for chills, fever and weight loss.  HENT:  Negative for tinnitus.   Respiratory:  Negative for cough, sputum production and shortness of breath.   Cardiovascular:  Negative for chest pain, palpitations and leg swelling.  Gastrointestinal:  Negative for abdominal pain, constipation, diarrhea and heartburn.  Genitourinary:  Negative for dysuria, frequency and urgency.  Musculoskeletal:  Negative for back pain, falls, joint pain and myalgias.  Skin: Negative.   Neurological:  Negative for dizziness and headaches.  Psychiatric/Behavioral:  Negative for depression and memory loss. The patient does not have insomnia.     Past Medical History:  Diagnosis Date   Acute bronchitis    Anxiety    Disorder of bone and cartilage, unspecified    Elevated blood pressure reading without diagnosis of hypertension    Myalgia and myositis, unspecified    Osteoporosis, unspecified    Other abnormal blood chemistry    Other and unspecified hyperlipidemia    Plantar fascial fibromatosis    Type II or unspecified type diabetes mellitus without mention of complication, not stated as uncontrolled    Past Surgical History:  Procedure Laterality Date   BREAST SURGERY  04/17/1995   reduction   CATARACT EXTRACTION Right 04/16/2013   CATARACT EXTRACTION Left 04/16/2014   EYE SURGERY     Left eye contracts removed   Social History:   reports that she has never smoked. She has never used smokeless tobacco. She reports current alcohol use. She reports that she does not use drugs.  Family History  Problem Relation Age of Onset   Dementia Mother    Memory loss Sister    Dementia Sister     Medications: Patient's Medications  New Prescriptions   No medications on file  Previous Medications   ACETAMINOPHEN (TYLENOL) 650 MG CR TABLET    Take 1,300 mg by mouth daily as needed for pain.   BLOOD GLUCOSE MONITORING SUPPL (ACCU-CHEK AVIVA) DEVICE    1 each by Other route daily. Check blood sugar once daily   CHOLECALCIFEROL (  VITAMIN D) 125 MCG (5000 UT) CAPS    Take 3 capsules by mouth daily.   GLUCOSE BLOOD (ACCU-CHEK AVIVA PLUS) TEST STRIP    1 each by Other route daily.   LANCETS (ACCU-CHEK SOFT TOUCH) LANCETS    1 each by Other route daily.   METFORMIN (GLUCOPHAGE) 500 MG TABLET    Take 1 tablet (500 mg total) by mouth daily with breakfast.   SIMVASTATIN (ZOCOR) 20 MG TABLET    Take 1.5 tablets (30 mg total) by mouth daily at 6 PM.  Modified Medications   No medications on file  Discontinued Medications   No medications on file    Physical Exam:  Vitals:   06/02/21 0929  BP: (!) 142/88  Pulse: 85  Temp: (!)  97.3 F (36.3 C)  TempSrc: Temporal  SpO2: 96%  Weight: 151 lb (68.5 kg)  Height: 5' 1.5" (1.562 m)   Body mass index is 28.07 kg/m. Wt Readings from Last 3 Encounters:  06/02/21 151 lb (68.5 kg)  01/27/21 146 lb 9.6 oz (66.5 kg)  08/05/20 146 lb (66.2 kg)    Physical Exam Constitutional:      General: She is not in acute distress.    Appearance: She is well-developed. She is not diaphoretic.  HENT:     Head: Normocephalic and atraumatic.     Mouth/Throat:     Pharynx: No oropharyngeal exudate.  Eyes:     Conjunctiva/sclera: Conjunctivae normal.     Pupils: Pupils are equal, round, and reactive to light.  Cardiovascular:     Rate and Rhythm: Normal rate and regular rhythm.     Heart sounds: Normal heart sounds.  Pulmonary:     Effort: Pulmonary effort is normal.     Breath sounds: Normal breath sounds.  Abdominal:     General: Bowel sounds are normal.     Palpations: Abdomen is soft.  Musculoskeletal:     Cervical back: Normal range of motion and neck supple.     Right lower leg: No edema.     Left lower leg: No edema.  Skin:    General: Skin is warm and dry.  Neurological:     Mental Status: She is alert.  Psychiatric:        Mood and Affect: Mood normal.    Labs reviewed: Basic Metabolic Panel: Recent Labs    07/22/20 0810 01/19/21 0850 05/24/21 0820  NA 139 141 141  K 4.1 4.2 4.2  CL 105 105 104  CO2 24 25 30   GLUCOSE 104* 94 92  BUN 17 12 18   CREATININE 0.68 0.62 0.70  CALCIUM 9.7 9.9 9.7  TSH 1.65  --   --    Liver Function Tests: Recent Labs    07/22/20 0810 01/19/21 0850 05/24/21 0820  AST 27 21 20   ALT 25 16 18   BILITOT 0.6 0.5 0.7  PROT 7.3 7.4 7.2   No results for input(s): LIPASE, AMYLASE in the last 8760 hours. No results for input(s): AMMONIA in the last 8760 hours. CBC: Recent Labs    07/22/20 0810 01/19/21 0850  WBC 4.6 4.1  NEUTROABS 2,397 1,943  HGB 12.8 13.6  HCT 38.5 41.3  MCV 87.1 89.0  PLT 265 270   Lipid  Panel: Recent Labs    07/22/20 0810 01/19/21 0850 05/24/21 0820  CHOL 143 153 151  HDL 42* 43* 42*  LDLCALC 80 87 93  TRIG 115 126 72  CHOLHDL 3.4 3.6 3.6   TSH: Recent  Labs    07/22/20 0810  TSH 1.65   A1C: Lab Results  Component Value Date   HGBA1C 5.8 (H) 05/24/2021     Assessment/Plan 1. Controlled type 2 diabetes mellitus with complication, without long-term current use of insulin ([place]) -Encouraged dietary compliance, routine foot care/monitoring and to keep up with diabetic eye exams through ophthalmology  -continue half tablet of metformin daily with meal - Continuous Blood Gluc Receiver (FREESTYLE LIBRE 2 READER) DEVI; 1 Device by Does not apply route daily at 12 noon.  Dispense: 1 each; Refill: 0 - Continuous Blood Gluc Sensor (FREESTYLE LIBRE 2 SENSOR) MISC; 2 Devices by Does not apply route daily.  Dispense: 2 each; Refill: 12 - Hemoglobin A1c; Future  2. Hyperlipidemia LDL goal <70 -dietary modifications encouaged, she plans on starting to take 1.5 tablets of zocor daily. - CMP with eGFR([facility]); Future - Lipid panel; Future  3. Osteoarthritis, unspecified osteoarthritis type, unspecified site -stable, continue lifestyle modifications.   4. Essential hypertension, benign --Blood pressure elevated today but typically well controlled -home blood pressures are well controlled -No changes to medications today  -will have pt continue to monitor home bp goal <140/90, to notify if readings remain high on 3 different days  -follow metabolic panel - CBC with Differential/Platelet; Future - CMP with eGFR([facility]); Future  5. Osteopenia, unspecified location -Recommended to take calcium 600 mg twice daily with Vitamin D 2000 units daily and weight bearing activity 30 mins/5 days a week  6. Controlled type 2 diabetes mellitus without complication, without long-term current use of insulin ([facility]) - metFORMIN (GLUCOPHAGE) 500 MG tablet; Take 0.5 tablets (250 mg total)  by mouth daily with breakfast.  Dispense: 45 tablet; Refill: 1    Return in about 4 months (around 09/30/2021) for routine follow up, labs prior . Carlos American. [place], [facility] [phone]

## 2021-07-10 ENCOUNTER — Telehealth: Payer: Self-pay

## 2021-07-10 NOTE — Telephone Encounter (Signed)
Yes would recommend evaluation in office or urgent care if worsens  ?

## 2021-07-10 NOTE — Telephone Encounter (Signed)
Patient states that she has blood in stool since Friday and she would like to know if she should be worried. Patient states it was like diarrhea with more blood than stool like clots. Diarrhea has stopped and still has a little blood in stool. Stool not real firm. Please advise. ? ?Message routed to Sherrie Mustache, NP ?

## 2021-07-10 NOTE — Telephone Encounter (Signed)
I informed patient she needs to be evaluated in office or go to urgent care. Patient stated that she will call back if it gets worse to schedule an appointment. ?

## 2021-08-21 DIAGNOSIS — E119 Type 2 diabetes mellitus without complications: Secondary | ICD-10-CM | POA: Diagnosis not present

## 2021-08-21 DIAGNOSIS — H43393 Other vitreous opacities, bilateral: Secondary | ICD-10-CM | POA: Diagnosis not present

## 2021-08-21 LAB — HM DIABETES EYE EXAM

## 2021-09-04 ENCOUNTER — Encounter: Payer: Self-pay | Admitting: *Deleted

## 2021-09-14 HISTORY — PX: EYE SURGERY: SHX253

## 2021-09-16 ENCOUNTER — Other Ambulatory Visit: Payer: Self-pay | Admitting: Nurse Practitioner

## 2021-09-16 DIAGNOSIS — E785 Hyperlipidemia, unspecified: Secondary | ICD-10-CM

## 2021-10-02 NOTE — Patient Instructions (Signed)
Jasmine James , Thank you for taking time to come for your Medicare Wellness Visit. I appreciate your ongoing commitment to your health goals. Please review the following plan we discussed and let me know if I can assist you in the future.   Screening recommendations/referrals: Colonoscopy up to date Mammogram up to date Bone Density up to date Recommended yearly ophthalmology/optometry visit for glaucoma screening and checkup Recommended yearly dental visit for hygiene and checkup  Vaccinations: Influenza vaccine up to date Pneumococcal vaccine up to date Tdap vaccine up to date Shingles vaccine up to date    Advanced directives: recommended to bring to office so we can place on file.   Conditions/risks identified: diabetes, hyperlipidemia, advance age  Next appointment: yearly for awv   Preventive Care 71 Years and Older, Female Preventive care refers to lifestyle choices and visits with your health care provider that can promote health and wellness. What does preventive care include? A yearly physical exam. This is also called an annual well check. Dental exams once or twice a year. Routine eye exams. Ask your health care provider how often you should have your eyes checked. Personal lifestyle choices, including: Daily care of your teeth and gums. Regular physical activity. Eating a healthy diet. Avoiding tobacco and drug use. Limiting alcohol use. Practicing safe sex. Taking low-dose aspirin every day. Taking vitamin and mineral supplements as recommended by your health care provider. What happens during an annual well check? The services and screenings done by your health care provider during your annual well check will depend on your age, overall health, lifestyle risk factors, and family history of disease. Counseling  Your health care provider may ask you questions about your: Alcohol use. Tobacco use. Drug use. Emotional well-being. Home and relationship  well-being. Sexual activity. Eating habits. History of falls. Memory and ability to understand (cognition). Work and work Statistician. Reproductive health. Screening  You may have the following tests or measurements: Height, weight, and BMI. Blood pressure. Lipid and cholesterol levels. These may be checked every 5 years, or more frequently if you are over 76 years old. Skin check. Lung cancer screening. You may have this screening every year starting at age 15 if you have a 30-pack-year history of smoking and currently smoke or have quit within the past 15 years. Fecal occult blood test (FOBT) of the stool. You may have this test every year starting at age 26. Flexible sigmoidoscopy or colonoscopy. You may have a sigmoidoscopy every 5 years or a colonoscopy every 10 years starting at age 51. Hepatitis C blood test. Hepatitis B blood test. Sexually transmitted disease (STD) testing. Diabetes screening. This is done by checking your blood sugar (glucose) after you have not eaten for a while (fasting). You may have this done every 1-3 years. Bone density scan. This is done to screen for osteoporosis. You may have this done starting at age 5. Mammogram. This may be done every 1-2 years. Talk to your health care provider about how often you should have regular mammograms. Talk with your health care provider about your test results, treatment options, and if necessary, the need for more tests. Vaccines  Your health care provider may recommend certain vaccines, such as: Influenza vaccine. This is recommended every year. Tetanus, diphtheria, and acellular pertussis (Tdap, Td) vaccine. You may need a Td booster every 10 years. Zoster vaccine. You may need this after age 30. Pneumococcal 13-valent conjugate (PCV13) vaccine. One dose is recommended after age 76. Pneumococcal polysaccharide (PPSV23) vaccine.  One dose is recommended after age 81. Talk to your health care provider about which  screenings and vaccines you need and how often you need them. This information is not intended to replace advice given to you by your health care provider. Make sure you discuss any questions you have with your health care provider. Document Released: 04/29/2015 Document Revised: 12/21/2015 Document Reviewed: 02/01/2015 Elsevier Interactive Patient Education  2017 [place] Prevention in the Home Falls can cause injuries. They can happen to people of all ages. There are many things you can do to make your home safe and to help prevent falls. What can I do on the outside of my home? Regularly fix the edges of walkways and driveways and fix any cracks. Remove anything that might make you trip as you walk through a door, such as a raised step or threshold. Trim any bushes or trees on the path to your home. Use bright outdoor lighting. Clear any walking paths of anything that might make someone trip, such as rocks or tools. Regularly check to see if handrails are loose or broken. Make sure that both sides of any steps have handrails. Any raised decks and porches should have guardrails on the edges. Have any leaves, snow, or ice cleared regularly. Use sand or salt on walking paths during winter. Clean up any spills in your garage right away. This includes oil or grease spills. What can I do in the bathroom? Use night lights. Install grab bars by the toilet and in the tub and shower. Do not use towel bars as grab bars. Use non-skid mats or decals in the tub or shower. If you need to sit down in the shower, use a plastic, non-slip stool. Keep the floor dry. Clean up any water that spills on the floor as soon as it happens. Remove soap buildup in the tub or shower regularly. Attach bath mats securely with double-sided non-slip rug tape. Do not have throw rugs and other things on the floor that can make you trip. What can I do in the bedroom? Use night lights. Make sure that you have a  light by your bed that is easy to reach. Do not use any sheets or blankets that are too big for your bed. They should not hang down onto the floor. Have a firm chair that has side arms. You can use this for support while you get dressed. Do not have throw rugs and other things on the floor that can make you trip. What can I do in the kitchen? Clean up any spills right away. Avoid walking on wet floors. Keep items that you use a lot in easy-to-reach places. If you need to reach something above you, use a strong step stool that has a grab bar. Keep electrical cords out of the way. Do not use floor polish or wax that makes floors slippery. If you must use wax, use non-skid floor wax. Do not have throw rugs and other things on the floor that can make you trip. What can I do with my stairs? Do not leave any items on the stairs. Make sure that there are handrails on both sides of the stairs and use them. Fix handrails that are broken or loose. Make sure that handrails are as long as the stairways. Check any carpeting to make sure that it is firmly attached to the stairs. Fix any carpet that is loose or worn. Avoid having throw rugs at the top or bottom of  the stairs. If you do have throw rugs, attach them to the floor with carpet tape. Make sure that you have a light switch at the top of the stairs and the bottom of the stairs. If you do not have them, ask someone to add them for you. What else can I do to help prevent falls? Wear shoes that: Do not have high heels. Have rubber bottoms. Are comfortable and fit you well. Are closed at the toe. Do not wear sandals. If you use a stepladder: Make sure that it is fully opened. Do not climb a closed stepladder. Make sure that both sides of the stepladder are locked into place. Ask someone to hold it for you, if possible. Clearly mark and make sure that you can see: Any grab bars or handrails. First and last steps. Where the edge of each step  is. Use tools that help you move around (mobility aids) if they are needed. These include: Canes. Walkers. Scooters. Crutches. Turn on the lights when you go into a dark area. Replace any light bulbs as soon as they burn out. Set up your furniture so you have a clear path. Avoid moving your furniture around. If any of your floors are uneven, fix them. If there are any pets around you, be aware of where they are. Review your medicines with your doctor. Some medicines can make you feel dizzy. This can increase your chance of falling. Ask your doctor what other things that you can do to help prevent falls. This information is not intended to replace advice given to you by your health care provider. Make sure you discuss any questions you have with your health care provider. Document Released: 01/27/2009 Document Revised: 09/08/2015 Document Reviewed: 05/07/2014 Elsevier Interactive Patient Education  2017 [employer].

## 2021-10-02 NOTE — Progress Notes (Signed)
Subjective:   Jasmine GORNEY is a 76 y.o. female who presents for Medicare Annual (Subsequent) preventive examination.  Review of Systems           Objective:    There were no vitals filed for this visit. There is no height or weight on file to calculate BMI.     09/29/2020    8:42 AM 08/05/2020    8:42 AM 01/22/2020    9:38 AM 09/24/2019    8:59 AM 09/18/2019    8:36 AM 03/20/2019    9:12 AM 09/05/2018    9:38 AM  Advanced Directives  Does Patient Have a Medical Advance Directive? Yes Yes Yes Yes Yes No Yes  Type of Advance Directive Living will Living will  Living will Living will  Living will  Does patient want to make changes to medical advance directive? No - Patient declined No - Patient declined  No - Patient declined No - Patient declined  No - Patient declined    Current Medications (verified) Outpatient Encounter Medications as of 10/03/2021  Medication Sig   acetaminophen (TYLENOL) 650 MG CR tablet Take 1,300 mg by mouth daily as needed for pain.   Blood Glucose Monitoring Suppl (ACCU-CHEK AVIVA) device 1 each by Other route daily. Check blood sugar once daily (Patient not taking: Reported on 06/02/2021)   Cholecalciferol (VITAMIN D) 125 MCG (5000 UT) CAPS Take 3 capsules by mouth daily.   Continuous Blood Gluc Receiver (FREESTYLE LIBRE 2 READER) DEVI 1 Device by Does not apply route daily at 12 noon.   Continuous Blood Gluc Sensor (FREESTYLE LIBRE 2 SENSOR) MISC 2 Devices by Does not apply route daily.   glucose blood (ACCU-CHEK AVIVA PLUS) test strip 1 each by Other route daily. (Patient not taking: Reported on 06/02/2021)   Lancets (ACCU-CHEK SOFT TOUCH) lancets 1 each by Other route daily. (Patient not taking: Reported on 06/02/2021)   metFORMIN (GLUCOPHAGE) 500 MG tablet Take 0.5 tablets (250 mg total) by mouth daily with breakfast.   simvastatin (ZOCOR) 20 MG tablet TAKE 1 AND 1/2 TABLETS(30 MG) BY MOUTH DAILY AT 6 PM   No facility-administered encounter medications  on file as of 10/03/2021.    Allergies (verified) Codeine and Sulfa antibiotics   History: Past Medical History:  Diagnosis Date   Acute bronchitis    Anxiety    Disorder of bone and cartilage, unspecified    Elevated blood pressure reading without diagnosis of hypertension    Myalgia and myositis, unspecified    Osteoporosis, unspecified    Other abnormal blood chemistry    Other and unspecified hyperlipidemia    Plantar fascial fibromatosis    Type II or unspecified type diabetes mellitus without mention of complication, not stated as uncontrolled    Past Surgical History:  Procedure Laterality Date   BREAST SURGERY  04/17/1995   reduction   CATARACT EXTRACTION Right 04/16/2013   CATARACT EXTRACTION Left 04/16/2014   EYE SURGERY     Left eye contracts removed   Family History  Problem Relation Age of Onset   Dementia Mother    Memory loss Sister    Dementia Sister    Social History   Socioeconomic History   Marital status: Married    Spouse name: Not on file   Number of children: Not on file   Years of education: Not on file   Highest education level: Not on file  Occupational History   Not on file  Tobacco Use   Smoking  status: Never   Smokeless tobacco: Never  Vaping Use   Vaping Use: Never used  Substance and Sexual Activity   Alcohol use: Yes    Comment: glass wine once a week   Drug use: No   Sexual activity: Not Currently  Other Topics Concern   Not on file  Social History Narrative   Not on file   Social Determinants of Health   Financial Resource Strain: Low Risk  (09/13/2017)   Overall Financial Resource Strain (CARDIA)    Difficulty of Paying Living Expenses: Not hard at all  Food Insecurity: No Food Insecurity (09/13/2017)   Hunger Vital Sign    Worried About Running Out of Food in the Last Year: Never true    Ran Out of Food in the Last Year: Never true  Transportation Needs: No Transportation Needs (09/13/2017)   PRAPARE -  Hydrologist (Medical): No    Lack of Transportation (Non-Medical): No  Physical Activity: Sufficiently Active (09/13/2017)   Exercise Vital Sign    Days of Exercise per Week: 7 days    Minutes of Exercise per Session: 60 min  Stress: No Stress Concern Present (09/13/2017)   [place]    Feeling of Stress : Not at all  Social Connections: Socially Integrated (09/13/2017)   Social Connection and Isolation Panel [NHANES]    Frequency of Communication with Friends and Family: More than three times a week    Frequency of Social Gatherings with Friends and Family: More than three times a week    Attends Religious Services: More than 4 times per year    Active Member of [employer] or Organizations: Yes    Attends Music therapist: More than 4 times per year    Marital Status: Married    Tobacco Counseling Counseling given: Not Answered   Clinical Intake:                 Diabetic?yes         Activities of Daily Living     No data to display          Patient Care Team: [name], NP as PCP - General (Nurse Practitioner) Delsa Sale, [place] (Optometry) Marica Otter, OD (Optometry)  Indicate any recent Medical Services you may have received from other than [facility] providers in the past year (date may be approximate).     Assessment:   This is a routine wellness examination for [place].  Hearing/Vision screen No results found.  Dietary issues and exercise activities discussed:     Goals Addressed   None    Depression Screen    06/02/2021    9:33 AM 09/29/2020    8:41 AM 08/05/2020    8:42 AM 01/22/2020    9:39 AM 09/24/2019    8:56 AM 03/20/2019    9:13 AM 09/19/2018    4:47 PM  PHQ 2/9 Scores  PHQ - 2 Score 0 0 0 0 0 0 0    Fall Risk    06/02/2021    9:33 AM 01/27/2021    9:00 AM 09/29/2020    8:40 AM 08/05/2020    8:42 AM 01/22/2020    9:38  AM  Fall Risk   Falls in the past year? 0 1 1 0 1  Comment  Fell on ladder at work     Number falls in past yr: 0 0 0 0 0  Injury  with Fall? 0 0 0 0 1  Risk for fall due to : No Fall Risks No Fall Risks No Fall Risks    Follow up Falls evaluation completed Falls evaluation completed Falls evaluation completed      [place]:  Any stairs in or around the home? {YES/NO:21197} If so, are there any without handrails? {YES/NO:21197} Home free of loose throw rugs in walkways, pet beds, electrical cords, etc? {YES/NO:21197} Adequate lighting in your home to reduce risk of falls? {YES/NO:21197}  ASSISTIVE DEVICES UTILIZED TO PREVENT FALLS:  Life alert? {YES/NO:21197} Use of a cane, walker or w/c? {YES/NO:21197} Grab bars in the bathroom? {YES/NO:21197} Shower chair or bench in shower? {YES/NO:21197} Elevated toilet seat or a handicapped toilet? {YES/NO:21197}  TIMED UP AND GO:  Was the test performed? No .   Cognitive Function:    09/13/2017    8:48 AM 08/16/2016    9:57 AM  MMSE - Mini Mental State Exam  Orientation to time 4 5  Orientation to Place 4 5  Registration 3 3  Attention/ Calculation 5 5  Recall 2 2  Language- name 2 objects 2 2  Language- repeat 1 1  Language- follow 3 step command 3 3  Language- read & follow direction 1 1  Write a sentence 1 1  Copy design 1 1  Total score 27 29        09/29/2020    8:45 AM 09/24/2019    8:59 AM 09/19/2018    4:47 PM  6CIT Screen  What Year? 0 points 0 points 0 points  What month? 0 points 0 points 0 points  What time? 0 points 0 points 0 points  Count back from 20 0 points 0 points 0 points  Months in reverse 0 points 0 points 0 points  Repeat phrase 0 points 0 points 0 points  Total Score 0 points 0 points 0 points    Immunizations Immunization History  Administered Date(s) Administered   Fluad Quad(high Dose 65+) 01/27/2021   Influenza, High Dose Seasonal PF 01/29/2019    Influenza, Quadrivalent, Recombinant, Inj, Pf 02/13/2018   Influenza-Unspecified 02/14/2015   PFIZER(Purple Top)SARS-COV-2 Vaccination 05/29/2019, 06/23/2019   Pneumococcal Conjugate-13 10/22/2013   Pneumococcal Polysaccharide-23 08/16/2016, 03/20/2019   Td 04/24/2009   Tdap 10/07/2016   Zoster Recombinat (Shingrix) 10/19/2017, 01/17/2018    TDAP status: Up to date  Flu Vaccine status: Up to date  Pneumococcal vaccine status: Up to date  Covid-19 vaccine status: Information provided on how to obtain vaccines.   Qualifies for Shingles Vaccine? Yes   Zostavax completed Yes   Shingrix Completed?: Yes  Screening Tests Health Maintenance  Topic Date Due   INFLUENZA VACCINE  11/14/2021   HEMOGLOBIN A1C  11/21/2021   FOOT EXAM  12/02/2021   URINE MICROALBUMIN  01/19/2022   OPHTHALMOLOGY EXAM  08/22/2022   COLONOSCOPY (Pts 45-72yr Insurance coverage will need to be confirmed)  06/29/2026   TETANUS/TDAP  10/08/2026   Pneumonia Vaccine 76 Years old  Completed   DEXA SCAN  Completed   Hepatitis C Screening  Completed   Zoster Vaccines- Shingrix  Completed   HPV VACCINES  Aged Out   COVID-19 Vaccine  Discontinued    Health Maintenance  There are no preventive care reminders to display for this patient.  Colorectal cancer screening: Type of screening: Colonoscopy. Completed 2018. Repeat every 10 years  Mammogram status: Completed 05/25/2021. Repeat every year  Bone Density status: Completed 05/25/2021. Results reflect:  Bone density results: OSTEOPENIA. Repeat every 2 years. / Lung Cancer Screening: (Low Dose CT Chest recommended if Age 3-80 years, 30 pack-year currently smoking OR have quit w/in 15years.) does not qualify.   Lung Cancer Screening Referral: na  Additional Screening:  Hepatitis C Screening: does qualify; Completed 2021   Vision Screening: Recommended annual ophthalmology exams for early detection of glaucoma and other disorders of the eye. Is the patient  up to date with their annual eye exam?  Yes  Who is the provider or what is the name of the office in which the patient attends annual eye exams? [facility] If pt is not established with a provider, would they like to be referred to a provider to establish care? No .   Dental Screening: Recommended annual dental exams for proper oral hygiene  Community Resource Referral / Chronic Care Management: CRR required this visit?  No   CCM required this visit?  No      Plan:     I have personally reviewed and noted the following in the patient's chart:   Medical and social history Use of alcohol, tobacco or illicit drugs  Current medications and supplements including opioid prescriptions.  Functional ability and status Nutritional status Physical activity Advanced directives List of other physicians Hospitalizations, surgeries, and ER visits in previous 12 months Vitals Screenings to include cognitive, depression, and falls Referrals and appointments  In addition, I have reviewed and discussed with patient certain preventive protocols, quality metrics, and best practice recommendations. A written personalized care plan for preventive services as well as general preventive health recommendations were provided to patient.     [name], NP   10/02/2021   Virtual Visit via Telephone Note  I connected with patient 10/02/21 at  9:00 AM EDT by telephone and verified that I am speaking with the correct person using two identifiers.  Location: Patient: home Provider: [facility]   I discussed the limitations, risks, security and privacy concerns of performing an evaluation and management service by telephone and the availability of in person appointments. I also discussed with the patient that there may be a patient responsible charge related to this service. The patient expressed understanding and agreed to proceed.   I discussed the assessment and treatment plan with the patient.  The patient was provided an opportunity to ask questions and all were answered. The patient agreed with the plan and demonstrated an understanding of the instructions.   The patient was advised to call back or seek an in-person evaluation if the symptoms worsen or if the condition fails to improve as anticipated.  I provided 15 minutes of non-face-to-face time during this encounter.  Carlos American. Harle Battiest Avs printed and mailed

## 2021-10-03 ENCOUNTER — Ambulatory Visit (INDEPENDENT_AMBULATORY_CARE_PROVIDER_SITE_OTHER): Payer: PPO | Admitting: Nurse Practitioner

## 2021-10-03 ENCOUNTER — Other Ambulatory Visit: Payer: Self-pay | Admitting: Nurse Practitioner

## 2021-10-03 ENCOUNTER — Telehealth: Payer: Self-pay | Admitting: *Deleted

## 2021-10-03 DIAGNOSIS — H18413 Arcus senilis, bilateral: Secondary | ICD-10-CM | POA: Diagnosis not present

## 2021-10-03 DIAGNOSIS — H26491 Other secondary cataract, right eye: Secondary | ICD-10-CM | POA: Diagnosis not present

## 2021-10-03 DIAGNOSIS — Z961 Presence of intraocular lens: Secondary | ICD-10-CM | POA: Diagnosis not present

## 2021-10-03 DIAGNOSIS — E785 Hyperlipidemia, unspecified: Secondary | ICD-10-CM

## 2021-10-03 DIAGNOSIS — E119 Type 2 diabetes mellitus without complications: Secondary | ICD-10-CM | POA: Diagnosis not present

## 2021-10-03 DIAGNOSIS — Z0000 Encounter for general adult medical examination without abnormal findings: Secondary | ICD-10-CM | POA: Diagnosis not present

## 2021-10-03 DIAGNOSIS — I10 Essential (primary) hypertension: Secondary | ICD-10-CM

## 2021-10-03 DIAGNOSIS — E118 Type 2 diabetes mellitus with unspecified complications: Secondary | ICD-10-CM

## 2021-10-03 NOTE — Telephone Encounter (Signed)
Ms. naziyah, tieszen are scheduled for a virtual visit with your provider today.    Just as we do with appointments in the office, we must obtain your consent to participate.  Your consent will be active for this visit and any virtual visit you Deny Chevez have with one of our providers in the next 365 days.    If you have a MyChart account, I can also send a copy of this consent to you electronically.  All virtual visits are billed to your insurance company just like a traditional visit in the office.  As this is a virtual visit, video technology does not allow for your provider to perform a traditional examination.  This [place] limit your provider's ability to fully assess your condition.  If your provider identifies any concerns that need to be evaluated in person or the need to arrange testing such as labs, EKG, etc, we will make arrangements to do so.    Although advances in technology are sophisticated, we cannot ensure that it will always work on either your end or our end.  If the connection with a video visit is poor, we Jaimes Eckert have to switch to a telephone visit.  With either a video or telephone visit, we are not always able to ensure that we have a secure connection.   I need to obtain your verbal consent now.   Are you willing to proceed with your visit today?   JAZMON KOS has provided verbal consent on 10/03/2021 for a virtual visit (video or telephone).   MayAlbertina [place], [place] 10/03/2021  9:00 AM

## 2021-10-04 ENCOUNTER — Other Ambulatory Visit: Payer: PPO

## 2021-10-04 DIAGNOSIS — E785 Hyperlipidemia, unspecified: Secondary | ICD-10-CM | POA: Diagnosis not present

## 2021-10-04 DIAGNOSIS — I10 Essential (primary) hypertension: Secondary | ICD-10-CM | POA: Diagnosis not present

## 2021-10-04 DIAGNOSIS — E118 Type 2 diabetes mellitus with unspecified complications: Secondary | ICD-10-CM | POA: Diagnosis not present

## 2021-10-05 LAB — CBC WITH DIFFERENTIAL/PLATELET
Absolute Monocytes: 580 cells/uL (ref 200–950)
Basophils Absolute: 50 cells/uL (ref 0–200)
Basophils Relative: 1.2 %
Eosinophils Absolute: 88 cells/uL (ref 15–500)
Eosinophils Relative: 2.1 %
HCT: 40.6 % (ref 35.0–45.0)
Hemoglobin: 13.2 g/dL (ref 11.7–15.5)
Lymphs Abs: 1667 cells/uL (ref 850–3900)
MCH: 28.3 pg (ref 27.0–33.0)
MCHC: 32.5 g/dL (ref 32.0–36.0)
MCV: 86.9 fL (ref 80.0–100.0)
MPV: 9.4 fL (ref 7.5–12.5)
Monocytes Relative: 13.8 %
Neutro Abs: 1814 cells/uL (ref 1500–7800)
Neutrophils Relative %: 43.2 %
Platelets: 248 10*3/uL (ref 140–400)
RBC: 4.67 10*6/uL (ref 3.80–5.10)
RDW: 13.3 % (ref 11.0–15.0)
Total Lymphocyte: 39.7 %
WBC: 4.2 10*3/uL (ref 3.8–10.8)

## 2021-10-05 LAB — LIPID PANEL
Cholesterol: 143 mg/dL (ref <200)
HDL: 45 mg/dL — ABNORMAL LOW (ref >=50)
LDL Cholesterol (Calc): 81 mg/dL (calc)
Non-HDL Cholesterol (Calc): 98 mg/dL (calc) (ref <130)
Total CHOL/HDL Ratio: 3.2 (calc) (ref <5.0)
Triglycerides: 87 mg/dL (ref <150)

## 2021-10-05 LAB — HEMOGLOBIN A1C
Hgb A1c MFr Bld: 5.8 % of total Hgb — ABNORMAL HIGH (ref <5.7)
Mean Plasma Glucose: 120 mg/dL
eAG (mmol/L): 6.6 mmol/L

## 2021-10-05 LAB — COMPLETE METABOLIC PANEL WITH GFR
AG Ratio: 1.5 (calc) (ref 1.0–2.5)
ALT: 16 U/L (ref 6–29)
AST: 18 U/L (ref 10–35)
Albumin: 4.4 g/dL (ref 3.6–5.1)
Alkaline phosphatase (APISO): 84 U/L (ref 37–153)
BUN: 13 mg/dL (ref 7–25)
CO2: 25 mmol/L (ref 20–32)
Calcium: 9.7 mg/dL (ref 8.6–10.4)
Chloride: 106 mmol/L (ref 98–110)
Creat: 0.75 mg/dL (ref 0.60–1.00)
Globulin: 2.9 g/dL (calc) (ref 1.9–3.7)
Glucose, Bld: 95 mg/dL (ref 65–99)
Potassium: 4.2 mmol/L (ref 3.5–5.3)
Sodium: 141 mmol/L (ref 135–146)
Total Bilirubin: 0.3 mg/dL (ref 0.2–1.2)
Total Protein: 7.3 g/dL (ref 6.1–8.1)
eGFR: 83 mL/min/{1.73_m2} (ref >=60)

## 2021-10-06 ENCOUNTER — Encounter: Payer: Self-pay | Admitting: Nurse Practitioner

## 2021-10-06 ENCOUNTER — Ambulatory Visit (INDEPENDENT_AMBULATORY_CARE_PROVIDER_SITE_OTHER): Payer: PPO | Admitting: Nurse Practitioner

## 2021-10-06 VITALS — BP 140/90 | HR 85 | Temp 97.3°F | Ht 61.5 in | Wt 151.0 lb

## 2021-10-06 DIAGNOSIS — E785 Hyperlipidemia, unspecified: Secondary | ICD-10-CM

## 2021-10-06 DIAGNOSIS — E119 Type 2 diabetes mellitus without complications: Secondary | ICD-10-CM | POA: Diagnosis not present

## 2021-10-06 DIAGNOSIS — M810 Age-related osteoporosis without current pathological fracture: Secondary | ICD-10-CM | POA: Diagnosis not present

## 2021-10-06 DIAGNOSIS — I10 Essential (primary) hypertension: Secondary | ICD-10-CM

## 2021-10-07 LAB — MICROALBUMIN / CREATININE URINE RATIO
Creatinine, Urine: 43 mg/dL (ref 20–275)
Microalb Creat Ratio: 7 mcg/mg creat (ref <30)
Microalb, Ur: 0.3 mg/dL

## 2021-10-10 DIAGNOSIS — H26491 Other secondary cataract, right eye: Secondary | ICD-10-CM | POA: Diagnosis not present

## 2021-11-24 DIAGNOSIS — Z0110 Encounter for examination of ears and hearing without abnormal findings: Secondary | ICD-10-CM | POA: Diagnosis not present

## 2021-12-06 DIAGNOSIS — M8580 Other specified disorders of bone density and structure, unspecified site: Secondary | ICD-10-CM | POA: Diagnosis not present

## 2021-12-08 ENCOUNTER — Ambulatory Visit (INDEPENDENT_AMBULATORY_CARE_PROVIDER_SITE_OTHER): Payer: PPO | Admitting: Podiatry

## 2021-12-08 ENCOUNTER — Encounter: Payer: Self-pay | Admitting: Podiatry

## 2021-12-08 DIAGNOSIS — E119 Type 2 diabetes mellitus without complications: Secondary | ICD-10-CM

## 2021-12-08 DIAGNOSIS — M21539 Acquired clawfoot, unspecified foot: Secondary | ICD-10-CM

## 2021-12-14 NOTE — Progress Notes (Signed)
ANNUAL DIABETIC FOOT EXAM  Subjective: Jasmine James presents today for annual diabetic foot examination.  Patient confirms h/o diabetes.  Patient relates 6 year h/o diabetes.  Patient denies any h/o foot wounds.  Patient denies any numbness, tingling, burning, or pins/needle sensation in feet.  Last known  HgA1c was 5.7%. Patient did not check blood glucose this morning.  She states she and her husband have both retired and are enjoying their retirement. She still walks 3 miles/day for exercise.  Risk factors: diabetes, HTN, hyperlipidemia.  [name], NP is patient's PCP. Last visit was October 06, 2021.  Past Medical History:  Diagnosis Date   Acute bronchitis    Anxiety    Disorder of bone and cartilage, unspecified    Elevated blood pressure reading without diagnosis of hypertension    Myalgia and myositis, unspecified    Osteoporosis, unspecified    Other abnormal blood chemistry    Other and unspecified hyperlipidemia    Plantar fascial fibromatosis    Type II or unspecified type diabetes mellitus without mention of complication, not stated as uncontrolled    Patient Active Problem List   Diagnosis Date Noted   Osteoporosis 02/27/2019   Plantar fasciitis of left foot 01/29/2018   Controlled type 2 diabetes mellitus without complication, without long-term current use of insulin ([place]) 07/21/2015   Hyperlipidemia LDL goal <70 07/21/2015   Essential hypertension, benign 07/21/2015   Past Surgical History:  Procedure Laterality Date   BREAST SURGERY  04/17/1995   reduction   CATARACT EXTRACTION Right 04/16/2013   CATARACT EXTRACTION Left 04/16/2014   EYE SURGERY     Left eye contracts removed   Current Outpatient Medications on File Prior to Visit  Medication Sig Dispense Refill   simvastatin (ZOCOR) 20 MG tablet Take by mouth.     acetaminophen (TYLENOL) 650 MG CR tablet Take 1,300 mg by mouth daily as needed for pain.     Blood Glucose Monitoring  Suppl (ACCU-CHEK AVIVA) device 1 each by Other route daily. Check blood sugar once daily 1 each 0   Cholecalciferol (VITAMIN D) 125 MCG (5000 UT) CAPS Take 3 capsules by mouth daily.     Continuous Blood Gluc Receiver (FREESTYLE LIBRE 2 READER) DEVI 1 Device by Does not apply route daily at 12 noon. 1 each 0   Continuous Blood Gluc Sensor (FREESTYLE LIBRE 2 SENSOR) MISC 2 Devices by Does not apply route daily. 2 each 12   glucose blood (ACCU-CHEK AVIVA PLUS) test strip 1 each by Other route daily. 100 each 5   Lancets (ACCU-CHEK SOFT TOUCH) lancets 1 each by Other route daily. 100 each 5   metFORMIN (GLUCOPHAGE) 500 MG tablet Take 0.5 tablets (250 mg total) by mouth daily with breakfast. 45 tablet 1   prednisoLONE acetate (PRED FORTE) 1 % ophthalmic suspension      No current facility-administered medications on file prior to visit.    Allergies  Allergen Reactions   Codeine    Sulfa Antibiotics    Social History   Occupational History   Not on file  Tobacco Use   Smoking status: Never   Smokeless tobacco: Never  Vaping Use   Vaping Use: Never used  Substance and Sexual Activity   Alcohol use: Yes    Comment: glass wine once a week   Drug use: No   Sexual activity: Not Currently   Family History  Problem Relation Age of Onset   Dementia Mother    Memory loss  Sister    Dementia Sister    Immunization History  Administered Date(s) Administered   Fluad Quad(high Dose 65+) 01/27/2021   Influenza, High Dose Seasonal PF 01/29/2019   Influenza, Quadrivalent, Recombinant, Inj, Pf 02/13/2018   Influenza-Unspecified 02/14/2015   PFIZER(Purple Top)SARS-COV-2 Vaccination 05/29/2019, 06/23/2019   Pneumococcal Conjugate-13 10/22/2013   Pneumococcal Polysaccharide-23 08/16/2016, 03/20/2019   Td 04/24/2009   Tdap 10/07/2016   Zoster Recombinat (Shingrix) 10/19/2017, 01/17/2018     Review of Systems: Negative except as noted in the HPI.   Objective: There were no vitals filed for  this visit.  Jasmine James is a pleasant 76 y.o. female in NAD. AAO X 3.  Vascular Examination: Vascular status intact b/l with palpable pedal pulses. Pedal hair present b/l. CFT immediate b/l. No edema. No pain with calf compression b/l. Skin temperature gradient WNL b/l.   Neurological Examination: Sensation grossly intact b/l with 10 gram monofilament. Vibratory sensation intact b/l. Proprioception intact bilaterally.  Dermatological Examination: Pedal skin with normal turgor, texture and tone b/l. No hyperkeratotic lesions noted b/l. Toenails 1-5 b/l well maintained with adequate length. No erythema, no edema, no drainage, no fluctuance.  Musculoskeletal Examination: Muscle strength 5/5 to b/l LE. Clawtoe deformity bilateral 2nd toes and bilateral 3rd toes. Patient ambulates independent of any assistive aids.  Radiographs: None  Last A1c:      Latest Ref Rng & Units 10/04/2021    8:10 AM 05/24/2021    8:20 AM 01/19/2021    8:50 AM  Hemoglobin A1C  Hemoglobin-A1c <5.7 % of total Hgb 5.8  5.8  5.4    Footwear Assessment: Does the patient wear appropriate shoes? Yes. Does the patient need inserts/orthotics? No.  ADA Risk Categorization: Low Risk :  Patient has all of the following: Intact protective sensation No prior foot ulcer  No severe deformity Pedal pulses present  Assessment: 1. Acquired clawfoot, unspecified laterality   2. Controlled type 2 diabetes mellitus without complication, without long-term current use of insulin ([place])   3. Encounter for diabetic foot exam [facility])    Plan: -Patient was evaluated and treated. All patient's and/or POA's questions/concerns answered on today's visit. -Diabetic foot examination performed today. -Stressed the importance of good glycemic control and the detriment of not  controlling glucose levels in relation to the foot. -Patient/POA to call should there be question/concern in the interim. Return in about 1 year (around  12/09/2022).  Marzetta Board, DPM

## 2021-12-27 ENCOUNTER — Other Ambulatory Visit: Payer: Self-pay | Admitting: Nurse Practitioner

## 2021-12-27 DIAGNOSIS — E119 Type 2 diabetes mellitus without complications: Secondary | ICD-10-CM

## 2021-12-28 NOTE — Telephone Encounter (Signed)
Has an appt in January

## 2021-12-29 ENCOUNTER — Other Ambulatory Visit: Payer: Self-pay | Admitting: Nurse Practitioner

## 2021-12-29 NOTE — Telephone Encounter (Signed)
Patient has request refill on medication Simvastatin '20mg'$ . Patient medication hasnt been filled by PCP Dewaine Oats Carlos American, NP . Medication pend and sent to PCP for approval.

## 2022-01-09 ENCOUNTER — Encounter: Payer: Self-pay | Admitting: *Deleted

## 2022-01-09 NOTE — Progress Notes (Signed)
[facility] Quality Team Note  Name: Jasmine James Date of Birth: [birth year] MRN: [id number] Date: 01/09/2022  [facility] Quality Team has reviewed this patient's chart, please see recommendations below:  [facility] Quality Other; (Pt has open gap for blood pressure.  Called pt to offer bp fu.  Pt declined for now.  Informed pt to call if she changes her mind.  Would need blood pressure completed before end of 2023.  )

## 2022-01-16 ENCOUNTER — Other Ambulatory Visit: Payer: Self-pay | Admitting: Nurse Practitioner

## 2022-03-13 DIAGNOSIS — L218 Other seborrheic dermatitis: Secondary | ICD-10-CM | POA: Diagnosis not present

## 2022-03-13 DIAGNOSIS — B078 Other viral warts: Secondary | ICD-10-CM | POA: Diagnosis not present

## 2022-04-06 ENCOUNTER — Other Ambulatory Visit: Payer: Self-pay

## 2022-04-06 ENCOUNTER — Other Ambulatory Visit: Payer: Self-pay | Admitting: Nurse Practitioner

## 2022-04-06 DIAGNOSIS — E785 Hyperlipidemia, unspecified: Secondary | ICD-10-CM

## 2022-04-06 DIAGNOSIS — E119 Type 2 diabetes mellitus without complications: Secondary | ICD-10-CM

## 2022-04-06 DIAGNOSIS — I10 Essential (primary) hypertension: Secondary | ICD-10-CM

## 2022-04-11 DIAGNOSIS — H5203 Hypermetropia, bilateral: Secondary | ICD-10-CM | POA: Diagnosis not present

## 2022-04-11 DIAGNOSIS — H40013 Open angle with borderline findings, low risk, bilateral: Secondary | ICD-10-CM | POA: Diagnosis not present

## 2022-04-11 DIAGNOSIS — H40043 Steroid responder, bilateral: Secondary | ICD-10-CM | POA: Diagnosis not present

## 2022-04-11 DIAGNOSIS — H52223 Regular astigmatism, bilateral: Secondary | ICD-10-CM | POA: Diagnosis not present

## 2022-04-11 DIAGNOSIS — H524 Presbyopia: Secondary | ICD-10-CM | POA: Diagnosis not present

## 2022-04-11 DIAGNOSIS — H40053 Ocular hypertension, bilateral: Secondary | ICD-10-CM | POA: Diagnosis not present

## 2022-04-13 ENCOUNTER — Ambulatory Visit: Payer: PPO | Admitting: Nurse Practitioner

## 2022-04-17 ENCOUNTER — Other Ambulatory Visit: Payer: PPO

## 2022-04-17 DIAGNOSIS — E785 Hyperlipidemia, unspecified: Secondary | ICD-10-CM | POA: Diagnosis not present

## 2022-04-17 DIAGNOSIS — E119 Type 2 diabetes mellitus without complications: Secondary | ICD-10-CM | POA: Diagnosis not present

## 2022-04-17 DIAGNOSIS — I10 Essential (primary) hypertension: Secondary | ICD-10-CM | POA: Diagnosis not present

## 2022-04-17 LAB — COMPLETE METABOLIC PANEL WITH GFR
AG Ratio: 1.8 (calc) (ref 1.0–2.5)
ALT: 23 U/L (ref 6–29)
AST: 22 U/L (ref 10–35)
Albumin: 5 g/dL (ref 3.6–5.1)
Alkaline phosphatase (APISO): 82 U/L (ref 37–153)
BUN: 14 mg/dL (ref 7–25)
CO2: 25 mmol/L (ref 20–32)
Calcium: 9.9 mg/dL (ref 8.6–10.4)
Chloride: 104 mmol/L (ref 98–110)
Creat: 0.8 mg/dL (ref 0.60–1.00)
Globulin: 2.8 g/dL (calc) (ref 1.9–3.7)
Glucose, Bld: 112 mg/dL — ABNORMAL HIGH (ref 65–99)
Potassium: 3.9 mmol/L (ref 3.5–5.3)
Sodium: 140 mmol/L (ref 135–146)
Total Bilirubin: 0.6 mg/dL (ref 0.2–1.2)
Total Protein: 7.8 g/dL (ref 6.1–8.1)
eGFR: 76 mL/min/{1.73_m2} (ref >=60)

## 2022-04-17 LAB — LIPID PANEL
Cholesterol: 156 mg/dL (ref <200)
HDL: 44 mg/dL — ABNORMAL LOW (ref >=50)
LDL Cholesterol (Calc): 89 mg/dL (calc)
Non-HDL Cholesterol (Calc): 112 mg/dL (calc) (ref <130)
Total CHOL/HDL Ratio: 3.5 (calc) (ref <5.0)
Triglycerides: 133 mg/dL (ref <150)

## 2022-04-17 LAB — HEMOGLOBIN A1C
Hgb A1c MFr Bld: 6.1 % of total Hgb — ABNORMAL HIGH (ref <5.7)
Mean Plasma Glucose: 128 mg/dL
eAG (mmol/L): 7.1 mmol/L

## 2022-04-17 LAB — CBC WITH DIFFERENTIAL/PLATELET
Absolute Monocytes: 667 cells/uL (ref 200–950)
Basophils Absolute: 50 cells/uL (ref 0–200)
Basophils Relative: 0.7 %
Eosinophils Absolute: 99 cells/uL (ref 15–500)
Eosinophils Relative: 1.4 %
HCT: 42.5 % (ref 35.0–45.0)
Hemoglobin: 14.3 g/dL (ref 11.7–15.5)
Lymphs Abs: 3593 cells/uL (ref 850–3900)
MCH: 29 pg (ref 27.0–33.0)
MCHC: 33.6 g/dL (ref 32.0–36.0)
MCV: 86.2 fL (ref 80.0–100.0)
MPV: 9.7 fL (ref 7.5–12.5)
Monocytes Relative: 9.4 %
Neutro Abs: 2691 cells/uL (ref 1500–7800)
Neutrophils Relative %: 37.9 %
Platelets: 278 10*3/uL (ref 140–400)
RBC: 4.93 10*6/uL (ref 3.80–5.10)
RDW: 13.4 % (ref 11.0–15.0)
Total Lymphocyte: 50.6 %
WBC: 7.1 10*3/uL (ref 3.8–10.8)

## 2022-04-19 ENCOUNTER — Encounter: Payer: Self-pay | Admitting: Nurse Practitioner

## 2022-04-20 ENCOUNTER — Ambulatory Visit (INDEPENDENT_AMBULATORY_CARE_PROVIDER_SITE_OTHER): Payer: PPO | Admitting: Nurse Practitioner

## 2022-04-20 ENCOUNTER — Encounter: Payer: Self-pay | Admitting: Nurse Practitioner

## 2022-04-20 VITALS — BP 124/80 | HR 100 | Temp 97.9°F | Ht 61.5 in | Wt 150.0 lb

## 2022-04-20 DIAGNOSIS — E785 Hyperlipidemia, unspecified: Secondary | ICD-10-CM | POA: Diagnosis not present

## 2022-04-20 DIAGNOSIS — M810 Age-related osteoporosis without current pathological fracture: Secondary | ICD-10-CM

## 2022-04-20 DIAGNOSIS — I10 Essential (primary) hypertension: Secondary | ICD-10-CM

## 2022-04-20 DIAGNOSIS — E118 Type 2 diabetes mellitus with unspecified complications: Secondary | ICD-10-CM

## 2022-04-20 NOTE — Progress Notes (Signed)
Careteam: Patient Care Team: [name], NP as PCP - General (Nurse Practitioner) Delsa Sale, [place] (Optometry) Marica Otter, OD (Optometry)  PLACE OF SERVICE:  [place]  Advanced Directive information Does Patient Have a Medical Advance Directive?: Yes, Type of Advance Directive: Living will, Does patient want to make changes to medical advance directive?: No - Patient declined  Allergies  Allergen Reactions   Codeine    Sulfa Antibiotics     Chief Complaint  Patient presents with   Medical Management of Chronic Issues    6 month follow up and discuss labs. Refused flu vaccine recommendation.      HPI: Patient is a 77 y.o. female here for medical management of chronic diseases.  DM- recent A1C is slightly elevated  at 6.1 as is glucose of 112 on bloodwork. Patient exercises regularly and tries to make healthy choices with food generally but got off course during the holidays.  She brought a freestyle libre sample which she got for free and would like for [place] to put one on her. She checks her fasting blood sugars at home occasionally and they are in the low 120s. She doesn't like to check them regularly because she doesn't want to stick her finger. Was able to get a sample freestyle libre for free and wants [place] to help her with it. She just saw the podiatrist for a check-up and says it went well. She is aware of the importance of regular eye exams.  HTN- BP stable. She states they are usually 120s/60s at home. She checks it regularly.   OSTEO- pt walks often and takes vitamin D supplements.   HLD- well controlled on simvastatin.   Review of Systems:  Review of Systems  Constitutional:  Negative for chills, fever, malaise/fatigue and weight loss.  HENT:  Negative for congestion and sore throat.   Eyes:  Negative for blurred vision.  Respiratory:  Negative for cough, shortness of breath and wheezing.   Cardiovascular:  Negative for chest pain, palpitations and  leg swelling.  Gastrointestinal:  Negative for abdominal pain, blood in stool, constipation, diarrhea, heartburn, nausea and vomiting.  Genitourinary:  Negative for dysuria, frequency, hematuria and urgency.  Musculoskeletal:  Negative for falls and joint pain.  Skin:  Negative for rash.  Neurological:  Negative for dizziness, tingling and headaches.  Endo/Heme/Allergies:  Negative for polydipsia.  Psychiatric/Behavioral:  Negative for depression. The patient is not nervous/anxious.   ***  Past Medical History:  Diagnosis Date   Acute bronchitis    Anxiety    Disorder of bone and cartilage, unspecified    Elevated blood pressure reading without diagnosis of hypertension    Myalgia and myositis, unspecified    Osteoporosis, unspecified    Other abnormal blood chemistry    Other and unspecified hyperlipidemia    Plantar fascial fibromatosis    Type II or unspecified type diabetes mellitus without mention of complication, not stated as uncontrolled    Past Surgical History:  Procedure Laterality Date   BREAST SURGERY  04/17/1995   reduction   CATARACT EXTRACTION Right 04/16/2013   CATARACT EXTRACTION Left 04/16/2014   EYE SURGERY     Left eye contracts removed   EYE SURGERY Right 09/2021   Social History:   reports that she has never smoked. She has never used smokeless tobacco. She reports current alcohol use. She reports that she does not use drugs.  Family History  Problem Relation Age of Onset   Dementia Mother  Memory loss Sister    Dementia Sister     Medications: Patient's Medications  New Prescriptions   No medications on file  Previous Medications   ACETAMINOPHEN (TYLENOL) 650 MG CR TABLET    Take 1,300 mg by mouth daily as needed for pain.   BLOOD GLUCOSE MONITORING SUPPL (ACCU-CHEK AVIVA) DEVICE    1 each by Other route daily. Check blood sugar once daily   CHOLECALCIFEROL (VITAMIN D) 125 MCG (5000 UT) CAPS    Take 2 capsules by mouth daily.   CONTINUOUS  BLOOD GLUC RECEIVER (FREESTYLE LIBRE 2 READER) DEVI    1 Device by Does not apply route daily at 12 noon.   CONTINUOUS BLOOD GLUC SENSOR (FREESTYLE LIBRE 2 SENSOR) MISC    2 Devices by Does not apply route daily.   GLUCOSE BLOOD (ACCU-CHEK AVIVA PLUS) TEST STRIP    1 each by Other route daily.   LANCETS (ACCU-CHEK SOFT TOUCH) LANCETS    1 each by Other route daily.   METFORMIN (GLUCOPHAGE) 500 MG TABLET    TAKE 1/2 TABLET(250 MG) BY MOUTH DAILY WITH BREAKFAST   POLYETHYL GLYCOL-PROPYL GLYCOL (SYSTANE) 0.4-0.3 % SOLN    Apply 1 drop to eye as needed.   SIMVASTATIN (ZOCOR) 20 MG TABLET    TAKE 1 AND 1/2 TABLETS(30 MG) BY MOUTH DAILY AT 6 PM  Modified Medications   No medications on file  Discontinued Medications   PREDNISOLONE ACETATE (PRED FORTE) 1 % OPHTHALMIC SUSPENSION        Physical Exam:  Vitals:   04/19/22 1040  BP: 124/80  Pulse: 100  Temp: 97.9 F (36.6 C)  TempSrc: Temporal  SpO2: 97%  Weight: 150 lb (68 kg)  Height: 5' 1.5" (1.562 m)   Body mass index is 27.88 kg/m. Wt Readings from Last 3 Encounters:  04/19/22 150 lb (68 kg)  10/06/21 151 lb (68.5 kg)  06/02/21 151 lb (68.5 kg)    Physical Exam Constitutional:      General: She is not in acute distress.    Appearance: Normal appearance.  Cardiovascular:     Rate and Rhythm: Normal rate and regular rhythm.  Pulmonary:     Effort: No respiratory distress.     Breath sounds: Normal breath sounds.  Chest:  Breasts:    Right: No mass, skin change or tenderness.     Left: No mass, skin change or tenderness.  Abdominal:     General: Bowel sounds are normal. There is no distension.     Palpations: Abdomen is soft. There is no mass.     Tenderness: There is no abdominal tenderness. There is no guarding.  Musculoskeletal:     Cervical back: Neck supple.  Lymphadenopathy:     Cervical: No cervical adenopathy.     Upper Body:     Right upper body: No supraclavicular, axillary or pectoral adenopathy.     Left  upper body: No supraclavicular, axillary or pectoral adenopathy.  Skin:    General: Skin is warm and dry.  Neurological:     Mental Status: She is alert and oriented to person, place, and time.  Psychiatric:        Mood and Affect: Mood normal.  ***  Labs reviewed: Basic Metabolic Panel: Recent Labs    05/24/21 0820 10/04/21 0810 04/17/22 0817  NA 141 141 140  K 4.2 4.2 3.9  CL 104 106 104  CO2 '30 25 25  '$ GLUCOSE 92 95 112*  BUN 18 13 14  CREATININE 0.70 0.75 0.80  CALCIUM 9.7 9.7 9.9   Liver Function Tests: Recent Labs    05/24/21 0820 10/04/21 0810 04/17/22 0817  AST '20 18 22  '$ ALT '18 16 23  '$ BILITOT 0.7 0.3 0.6  PROT 7.2 7.3 7.8   No results for input(s): "LIPASE", "AMYLASE" in the last 8760 hours. No results for input(s): "AMMONIA" in the last 8760 hours. CBC: Recent Labs    10/04/21 0810 04/17/22 0817  WBC 4.2 7.1  NEUTROABS 1,814 2,691  HGB 13.2 14.3  HCT 40.6 42.5  MCV 86.9 86.2  PLT 248 278   Lipid Panel: Recent Labs    05/24/21 0820 10/04/21 0810 04/17/22 0817  CHOL 151 143 156  HDL 42* 45* 44*  LDLCALC 93 81 89  TRIG 72 87 133  CHOLHDL 3.6 3.2 3.5   TSH: No results for input(s): "TSH" in the last 8760 hours. A1C: Lab Results  Component Value Date   HGBA1C 6.1 (H) 04/17/2022     Assessment/Plan 1. Controlled type 2 diabetes mellitus with complication, without long-term current use of insulin ([place]) Discussed freestyle libre with patient and she states she would rather not use it if not needed due to being unsure of how to handle the new device and . Continue metformin, diet changes and exercise  2. Essential hypertension, benign Regularly checks BP at home 120s/60s per patient. Continue diet and lifestyle modifications.   3. Osteoporosis, unspecified osteoporosis type, unspecified pathological fracture presence Patient reports walking often. Encouraged further weight-bearing exercise and diet modifications. Continue Vitamin D  supplementation.   4. Hyperlipidemia LDL goal <70 Patient continues to walk for exercise. Encouraged patient to engage in exercise to raise heart rate to further increase HDL. Currently on simvastatin with cholesterol and LDL well-controlled. Continue simvastatin.   No follow-ups on file.: ***  Pearla Dubonnet, ACPCNP-S  Carlos American. [place], [facility] [phone]

## 2022-04-21 LAB — MICROALBUMIN / CREATININE URINE RATIO
Creatinine, Urine: 179 mg/dL (ref 20–275)
Microalb Creat Ratio: 6 mcg/mg creat (ref <30)
Microalb, Ur: 1.1 mg/dL

## 2022-05-28 DIAGNOSIS — Z124 Encounter for screening for malignant neoplasm of cervix: Secondary | ICD-10-CM | POA: Diagnosis not present

## 2022-05-28 DIAGNOSIS — Z1231 Encounter for screening mammogram for malignant neoplasm of breast: Secondary | ICD-10-CM | POA: Diagnosis not present

## 2022-05-28 DIAGNOSIS — Z6828 Body mass index (BMI) 28.0-28.9, adult: Secondary | ICD-10-CM | POA: Diagnosis not present

## 2022-05-28 LAB — HM MAMMOGRAPHY

## 2022-06-07 ENCOUNTER — Other Ambulatory Visit: Payer: Self-pay | Admitting: Nurse Practitioner

## 2022-06-07 DIAGNOSIS — E119 Type 2 diabetes mellitus without complications: Secondary | ICD-10-CM

## 2022-08-06 ENCOUNTER — Telehealth: Payer: Self-pay | Admitting: Pharmacist

## 2022-08-06 NOTE — Progress Notes (Signed)
Patient appearing on report for quality metrics.  Outreached patient to discuss medication management. Left voicemail for patient to return my call at their convenience.   Kent Riendeau, PharmD, BCPS Clinical Pharmacist [facility]  

## 2022-08-14 ENCOUNTER — Other Ambulatory Visit: Payer: PPO | Admitting: Pharmacist

## 2022-08-14 ENCOUNTER — Telehealth: Payer: Self-pay

## 2022-08-14 NOTE — Progress Notes (Signed)
08/14/2022 Name: Jasmine James MRN: [id number] DOB: Dec 21, 1945    Jasmine James is a 77 y.o. year old female who presented for a telephone visit.   They were referred to the pharmacist by a quality report for assistance in managing  quality metrics: controlling blood pressure (CBP) .   Subjective:  Care Team: Primary Care Provider: [name], NP ; Next Scheduled Visit: 10/12/22   Medication Access/Adherence  Current Pharmacy:  [facility] [phone] - [place], [place] - [address] AT [facility] [address] [place] [address] Phone: [phone] Fax: [phone]   Patient reports affordability concerns with their medications: No  Patient reports access/transportation concerns to their pharmacy: No  Patient reports adherence concerns with their medications:  No     Hypertension:  Current medications: none, lifestyle management only at this time  Patient has a validated, automated, upper arm home BP cuff Current blood pressure readings readings: 120/60s  Patient denies hypotensive s/sx including dizziness, lightheadedness.  Patient denies hypertensive symptoms including headache, chest pain, shortness of breath   Objective:  Lab Results  Component Value Date   HGBA1C 6.1 (H) 04/17/2022    Lab Results  Component Value Date   CREATININE 0.80 04/17/2022   BUN 14 04/17/2022   NA 140 04/17/2022   K 3.9 04/17/2022   CL 104 04/17/2022   CO2 25 04/17/2022    Lab Results  Component Value Date   CHOL 156 04/17/2022   HDL 44 (L) 04/17/2022   LDLCALC 89 04/17/2022   TRIG 133 04/17/2022   CHOLHDL 3.5 04/17/2022    Medications Reviewed Today     Reviewed by Maurice Small, CMA (Certified Medical Assistant) on 04/20/22 at 1358  Med List Status: <None>   Medication Order Taking? Sig Documenting Provider Last Dose Status Informant  acetaminophen (TYLENOL) 650 MG CR tablet [id number] Yes Take  1,300 mg by mouth daily as needed for pain. [provider] Taking Active   Blood Glucose Monitoring Suppl (ACCU-CHEK AVIVA) device [id number] Yes 1 each by Other route daily. Check blood sugar once daily Ngetich, Dinah C, NP Taking Active   Cholecalciferol (VITAMIN D) 125 MCG (5000 UT) CAPS [id number] Yes Take 2 capsules by mouth daily. [provider] Taking Active   Continuous Blood Gluc Receiver (FREESTYLE LIBRE 2 READER) DEVI [id number] Yes 1 Device by Does not apply route daily at 12 noon. [name], NP Taking Active   Continuous Blood Gluc Sensor (FREESTYLE LIBRE 2 SENSOR) [place] [id number] Yes 2 Devices by Does not apply route daily. [name], NP Taking Active   glucose blood (ACCU-CHEK AVIVA PLUS) test strip [id number] Yes 1 each by Other route daily. Ngetich, Donalee Citrin, NP Taking Active   Lancets (ACCU-CHEK SOFT TOUCH) lancets [id number] Yes 1 each by Other route daily. Ngetich, Dinah C, NP Taking Active   metFORMIN (GLUCOPHAGE) 500 MG tablet [id number] Yes TAKE 1/2 TABLET(250 MG) BY MOUTH DAILY WITH BREAKFAST Janyth Contes, Janene Harvey, NP Taking Active   Polyethyl Glycol-Propyl Glycol (SYSTANE) 0.4-0.3 % SOLN [id number] Yes Apply 1 drop to eye as needed. [provider] Taking Active   simvastatin (ZOCOR) 20 MG tablet [id number] Yes TAKE 1 AND 1/2 TABLETS(30 MG) BY MOUTH DAILY AT 6 PM Eubanks, Janene Harvey, NP Taking Active               Assessment/Plan:   Hypertension: - Currently controlled - Reviewed long term cardiovascular and renal outcomes of uncontrolled blood  pressure - Reviewed appropriate blood pressure monitoring technique and reviewed goal blood pressure. Recommended to check home blood pressure and heart rate periodically - Recommend to continue current lifestyle      Lynnda Shields, PharmD, BCPS Clinical Pharmacist [facility]

## 2022-08-14 NOTE — Patient Instructions (Signed)
Merrillyn,  Thank you so much for calling back! As discussed, keep up the great work with walking, checking blood pressure periodically, and doing well with your other routine medications.  Have a wonderful rest of your week,  Elmarie Shiley, PharmD, BCPS Clinical Pharmacist [facility]

## 2022-08-14 NOTE — Telephone Encounter (Signed)
Patient returned call to our office,but I didn't have any contact information to give patient to return call. Please give patient a call back.

## 2022-08-27 DIAGNOSIS — L814 Other melanin hyperpigmentation: Secondary | ICD-10-CM | POA: Diagnosis not present

## 2022-08-27 DIAGNOSIS — L821 Other seborrheic keratosis: Secondary | ICD-10-CM | POA: Diagnosis not present

## 2022-08-27 DIAGNOSIS — B078 Other viral warts: Secondary | ICD-10-CM | POA: Diagnosis not present

## 2022-08-27 DIAGNOSIS — D485 Neoplasm of uncertain behavior of skin: Secondary | ICD-10-CM | POA: Diagnosis not present

## 2022-08-27 DIAGNOSIS — L570 Actinic keratosis: Secondary | ICD-10-CM | POA: Diagnosis not present

## 2022-10-09 ENCOUNTER — Encounter: Payer: PPO | Admitting: Nurse Practitioner

## 2022-10-11 ENCOUNTER — Encounter: Payer: PPO | Admitting: Nurse Practitioner

## 2022-10-12 ENCOUNTER — Encounter: Payer: Self-pay | Admitting: Nurse Practitioner

## 2022-10-12 ENCOUNTER — Ambulatory Visit (INDEPENDENT_AMBULATORY_CARE_PROVIDER_SITE_OTHER): Payer: PPO | Admitting: Nurse Practitioner

## 2022-10-12 VITALS — BP 138/96 | HR 90 | Temp 97.9°F | Ht 61.5 in | Wt 149.0 lb

## 2022-10-12 DIAGNOSIS — Z0000 Encounter for general adult medical examination without abnormal findings: Secondary | ICD-10-CM

## 2022-10-12 NOTE — Patient Instructions (Signed)
  Ms. Baudoin , Thank you for taking time to come for your Medicare Wellness Visit. I appreciate your ongoing commitment to your health goals. Please review the following plan we discussed and let me know if I can assist you in the future.   These are the goals we discussed:  Goals      Patient Stated     To take care of herself and see her great grandchildren     Stress reliever       I will focus on myself and stress less.        This is a list of the screening recommended for you and due dates:  Health Maintenance  Topic Date Due   Eye exam for diabetics  08/22/2022   Hemoglobin A1C  10/16/2022   Flu Shot  11/15/2022   Complete foot exam   12/09/2022   Yearly kidney function blood test for diabetes  04/18/2023   Yearly kidney health urinalysis for diabetes  04/21/2023   Medicare Annual Wellness Visit  10/12/2023   DTaP/Tdap/Td vaccine (3 - Td or Tdap) 10/08/2026   Pneumonia Vaccine  Completed   DEXA scan (bone density measurement)  Completed   Hepatitis C Screening  Completed   Zoster (Shingles) Vaccine  Completed   HPV Vaccine  Aged Out   Colon Cancer Screening  Discontinued   COVID-19 Vaccine  Discontinued

## 2022-10-12 NOTE — Progress Notes (Signed)
Subjective:   Jasmine James is a 77 y.o. female who presents for Medicare Annual (Subsequent) preventive examination.  Visit Complete: In person  Patient Medicare AWV questionnaire was completed by the patient on 10/12/22; I have confirmed that all information answered by patient is correct and no changes since this date.  Review of Systems     Cardiac Risk Factors include: advanced age (>67men, >64 women);diabetes mellitus;dyslipidemia     Objective:    Today's Vitals   10/12/22 0903 10/12/22 0908  BP: (!) 138/92 (!) 138/96  Pulse: 90   Temp: 97.9 F (36.6 C)   TempSrc: Temporal   SpO2: 95%   Weight: 149 lb (67.6 kg)   Height: 5' 1.5" (1.562 m)    Body mass index is 27.7 kg/m.     10/12/2022    8:45 AM 04/20/2022    2:00 PM 10/03/2021    9:03 AM 09/29/2020    8:42 AM 08/05/2020    8:42 AM 01/22/2020    9:38 AM 09/24/2019    8:59 AM  Advanced Directives  Does Patient Have a Medical Advance Directive? Yes Yes Yes Yes Yes Yes Yes  Type of Advance Directive Living will Living will Living will Living will Living will  Living will  Does patient want to make changes to medical advance directive? No - Patient declined No - Patient declined No - Patient declined No - Patient declined No - Patient declined  No - Patient declined    Current Medications (verified) Outpatient Encounter Medications as of 10/12/2022  Medication Sig   acetaminophen (TYLENOL) 650 MG CR tablet Take 1,300 mg by mouth daily as needed for pain.   Blood Glucose Monitoring Suppl (ACCU-CHEK AVIVA) device 1 each by Other route daily. Check blood sugar once daily   Cholecalciferol (VITAMIN D) 125 MCG (5000 UT) CAPS Take 2 capsules by mouth daily.   glucose blood (ACCU-CHEK AVIVA PLUS) test strip 1 each by Other route daily.   Lancets (ACCU-CHEK SOFT TOUCH) lancets 1 each by Other route daily.   metFORMIN (GLUCOPHAGE) 500 MG tablet TAKE 1/2 TABLET(250 MG) BY MOUTH DAILY WITH BREAKFAST   Polyethyl Glycol-Propyl  Glycol (SYSTANE) 0.4-0.3 % SOLN Apply 1 drop to eye daily.   simvastatin (ZOCOR) 20 MG tablet TAKE 1 AND 1/2 TABLETS(30 MG) BY MOUTH DAILY AT 6 PM   [DISCONTINUED] Continuous Blood Gluc Receiver (FREESTYLE LIBRE 2 READER) DEVI 1 Device by Does not apply route daily at 12 noon.   [DISCONTINUED] Continuous Blood Gluc Sensor (FREESTYLE LIBRE 2 SENSOR) MISC 2 Devices by Does not apply route daily.   No facility-administered encounter medications on file as of 10/12/2022.    Allergies (verified) Codeine and Sulfa antibiotics   History: Past Medical History:  Diagnosis Date   Acute bronchitis    Anxiety    Disorder of bone and cartilage, unspecified    Elevated blood pressure reading without diagnosis of hypertension    Myalgia and myositis, unspecified    Osteoporosis, unspecified    Other abnormal blood chemistry    Other and unspecified hyperlipidemia    Plantar fascial fibromatosis    Type II or unspecified type diabetes mellitus without mention of complication, not stated as uncontrolled    Past Surgical History:  Procedure Laterality Date   BREAST SURGERY  04/17/1995   reduction   CATARACT EXTRACTION Right 04/16/2013   CATARACT EXTRACTION Left 04/16/2014   EYE SURGERY     Left eye contracts removed   EYE SURGERY Right 09/2021  Family History  Problem Relation Age of Onset   Dementia Mother    Memory loss Sister    Dementia Sister    Social History   Socioeconomic History   Marital status: Married    Spouse name: Not on file   Number of children: Not on file   Years of education: Not on file   Highest education level: Not on file  Occupational History   Not on file  Tobacco Use   Smoking status: Never   Smokeless tobacco: Never  Vaping Use   Vaping Use: Never used  Substance and Sexual Activity   Alcohol use: Yes    Comment: Occasionally   Drug use: No   Sexual activity: Not Currently  Other Topics Concern   Not on file  Social History Narrative   Not on  file   Social Determinants of Health   Financial Resource Strain: Low Risk  (09/13/2017)   Overall Financial Resource Strain (CARDIA)    Difficulty of Paying Living Expenses: Not hard at all  Food Insecurity: No Food Insecurity (09/13/2017)   Hunger Vital Sign    Worried About Running Out of Food in the Last Year: Never true    Ran Out of Food in the Last Year: Never true  Transportation Needs: No Transportation Needs (09/13/2017)   PRAPARE - Administrator, Civil Service (Medical): No    Lack of Transportation (Non-Medical): No  Physical Activity: Sufficiently Active (09/13/2017)   Exercise Vital Sign    Days of Exercise per Week: 7 days    Minutes of Exercise per Session: 60 min  Stress: No Stress Concern Present (09/13/2017)   [employer] of Occupational Health - Occupational Stress Questionnaire    Feeling of Stress : Not at all  Social Connections: Socially Integrated (09/13/2017)   Social Connection and Isolation Panel [NHANES]    Frequency of Communication with Friends and Family: More than three times a week    Frequency of Social Gatherings with Friends and Family: More than three times a week    Attends Religious Services: More than 4 times per year    Active Member of [employer] or Organizations: Yes    Attends Engineer, structural: More than 4 times per year    Marital Status: Married    Tobacco Counseling Counseling given: Not Answered   Clinical Intake:  Pre-visit preparation completed: Yes  Pain : No/denies pain     BMI - recorded: 27.7 Nutritional Risks: None Diabetes: Yes  How often do you need to have someone help you when you read instructions, pamphlets, or other written materials from your doctor or pharmacy?: 1 - Never         Activities of Daily Living    10/12/2022    9:22 AM  In your present state of health, do you have any difficulty performing the following activities:  Hearing? 0  Vision? 0  Difficulty  concentrating or making decisions? 1  Walking or climbing stairs? 0  Dressing or bathing? 0  Doing errands, shopping? 0  Preparing Food and eating ? N  Using the Toilet? N  In the past six months, have you accidently leaked urine? N  Do you have problems with loss of bowel control? N  Managing your Medications? N  Managing your Finances? N  Housekeeping or managing your Housekeeping? N    Patient Care Team: [name], NP as PCP - General (Nurse Practitioner) Carman Ching, OD (Optometry) Blima Ledger,  OD (Optometry)  Indicate any recent Medical Services you may have received from other than [facility] providers in the past year (date may be approximate).     Assessment:   This is a routine wellness examination for [place].  Hearing/Vision screen Hearing Screening - Comments:: Patient is established with an audiologist.  Vision Screening - Comments:: Last eye exam less than 12 months ago with Dr.Miller   Dietary issues and exercise activities discussed:     Goals Addressed   None    Depression Screen    10/12/2022    9:03 AM 04/20/2022    2:00 PM 10/03/2021    9:04 AM 06/02/2021    9:33 AM 09/29/2020    8:41 AM 08/05/2020    8:42 AM 01/22/2020    9:39 AM  PHQ 2/9 Scores  PHQ - 2 Score 0 0 0 0 0 0 0    Fall Risk    10/12/2022    9:03 AM 04/20/2022    2:00 PM 10/06/2021    9:01 AM 10/03/2021    9:03 AM 06/02/2021    9:33 AM  Fall Risk   Falls in the past year? 0 0 0 0 0  Number falls in past yr: 0 0 0 0 0  Injury with Fall? 0 0 0 0 0  Risk for fall due to : No Fall Risks No Fall Risks No Fall Risks  No Fall Risks  Follow up Falls evaluation completed Falls evaluation completed Falls evaluation completed  Falls evaluation completed    MEDICARE RISK AT HOME:   TIMED UP AND GO:  Was the test performed?  No    Cognitive Function:    10/12/2022    9:10 AM 09/13/2017    8:48 AM 08/16/2016    9:57 AM  MMSE - Mini Mental State Exam  Orientation to time 5 4 5    Orientation to Place 5 4 5   Registration 3 3 3   Attention/ Calculation 5 5 5   Recall 2 2 2   Language- name 2 objects 2 2 2   Language- repeat 1 1 1   Language- follow 3 step command 3 3 3   Language- read & follow direction 1 1 1   Write a sentence 1 1 1   Copy design 0 1 1  Total score 28 27 29         10/03/2021    9:04 AM 09/29/2020    8:45 AM 09/24/2019    8:59 AM 09/19/2018    4:47 PM  6CIT Screen  What Year? 0 points 0 points 0 points 0 points  What month? 0 points 0 points 0 points 0 points  What time? 0 points 0 points 0 points 0 points  Count back from 20 0 points 0 points 0 points 0 points  Months in reverse 0 points 0 points 0 points 0 points  Repeat phrase 0 points 0 points 0 points 0 points  Total Score 0 points 0 points 0 points 0 points    Immunizations Immunization History  Administered Date(s) Administered   Fluad Quad(high Dose 65+) 01/27/2021   Influenza, High Dose Seasonal PF 01/29/2019   Influenza, Quadrivalent, Recombinant, Inj, Pf 02/13/2018   Influenza-Unspecified 02/14/2015   PFIZER(Purple Top)SARS-COV-2 Vaccination 05/29/2019, 06/23/2019   Pneumococcal Conjugate-13 10/22/2013   Pneumococcal Polysaccharide-23 08/16/2016, 03/20/2019   Td 04/24/2009   Tdap 10/07/2016   Zoster Recombinat (Shingrix) 10/19/2017, 01/17/2018    TDAP status: Up to date  Flu Vaccine status: Up to date  Pneumococcal vaccine status: Up to  date  Covid-19 vaccine status: Information provided on how to obtain vaccines.   Qualifies for Shingles Vaccine? Yes   Zostavax completed No   Shingrix Completed?: Yes  Screening Tests Health Maintenance  Topic Date Due   OPHTHALMOLOGY EXAM  08/22/2022   HEMOGLOBIN A1C  10/16/2022   INFLUENZA VACCINE  11/15/2022   FOOT EXAM  12/09/2022   Diabetic kidney evaluation - eGFR measurement  04/18/2023   Diabetic kidney evaluation - Urine ACR  04/21/2023   Medicare Annual Wellness (AWV)  10/12/2023   DTaP/Tdap/Td (3 - Td or Tdap)  10/08/2026   Pneumonia Vaccine 58+ Years old  Completed   DEXA SCAN  Completed   Hepatitis C Screening  Completed   Zoster Vaccines- Shingrix  Completed   HPV VACCINES  Aged Out   Colonoscopy  Discontinued   COVID-19 Vaccine  Discontinued    Health Maintenance  Health Maintenance Due  Topic Date Due   OPHTHALMOLOGY EXAM  08/22/2022    Colorectal cancer screening: No longer required.   Mammogram status: No longer required due to age.  Bone Density status: Completed 05/25/2021. Results reflect: Bone density results: OSTEOPENIA. Repeat every 2 years.  Lung Cancer Screening: (Low Dose CT Chest recommended if Age 20-80 years, 20 pack-year currently smoking OR have quit w/in 15years.) does not qualify.   Lung Cancer Screening Referral: na  Additional Screening:  Hepatitis C Screening: does qualify; Completed   Vision Screening: Recommended annual ophthalmology exams for early detection of glaucoma and other disorders of the eye. Is the patient up to date with their annual eye exam?  Yes  Who is the provider or what is the name of the office in which the patient attends annual eye exams? Dr Hyacinth Meeker If pt is not established with a provider, would they like to be referred to a provider to establish care? No .   Dental Screening: Recommended annual dental exams for proper oral hygiene  Diabetic Foot Exam: Diabetic Foot Exam: Completed 11/2021  Community Resource Referral / Chronic Care Management: CRR required this visit?  No   CCM required this visit?  No     Plan:     I have personally reviewed and noted the following in the patient's chart:   Medical and social history Use of alcohol, tobacco or illicit drugs  Current medications and supplements including opioid prescriptions. Patient is not currently taking opioid prescriptions. Functional ability and status Nutritional status Physical activity Advanced directives List of other physicians Hospitalizations, surgeries,  and ER visits in previous 12 months Vitals Screenings to include cognitive, depression, and falls Referrals and appointments  In addition, I have reviewed and discussed with patient certain preventive protocols, quality metrics, and best practice recommendations. A written personalized care plan for preventive services as well as general preventive health recommendations were provided to patient.     [name], NP   10/12/2022   Place of service: [facility]

## 2022-11-14 DIAGNOSIS — R430 Anosmia: Secondary | ICD-10-CM | POA: Diagnosis not present

## 2022-11-20 ENCOUNTER — Other Ambulatory Visit: Payer: PPO

## 2022-11-20 DIAGNOSIS — E118 Type 2 diabetes mellitus with unspecified complications: Secondary | ICD-10-CM | POA: Diagnosis not present

## 2022-11-20 DIAGNOSIS — I10 Essential (primary) hypertension: Secondary | ICD-10-CM

## 2022-11-20 DIAGNOSIS — E785 Hyperlipidemia, unspecified: Secondary | ICD-10-CM

## 2022-11-26 ENCOUNTER — Encounter: Payer: Self-pay | Admitting: Nurse Practitioner

## 2022-11-26 ENCOUNTER — Ambulatory Visit (INDEPENDENT_AMBULATORY_CARE_PROVIDER_SITE_OTHER): Payer: PPO | Admitting: Nurse Practitioner

## 2022-11-26 VITALS — BP 132/84 | HR 82 | Temp 97.0°F | Ht 61.5 in | Wt 149.2 lb

## 2022-11-26 DIAGNOSIS — E118 Type 2 diabetes mellitus with unspecified complications: Secondary | ICD-10-CM

## 2022-11-26 DIAGNOSIS — E785 Hyperlipidemia, unspecified: Secondary | ICD-10-CM | POA: Diagnosis not present

## 2022-11-26 DIAGNOSIS — M8580 Other specified disorders of bone density and structure, unspecified site: Secondary | ICD-10-CM | POA: Diagnosis not present

## 2022-11-26 DIAGNOSIS — I10 Essential (primary) hypertension: Secondary | ICD-10-CM

## 2022-11-26 MED ORDER — SIMVASTATIN 40 MG PO TABS: 40.0000 mg | ORAL_TABLET | Freq: Every day | ORAL | 1 refills | Status: DC

## 2022-11-26 NOTE — Progress Notes (Signed)
Careteam: Patient Care Team: [name], NP as PCP - General (Nurse Practitioner) Carman Ching, OD (Optometry) Blima Ledger, OD (Optometry)  PLACE OF SERVICE:  [facility]  Advanced Directive information Does Patient Have a Medical Advance Directive?: Yes, Type of Advance Directive: Living will, Does patient want to make changes to medical advance directive?: No - Patient declined  Allergies  Allergen Reactions   Codeine    Sulfa Antibiotics     Chief Complaint  Patient presents with   Medical Management of Chronic Issues    6 month follow-up. Discuss need for eye exam and flu vaccine (not available to document in Epic)      HPI: Patient is a 77 y.o. female for routine follow up   LDL has gone up. She has been eating more carbs and cheese.   She can not taste or smell now. Doing therapy through ENT now.   DM- eating more carbs, A1c remains at goal at 6.1 She walks a lot and working in yard.  Walks 2 hours every Sunday.   She sleeps well.   Review of Systems:  Review of Systems  Constitutional:  Negative for chills, fever and weight loss.  HENT:  Negative for tinnitus.   Respiratory:  Negative for cough, sputum production and shortness of breath.   Cardiovascular:  Negative for chest pain, palpitations and leg swelling.  Gastrointestinal:  Negative for abdominal pain, constipation, diarrhea and heartburn.  Genitourinary:  Negative for dysuria, frequency and urgency.  Musculoskeletal:  Negative for back pain, falls, joint pain and myalgias.  Skin: Negative.   Neurological:  Negative for dizziness and headaches.  Psychiatric/Behavioral:  Negative for depression and memory loss. The patient does not have insomnia.     Past Medical History:  Diagnosis Date   Acute bronchitis    Anxiety    Disorder of bone and cartilage, unspecified    Elevated blood pressure reading without diagnosis of hypertension    Myalgia and myositis, unspecified     Osteoporosis, unspecified    Other abnormal blood chemistry    Other and unspecified hyperlipidemia    Plantar fascial fibromatosis    Type II or unspecified type diabetes mellitus without mention of complication, not stated as uncontrolled    Past Surgical History:  Procedure Laterality Date   BREAST SURGERY  04/17/1995   reduction   CATARACT EXTRACTION Right 04/16/2013   CATARACT EXTRACTION Left 04/16/2014   EYE SURGERY     Left eye contracts removed   EYE SURGERY Right 09/2021   Social History:   reports that she has never smoked. She has never used smokeless tobacco. She reports current alcohol use. She reports that she does not use drugs.  Family History  Problem Relation Age of Onset   Dementia Mother    Memory loss Sister    Dementia Sister     Medications: Patient's Medications  New Prescriptions   No medications on file  Previous Medications   ACETAMINOPHEN (TYLENOL) 650 MG CR TABLET    Take 1,300 mg by mouth daily as needed for pain.   BLOOD GLUCOSE MONITORING SUPPL (ACCU-CHEK AVIVA) DEVICE    1 each by Other route daily. Check blood sugar once daily   CHOLECALCIFEROL (D3 5000 PO)    Take 2 capsules by mouth daily.   GLUCOSE BLOOD (ACCU-CHEK AVIVA PLUS) TEST STRIP    1 each by Other route daily.   LANCETS (ACCU-CHEK SOFT TOUCH) LANCETS    1 each by  Other route daily.   METFORMIN (GLUCOPHAGE) 500 MG TABLET    TAKE 1/2 TABLET(250 MG) BY MOUTH DAILY WITH BREAKFAST   POLYETHYL GLYCOL-PROPYL GLYCOL (SYSTANE) 0.4-0.3 % SOLN    Apply 1 drop to eye daily.   SIMVASTATIN (ZOCOR) 20 MG TABLET    TAKE 1 AND 1/2 TABLETS(30 MG) BY MOUTH DAILY AT 6 PM  Modified Medications   No medications on file  Discontinued Medications   CHOLECALCIFEROL (VITAMIN D) 125 MCG (5000 UT) CAPS    Take 2 capsules by mouth daily.    Physical Exam:  Vitals:   11/26/22 1014  BP: 132/84  Pulse: 82  Temp: (!) 97 F (36.1 C)  TempSrc: Temporal  SpO2: 96%  Weight: 149 lb 3.2 oz (67.7 kg)   Height: 5' 1.5" (1.562 m)   Body mass index is 27.73 kg/m. Wt Readings from Last 3 Encounters:  11/26/22 149 lb 3.2 oz (67.7 kg)  10/12/22 149 lb (67.6 kg)  04/19/22 150 lb (68 kg)    Physical Exam Constitutional:      General: She is not in acute distress.    Appearance: She is well-developed. She is not diaphoretic.  HENT:     Head: Normocephalic and atraumatic.     Mouth/Throat:     Pharynx: No oropharyngeal exudate.  Eyes:     Conjunctiva/sclera: Conjunctivae normal.     Pupils: Pupils are equal, round, and reactive to light.  Cardiovascular:     Rate and Rhythm: Normal rate and regular rhythm.     Heart sounds: Normal heart sounds.  Pulmonary:     Effort: Pulmonary effort is normal.     Breath sounds: Normal breath sounds.  Abdominal:     General: Bowel sounds are normal.     Palpations: Abdomen is soft.  Musculoskeletal:     Cervical back: Normal range of motion and neck supple.     Right lower leg: No edema.     Left lower leg: No edema.  Skin:    General: Skin is warm and dry.  Neurological:     Mental Status: She is alert.  Psychiatric:        Mood and Affect: Mood normal.    Labs reviewed: Basic Metabolic Panel: Recent Labs    04/17/22 0817 11/20/22 0811  NA 140 141  K 3.9 4.2  CL 104 105  CO2 25 26  GLUCOSE 112* 111*  BUN 14 19  CREATININE 0.80 0.83  CALCIUM 9.9 9.8   Liver Function Tests: Recent Labs    04/17/22 0817 11/20/22 0811  AST 22 24  ALT 23 24  BILITOT 0.6 0.5  PROT 7.8 7.6   No results for input(s): "LIPASE", "AMYLASE" in the last 8760 hours. No results for input(s): "AMMONIA" in the last 8760 hours. CBC: Recent Labs    04/17/22 0817 11/20/22 0811  WBC 7.1 4.8  NEUTROABS 2,691 2,218  HGB 14.3 14.0  HCT 42.5 42.1  MCV 86.2 88.4  PLT 278 271   Lipid Panel: Recent Labs    04/17/22 0817 11/20/22 0811  CHOL 156 170  HDL 44* 43*  LDLCALC 89 103*  TRIG 133 141  CHOLHDL 3.5 4.0   TSH: No results for input(s):  "TSH" in the last 8760 hours. A1C: Lab Results  Component Value Date   HGBA1C 6.1 (H) 11/20/2022     Assessment/Plan 1. Hyperlipidemia LDL goal <70 -continue dietary modifications  -will increase zocor to 40 mg daily  - simvastatin (ZOCOR) 40 MG  tablet; Take 1 tablet (40 mg total) by mouth at bedtime.  Dispense: 90 tablet; Refill: 1 - Lipid panel; Future - COMPLETE METABOLIC PANEL WITH GFR; Future  2. Controlled type 2 diabetes mellitus with complication, without long-term current use of insulin (HCC) -Encouraged dietary compliance, routine foot care/monitoring and to keep up with diabetic eye exams through ophthalmology  -tried acei but did not tolerate Continue metformin - COMPLETE METABOLIC PANEL WITH GFR; Future - CBC with Differential/Platelet; Future - Hemoglobin A1c; Future  3. Essential hypertension, benign -Blood pressure well controlled off medication  Continue current medications and dietary modifications follow metabolic panel  4. Osteopenia, unspecified location -Recommended to take calcium 600 mg twice daily with Vitamin D 2000 units daily and weight bearing activity 30 mins/5 days a week   Return in about 6 months (around 05/29/2023) for routine follow up, labs prior to visit . Jasmine James. Biagio Borg [facility] [phone]

## 2022-12-10 ENCOUNTER — Ambulatory Visit: Payer: PPO | Admitting: Podiatry

## 2022-12-11 ENCOUNTER — Ambulatory Visit (INDEPENDENT_AMBULATORY_CARE_PROVIDER_SITE_OTHER): Payer: PPO | Admitting: Podiatry

## 2022-12-11 ENCOUNTER — Encounter: Payer: Self-pay | Admitting: Podiatry

## 2022-12-11 VITALS — BP 153/92 | HR 122

## 2022-12-11 DIAGNOSIS — R030 Elevated blood-pressure reading, without diagnosis of hypertension: Secondary | ICD-10-CM | POA: Diagnosis not present

## 2022-12-11 DIAGNOSIS — E119 Type 2 diabetes mellitus without complications: Secondary | ICD-10-CM | POA: Diagnosis not present

## 2022-12-11 DIAGNOSIS — M2011 Hallux valgus (acquired), right foot: Secondary | ICD-10-CM | POA: Diagnosis not present

## 2022-12-11 DIAGNOSIS — M2012 Hallux valgus (acquired), left foot: Secondary | ICD-10-CM

## 2022-12-11 NOTE — Progress Notes (Signed)
ANNUAL DIABETIC FOOT EXAM  Subjective: Jasmine James presents today annual diabetic foot exam. Patient did not take her blood pressure medication this morning.  Patient confirms h/o diabetes.  Patient denies any h/o foot wounds.  Patient denies any numbness, tingling, burning, or pins/needle sensation in feet.  Risk factors: diabetes, HTN, hyperlipidemia.  [name], NP is patient's PCP.  Past Medical History:  Diagnosis Date   Acute bronchitis    Anxiety    Disorder of bone and cartilage, unspecified    Elevated blood pressure reading without diagnosis of hypertension    Myalgia and myositis, unspecified    Osteoporosis, unspecified    Other abnormal blood chemistry    Other and unspecified hyperlipidemia    Plantar fascial fibromatosis    Type II or unspecified type diabetes mellitus without mention of complication, not stated as uncontrolled    Patient Active Problem List   Diagnosis Date Noted   Normal hearing noted on examination 11/24/2021   Osteoporosis 02/27/2019   Plantar fasciitis of left foot 01/29/2018   Controlled type 2 diabetes mellitus with complication, without long-term current use of insulin (HCC) 07/21/2015   Hyperlipidemia LDL goal <70 07/21/2015   Essential hypertension, benign 07/21/2015   Past Surgical History:  Procedure Laterality Date   BREAST SURGERY  04/17/1995   reduction   CATARACT EXTRACTION Right 04/16/2013   CATARACT EXTRACTION Left 04/16/2014   EYE SURGERY     Left eye contracts removed   EYE SURGERY Right 09/2021   Current Outpatient Medications on File Prior to Visit  Medication Sig Dispense Refill   acetaminophen (TYLENOL) 650 MG CR tablet Take 1,300 mg by mouth daily as needed for pain.     Blood Glucose Monitoring Suppl (ACCU-CHEK AVIVA) device 1 each by Other route daily. Check blood sugar once daily 1 each 0   Cholecalciferol (D3 5000 PO) Take 2 capsules by mouth daily.     glucose blood (ACCU-CHEK AVIVA PLUS)  test strip 1 each by Other route daily. 100 each 5   Lancets (ACCU-CHEK SOFT TOUCH) lancets 1 each by Other route daily. 100 each 5   metFORMIN (GLUCOPHAGE) 500 MG tablet TAKE 1/2 TABLET(250 MG) BY MOUTH DAILY WITH BREAKFAST 45 tablet 1   Polyethyl Glycol-Propyl Glycol (SYSTANE) 0.4-0.3 % SOLN Apply 1 drop to eye daily.     simvastatin (ZOCOR) 40 MG tablet Take 1 tablet (40 mg total) by mouth at bedtime. 90 tablet 1   No current facility-administered medications on file prior to visit.    Allergies  Allergen Reactions   Codeine    Sulfa Antibiotics    Social History   Occupational History   Not on file  Tobacco Use   Smoking status: Never   Smokeless tobacco: Never  Vaping Use   Vaping status: Never Used  Substance and Sexual Activity   Alcohol use: Yes    Comment: Occasionally   Drug use: No   Sexual activity: Not Currently   Family History  Problem Relation Age of Onset   Dementia Mother    Memory loss Sister    Dementia Sister    Immunization History  Administered Date(s) Administered   Fluad Quad(high Dose 65+) 01/27/2021   Influenza, High Dose Seasonal PF 01/29/2019   Influenza, Quadrivalent, Recombinant, Inj, Pf 02/13/2018   Influenza-Unspecified 02/14/2015   PFIZER(Purple Top)SARS-COV-2 Vaccination 05/29/2019, 06/23/2019   Pneumococcal Conjugate-13 10/22/2013   Pneumococcal Polysaccharide-23 08/16/2016, 03/20/2019   Td 04/24/2009   Tdap 10/07/2016   Zoster Recombinant(Shingrix)  10/19/2017, 01/17/2018     Review of Systems: Negative except as noted in the HPI.   Objective: Vitals:   12/11/22 0847  BP: (!) 153/92  Pulse: (!) 122    Jasmine James is a pleasant 77 y.o. female in NAD. AAO X 3.  Vascular Examination: Capillary refill time immediate b/l. Vascular status intact b/l with palpable pedal pulses. Pedal hair present b/l. No pain with calf compression b/l. Skin temperature gradient WNL b/l. No cyanosis or clubbing b/l. No ischemia or gangrene  noted b/l.   Neurological Examination: Sensation grossly intact b/l with 10 gram monofilament. Vibratory sensation intact b/l.   Dermatological Examination: Pedal skin with normal turgor, texture and tone b/l.  No open wounds. No interdigital macerations.   Toenails recently debrided. No corns, calluses nor porokeratotic lesions noted.  Musculoskeletal Examination: Muscle strength 5/5 to all lower extremity muscle groups bilaterally. No pain, crepitus or joint limitation noted with ROM bilateral LE. HAV with bunion deformity noted b/l LE. Patient ambulates independent of any assistive aids.  Radiographs: None  Last A1c:      Latest Ref Rng & Units 11/20/2022    8:11 AM 04/17/2022    8:17 AM  Hemoglobin A1C  Hemoglobin-A1c <5.7 % of total Hgb 6.1  6.1    ADA Risk Categorization: Low Risk :  Patient has all of the following: Intact protective sensation No prior foot ulcer  No severe deformity Pedal pulses present  Assessment: 1. Hallux valgus, acquired, bilateral   2. Elevated blood pressure reading   3. Controlled type 2 diabetes mellitus without complication, without long-term current use of insulin (HCC)   4. Encounter for diabetic foot exam [facility])     Plan: -Patient was evaluated and treated. All patient's and/or POA's questions/concerns answered on today's visit. -Discussed blood pressure reading with patient. Patient is asymptomatic on today's visit. Patient advised to take blood pressure medication, repeat blood pressure at home and contact PCP/Cardiologist if it remains abnormal. Patient/Family/Caregiver/POA related understanding. -Diabetic foot examination performed today. -Continue diabetic foot care principles: inspect feet daily, monitor glucose as recommended by PCP and/or Endocrinologist, and follow prescribed diet per PCP, Endocrinologist and/or dietician. -Patient to continue soft, supportive shoe gear daily. -Patient/POA to call should there be question/concern in  the interim. Return in about 1 year (around 12/11/2023).  Freddie Breech, DPM

## 2023-01-07 ENCOUNTER — Other Ambulatory Visit: Payer: Self-pay | Admitting: Nurse Practitioner

## 2023-01-07 DIAGNOSIS — E119 Type 2 diabetes mellitus without complications: Secondary | ICD-10-CM

## 2023-04-12 ENCOUNTER — Other Ambulatory Visit: Payer: Self-pay | Admitting: Nurse Practitioner

## 2023-04-12 DIAGNOSIS — E785 Hyperlipidemia, unspecified: Secondary | ICD-10-CM

## 2023-04-12 MED ORDER — SIMVASTATIN 40 MG PO TABS
40.0000 mg | ORAL_TABLET | Freq: Every day | ORAL | 1 refills | Status: DC
Start: 2023-04-12 — End: 2023-12-10

## 2023-05-09 ENCOUNTER — Encounter: Payer: Self-pay | Admitting: Adult Health

## 2023-05-09 ENCOUNTER — Ambulatory Visit: Payer: PPO | Admitting: Adult Health

## 2023-05-09 VITALS — BP 132/78 | HR 98 | Temp 97.3°F | Resp 19 | Ht 61.5 in | Wt 154.4 lb

## 2023-05-09 DIAGNOSIS — E785 Hyperlipidemia, unspecified: Secondary | ICD-10-CM | POA: Diagnosis not present

## 2023-05-09 DIAGNOSIS — E1169 Type 2 diabetes mellitus with other specified complication: Secondary | ICD-10-CM | POA: Diagnosis not present

## 2023-05-09 DIAGNOSIS — Z23 Encounter for immunization: Secondary | ICD-10-CM

## 2023-05-09 DIAGNOSIS — J069 Acute upper respiratory infection, unspecified: Secondary | ICD-10-CM | POA: Diagnosis not present

## 2023-05-09 LAB — POC COVID19 BINAXNOW: SARS Coronavirus 2 Ag: NEGATIVE

## 2023-05-09 MED ORDER — VITAMIN C 1000 MG PO TABS: 1000.0000 mg | ORAL_TABLET | Freq: Every day | ORAL | 0 refills | Status: DC

## 2023-05-09 MED ORDER — VITAMIN C 1000 MG PO TABS
1000.0000 mg | ORAL_TABLET | Freq: Every day | ORAL | Status: AC
Start: 2023-05-09 — End: 2023-05-16

## 2023-05-09 MED ORDER — GUAIFENESIN ER 600 MG PO TB12
600.0000 mg | ORAL_TABLET | Freq: Two times a day (BID) | ORAL | Status: AC
Start: 2023-05-09 — End: 2023-05-16

## 2023-05-09 NOTE — Progress Notes (Signed)
[facility] clinic  Provider:  Kenard Gower DNP  Code Status:  Full Code  Goals of Care:     05/09/2023    1:17 PM  Advanced Directives  Does Patient Have a Medical Advance Directive? No  Would patient like information on creating a medical advance directive? No - Patient declined     Chief Complaint  Patient presents with   Acute Visit    Patient complains of sneezing, and coughing. Started Saturday 05/04/2023    Discussed the use of AI scribe software for clinical note transcription with the patient, who gave verbal consent to proceed.  HPI: Patient is a 78 y.o. female seen today for an acute visit for sneezing and coughing which started 05/04/23.   Miss Clingman, a diabetic patient, presents with symptoms of a cold that started after a beach trip. She reports exposure to a friend who was sneezing and coughing during the trip. Her symptoms began on the Friday following her return from the beach, manifesting as coughing, sneezing, and yellow phlegm. She denies experiencing fever, chills, shortness of breath, or wheezing. Her symptoms are managed with Mucinex, which she started taking after the onset of symptoms.  She also reports a loss of smell and taste since a previous COVID-19 infection two to three years ago. She has received two doses of the Pfizer COVID-19 vaccine.  Her diabetes is managed with Metformin, and she reports an average blood sugar level of 105 at home. She follows a diet consisting of oatmeal for breakfast, salad or soup for lunch, and a light dinner, and exercises by walking five miles a day. Her last A1C was 6.1, taken in January 2024.  She has a history of bronchitis, but the date of the last episode is unclear. She expresses concern about a potential recurrence due to her current coughing symptoms.      Past Medical History:  Diagnosis Date   Acute bronchitis    Anxiety    Disorder of bone and cartilage, unspecified    Elevated blood pressure reading  without diagnosis of hypertension    Myalgia and myositis, unspecified    Osteoporosis, unspecified    Other abnormal blood chemistry    Other and unspecified hyperlipidemia    Plantar fascial fibromatosis    Type II or unspecified type diabetes mellitus without mention of complication, not stated as uncontrolled     Past Surgical History:  Procedure Laterality Date   BREAST SURGERY  04/17/1995   reduction   CATARACT EXTRACTION Right 04/16/2013   CATARACT EXTRACTION Left 04/16/2014   EYE SURGERY     Left eye contracts removed   EYE SURGERY Right 09/2021    Allergies  Allergen Reactions   Codeine    Sulfa Antibiotics     Outpatient Encounter Medications as of 05/09/2023  Medication Sig   acetaminophen (TYLENOL) 650 MG CR tablet Take 1,300 mg by mouth daily as needed for pain.   Blood Glucose Monitoring Suppl (ACCU-CHEK AVIVA) device 1 each by Other route daily. Check blood sugar once daily   Cholecalciferol (D3 5000 PO) Take 2 capsules by mouth daily.   glucose blood (ACCU-CHEK AVIVA PLUS) test strip 1 each by Other route daily.   Lancets (ACCU-CHEK SOFT TOUCH) lancets 1 each by Other route daily.   metFORMIN (GLUCOPHAGE) 500 MG tablet TAKE 1/2 TABLET(250 MG) BY MOUTH DAILY WITH BREAKFAST   Polyethyl Glycol-Propyl Glycol (SYSTANE) 0.4-0.3 % SOLN Apply 1 drop to eye daily.   simvastatin (ZOCOR) 40 MG tablet  Take 1 tablet (40 mg total) by mouth at bedtime.   No facility-administered encounter medications on file as of 05/09/2023.    Review of Systems:  Review of Systems  Constitutional:  Negative for appetite change, chills, fatigue and fever.  HENT:  Negative for congestion, hearing loss, rhinorrhea and sore throat.   Eyes: Negative.   Respiratory:  Negative for cough, shortness of breath and wheezing.   Cardiovascular:  Negative for chest pain, palpitations and leg swelling.  Gastrointestinal:  Negative for abdominal pain, constipation, diarrhea, nausea and vomiting.   Genitourinary:  Negative for dysuria.  Musculoskeletal:  Negative for arthralgias, back pain and myalgias.  Skin:  Negative for color change, rash and wound.  Neurological:  Negative for dizziness, weakness and headaches.  Psychiatric/Behavioral:  Negative for behavioral problems. The patient is not nervous/anxious.     Health Maintenance  Topic Date Due   OPHTHALMOLOGY EXAM  08/22/2022   INFLUENZA VACCINE  11/15/2022   Diabetic kidney evaluation - Urine ACR  04/21/2023   HEMOGLOBIN A1C  05/23/2023   Medicare Annual Wellness (AWV)  10/12/2023   Diabetic kidney evaluation - eGFR measurement  11/20/2023   FOOT EXAM  12/11/2023   DTaP/Tdap/Td (3 - Td or Tdap) 10/08/2026   Pneumonia Vaccine 70+ Years old  Completed   DEXA SCAN  Completed   Hepatitis C Screening  Completed   Zoster Vaccines- Shingrix  Completed   HPV VACCINES  Aged Out   Colonoscopy  Discontinued   COVID-19 Vaccine  Discontinued    Physical Exam: Vitals:   05/09/23 1307  BP: 132/78  Pulse: 98  Resp: 19  Temp: (!) 97.3 F (36.3 C)  SpO2: 92%  Weight: 154 lb 6.4 oz (70 kg)  Height: 5' 1.5" (1.562 m)   Body mass index is 28.7 kg/m. Physical Exam Constitutional:      Appearance: Normal appearance.  HENT:     Head: Normocephalic and atraumatic.     Nose: Nose normal.     Mouth/Throat:     Mouth: Mucous membranes are moist.  Eyes:     Conjunctiva/sclera: Conjunctivae normal.  Cardiovascular:     Rate and Rhythm: Normal rate and regular rhythm.  Pulmonary:     Effort: Pulmonary effort is normal.     Breath sounds: Normal breath sounds.  Abdominal:     General: Bowel sounds are normal.     Palpations: Abdomen is soft.  Musculoskeletal:        General: Normal range of motion.     Cervical back: Normal range of motion.  Skin:    General: Skin is warm and dry.  Neurological:     General: No focal deficit present.     Mental Status: She is alert and oriented to person, place, and time.  Psychiatric:         Mood and Affect: Mood normal.        Behavior: Behavior normal.        Thought Content: Thought content normal.        Judgment: Judgment normal.     Labs reviewed: Basic Metabolic Panel: Recent Labs    11/20/22 0811  NA 141  K 4.2  CL 105  CO2 26  GLUCOSE 111*  BUN 19  CREATININE 0.83  CALCIUM 9.8   Liver Function Tests: Recent Labs    11/20/22 0811  AST 24  ALT 24  BILITOT 0.5  PROT 7.6   No results for input(s): "LIPASE", "AMYLASE" in the last 8760  hours. No results for input(s): "AMMONIA" in the last 8760 hours. CBC: Recent Labs    11/20/22 0811  WBC 4.8  NEUTROABS 2,218  HGB 14.0  HCT 42.1  MCV 88.4  PLT 271   Lipid Panel: Recent Labs    11/20/22 0811  CHOL 170  HDL 43*  LDLCALC 103*  TRIG 141  CHOLHDL 4.0   Lab Results  Component Value Date   HGBA1C 6.1 (H) 11/20/2022    Procedures since last visit: No results found.  Assessment/Plan  Assessment and Plan    Upper Respiratory Infection Symptoms of coughing and sneezing with yellow phlegm. No fever, shortness of breath, or wheezing. Likely viral in nature. -Continue Mucinex 600 mg PO twice daily for another week. -  start Vitamin C 1,000 mg PO daily X 7 days -Encouraged hydration, chest physiotherapy, and deep breathing exercises.  Diabetes Mellitus Well-controlled with Metformin 250mg  daily. Last A1C was 6.1 in January 2024. -Continue current management. -Repeat A1C at next appointment on May 28, 2023.  Hyperlipidemia Lab Results  Component Value Date   CHOL 170 11/20/2022   HDL 43 (L) 11/20/2022   LDLCALC 103 (H) 11/20/2022   TRIG 141 11/20/2022   CHOLHDL 4.0 11/20/2022    Managed with Zocor. -Continue current management.  Flu vaccine need -Administered influenza vaccine today.  General Health Maintenance -Checked for COVID-19 due to history of anosmia and ageusia since previous infection; result was negative. -Encouraged continuation of healthy diet and  daily exercise.        Labs/tests ordered:  POC COVID-19 test  Next appt:  05/28/2023

## 2023-05-28 ENCOUNTER — Other Ambulatory Visit: Payer: PPO

## 2023-05-28 DIAGNOSIS — E785 Hyperlipidemia, unspecified: Secondary | ICD-10-CM | POA: Diagnosis not present

## 2023-05-28 DIAGNOSIS — E118 Type 2 diabetes mellitus with unspecified complications: Secondary | ICD-10-CM | POA: Diagnosis not present

## 2023-05-29 LAB — LIPID PANEL
Cholesterol: 136 mg/dL (ref <200)
HDL: 40 mg/dL — ABNORMAL LOW (ref >=50)
LDL Cholesterol (Calc): 76 mg/dL
Non-HDL Cholesterol (Calc): 96 mg/dL (ref <130)
Total CHOL/HDL Ratio: 3.4 (calc) (ref <5.0)
Triglycerides: 114 mg/dL (ref <150)

## 2023-05-29 LAB — CBC WITH DIFFERENTIAL/PLATELET
Absolute Lymphocytes: 2050 {cells}/uL (ref 850–3900)
Absolute Monocytes: 523 {cells}/uL (ref 200–950)
Basophils Absolute: 48 {cells}/uL (ref 0–200)
Basophils Relative: 1 %
Eosinophils Absolute: 72 {cells}/uL (ref 15–500)
Eosinophils Relative: 1.5 %
HCT: 42.3 % (ref 35.0–45.0)
Hemoglobin: 13.6 g/dL (ref 11.7–15.5)
MCH: 28.3 pg (ref 27.0–33.0)
MCHC: 32.2 g/dL (ref 32.0–36.0)
MCV: 88.1 fL (ref 80.0–100.0)
MPV: 10 fL (ref 7.5–12.5)
Monocytes Relative: 10.9 %
Neutro Abs: 2107 {cells}/uL (ref 1500–7800)
Neutrophils Relative %: 43.9 %
Platelets: 277 Thousand/uL (ref 140–400)
RBC: 4.8 Million/uL (ref 3.80–5.10)
RDW: 13 % (ref 11.0–15.0)
Total Lymphocyte: 42.7 %
WBC: 4.8 Thousand/uL (ref 3.8–10.8)

## 2023-05-29 LAB — COMPLETE METABOLIC PANEL WITHOUT GFR
AG Ratio: 1.6 (calc) (ref 1.0–2.5)
ALT: 27 U/L (ref 6–29)
AST: 27 U/L (ref 10–35)
Albumin: 4.6 g/dL (ref 3.6–5.1)
Alkaline phosphatase (APISO): 75 U/L (ref 37–153)
BUN: 14 mg/dL (ref 7–25)
CO2: 27 mmol/L (ref 20–32)
Calcium: 9.7 mg/dL (ref 8.6–10.4)
Chloride: 103 mmol/L (ref 98–110)
Creat: 0.77 mg/dL (ref 0.60–1.00)
Globulin: 2.9 g/dL (ref 1.9–3.7)
Glucose, Bld: 96 mg/dL (ref 65–99)
Potassium: 4 mmol/L (ref 3.5–5.3)
Sodium: 139 mmol/L (ref 135–146)
Total Bilirubin: 0.7 mg/dL (ref 0.2–1.2)
Total Protein: 7.5 g/dL (ref 6.1–8.1)
eGFR: 79 mL/min/1.73m2

## 2023-05-29 LAB — HEMOGLOBIN A1C
Hgb A1c MFr Bld: 6.2 %{Hb} — ABNORMAL HIGH
Mean Plasma Glucose: 131 mg/dL
eAG (mmol/L): 7.3 mmol/L

## 2023-05-31 ENCOUNTER — Ambulatory Visit (INDEPENDENT_AMBULATORY_CARE_PROVIDER_SITE_OTHER): Payer: PPO | Admitting: Nurse Practitioner

## 2023-05-31 ENCOUNTER — Encounter: Payer: Self-pay | Admitting: Nurse Practitioner

## 2023-05-31 VITALS — BP 128/82 | HR 94 | Temp 96.1°F | Ht 61.5 in | Wt 154.2 lb

## 2023-05-31 DIAGNOSIS — I10 Essential (primary) hypertension: Secondary | ICD-10-CM

## 2023-05-31 DIAGNOSIS — E119 Type 2 diabetes mellitus without complications: Secondary | ICD-10-CM | POA: Diagnosis not present

## 2023-05-31 DIAGNOSIS — M8580 Other specified disorders of bone density and structure, unspecified site: Secondary | ICD-10-CM

## 2023-05-31 DIAGNOSIS — E118 Type 2 diabetes mellitus with unspecified complications: Secondary | ICD-10-CM | POA: Diagnosis not present

## 2023-05-31 DIAGNOSIS — E2839 Other primary ovarian failure: Secondary | ICD-10-CM

## 2023-05-31 DIAGNOSIS — E785 Hyperlipidemia, unspecified: Secondary | ICD-10-CM

## 2023-05-31 NOTE — Progress Notes (Signed)
 Careteam: Patient Care Team: [name], NP as PCP - General (Nurse Practitioner) Carman Ching, OD (Optometry) Blima Ledger, OD (Optometry)   PLACE OF SERVICE: [facility]  Advanced Directive information     Allergies  Allergen Reactions   Codeine    Sulfa Antibiotics      Chief Complaint  Patient presents with   Medical Management of Chronic Issues    Medical Management of Chronic Issues. 6 Month follow up with labs. Eye Exam done at [facility] 01/2023. Records requested for eye exam. Needs Diabetic Kidney Eval.     HPI: Patient is a 78 y.o. female presents for a 2-month follow-up. States she is doing great. Reports her family is doing good. No concerns/complaints.  Tries to eat better, is walking 1.5 miles every day and 5 miles on Sunday. Sleeping well.  Denies falls.    Review of Systems:  Review of Systems  Constitutional:  Negative for chills, fever and weight loss.  Respiratory:  Negative for cough and shortness of breath.   Cardiovascular:  Negative for chest pain and palpitations.  Gastrointestinal:  Negative for abdominal pain, constipation, diarrhea, nausea and vomiting.  Genitourinary:  Negative for dysuria, frequency and urgency.  Musculoskeletal:  Negative for joint pain and myalgias.  Neurological:  Negative for dizziness, weakness and headaches.  Psychiatric/Behavioral:  Negative for depression and suicidal ideas. The patient does not have insomnia.     Past Medical History:  Diagnosis Date   Acute bronchitis    Anxiety    Disorder of bone and cartilage, unspecified    Elevated blood pressure reading without diagnosis of hypertension    Myalgia and myositis, unspecified    Osteoporosis, unspecified    Other abnormal blood chemistry    Other and unspecified hyperlipidemia    Plantar fascial fibromatosis    Type II or unspecified type diabetes mellitus without mention of complication, not stated as uncontrolled     Past Surgical  History:  Procedure Laterality Date   BREAST SURGERY  04/17/1995   reduction   CATARACT EXTRACTION Right 04/16/2013   CATARACT EXTRACTION Left 04/16/2014   EYE SURGERY     Left eye contracts removed   EYE SURGERY Right 09/2021     Social History:   reports that she has never smoked. She has never used smokeless tobacco. She reports current alcohol use. She reports that she does not use drugs.  Family History  Problem Relation Age of Onset   Dementia Mother    Memory loss Sister    Dementia Sister      Medications:  Patient's Medications  New Prescriptions   No medications on file  Previous Medications   ACETAMINOPHEN (TYLENOL) 650 MG CR TABLET    Take 1,300 mg by mouth daily as needed for pain.   BLOOD GLUCOSE MONITORING SUPPL (ACCU-CHEK AVIVA) DEVICE    1 each by Other route daily. Check blood sugar once daily   CHOLECALCIFEROL (D3 5000 PO)    Take 2 capsules by mouth daily.   GLUCOSE BLOOD (ACCU-CHEK AVIVA PLUS) TEST STRIP    1 each by Other route daily.   LANCETS (ACCU-CHEK SOFT TOUCH) LANCETS    1 each by Other route daily.   METFORMIN (GLUCOPHAGE) 500 MG TABLET    TAKE 1/2 TABLET(250 MG) BY MOUTH DAILY WITH BREAKFAST   POLYETHYL GLYCOL-PROPYL GLYCOL (SYSTANE) 0.4-0.3 % SOLN    Apply 1 drop to eye daily.   SIMVASTATIN (ZOCOR) 40 MG TABLET    Take  1 tablet (40 mg total) by mouth at bedtime.  Modified Medications   No medications on file  Discontinued Medications   No medications on file     Physical Exam:  Vitals:   05/31/23 0821  BP: 128/82  Pulse: 94  Temp: (!) 96.1 F (35.6 C)  SpO2: 100%  Weight: 154 lb 3.2 oz (69.9 kg)  Height: 5' 1.5" (1.562 m)   Body mass index is 28.66 kg/m.  Wt Readings from Last 3 Encounters:  05/31/23 154 lb 3.2 oz (69.9 kg)  05/09/23 154 lb 6.4 oz (70 kg)  11/26/22 149 lb 3.2 oz (67.7 kg)     Physical Exam Constitutional:      Appearance: Normal appearance.  Cardiovascular:     Rate and Rhythm: Normal rate and regular rhythm.      Pulses: Normal pulses.     Heart sounds: Normal heart sounds.  Pulmonary:     Effort: Pulmonary effort is normal.     Breath sounds: Normal breath sounds.  Abdominal:     General: Bowel sounds are normal.     Palpations: Abdomen is soft.  Musculoskeletal:        General: Normal range of motion.  Skin:    General: Skin is warm and dry.  Neurological:     General: No focal deficit present.     Mental Status: She is alert and oriented to person, place, and time.  Psychiatric:        Mood and Affect: Mood normal.        Behavior: Behavior normal.     Labs reviewed: Basic Metabolic Panel:  Recent Labs    11/20/22 0811 05/28/23 0805  NA 141 139  K 4.2 4.0  CL 105 103  CO2 26 27  GLUCOSE 111* 96  BUN 19 14  CREATININE 0.83 0.77  CALCIUM 9.8 9.7   Liver Function Tests:  Recent Labs    11/20/22 0811 05/28/23 0805  AST 24 27  ALT 24 27  BILITOT 0.5 0.7  PROT 7.6 7.5   No results for input(s): "LIPASE", "AMYLASE" in the last 8760 hours. No results for input(s): "AMMONIA" in the last 8760 hours. CBC:  Recent Labs    11/20/22 0811 05/28/23 0805  WBC 4.8 4.8  NEUTROABS 2,218 2,107  HGB 14.0 13.6  HCT 42.1 42.3  MCV 88.4 88.1  PLT 271 277   Lipid Panel:  Recent Labs    11/20/22 0811 05/28/23 0805  CHOL 170 136  HDL 43* 40*  LDLCALC 103* 76  TRIG 141 114  CHOLHDL 4.0 3.4   TSH: No results for input(s): "TSH" in the last 8760 hours. A1C:  Lab Results  Component Value Date   HGBA1C 6.2 (H) 05/28/2023     Assessment/Plan   1. Essential hypertension, benign (Primary) -BP controlled, at goal, <140/90; not on medication -Encouraged dietary modifications and physical activity as tolerated   2. Controlled type 2 diabetes mellitus without complication, without long-term current use of insulin (HCC) -Continue metformin -A1c 6.2, would like to continue improving her diet -Will have eye exam soon, encouraged routine foot care/monitoring -Encouraged dietary  modifications and physical activity as tolerated  - Urine Albumin/Creatinine with ratio (send out) [LAB689]  COMPLETE METABOLIC PANEL WITH GFR; Future - CBC with Differential/Platelet; Future - Hemoglobin A1c; Future  3. Hyperlipidemia LDL goal <70 -Continue simvastatin, LDL close to goal, decreased from 103 to 76 -Encouraged dietary modifications and physical activity as tolerated  - COMPLETE METABOLIC PANEL WITH  GFR; Future  4. Osteopenia, unspecified location -Continue Vit. D supplementation, encouraged weight-bearing exercise   5. Estrogen deficiency - DG Bone Density; Future   Return in about 6 months (around 11/28/2023) for routine follow up, labs prior to visit.:  Rollen Sox, Haroldine Laws MSN-FNP Student -I personally was present during the history, physical exam and medical decision-making activities of this service and have verified that the service and findings are accurately documented in the student's note  Eithen Castiglia K. Biagio Borg [facility] [phone]

## 2023-06-01 LAB — MICROALBUMIN / CREATININE URINE RATIO
Creatinine, Urine: 88 mg/dL (ref 20–275)
Microalb Creat Ratio: 10 mg/g{creat} (ref <30)
Microalb, Ur: 0.9 mg/dL

## 2023-06-18 ENCOUNTER — Other Ambulatory Visit: Payer: Self-pay | Admitting: Nurse Practitioner

## 2023-06-18 DIAGNOSIS — Z6828 Body mass index (BMI) 28.0-28.9, adult: Secondary | ICD-10-CM | POA: Diagnosis not present

## 2023-06-18 DIAGNOSIS — Z01419 Encounter for gynecological examination (general) (routine) without abnormal findings: Secondary | ICD-10-CM | POA: Diagnosis not present

## 2023-06-18 DIAGNOSIS — N958 Other specified menopausal and perimenopausal disorders: Secondary | ICD-10-CM | POA: Diagnosis not present

## 2023-06-18 DIAGNOSIS — M816 Localized osteoporosis [Lequesne]: Secondary | ICD-10-CM | POA: Diagnosis not present

## 2023-06-18 DIAGNOSIS — E119 Type 2 diabetes mellitus without complications: Secondary | ICD-10-CM

## 2023-06-18 LAB — HM DEXA SCAN

## 2023-07-04 DIAGNOSIS — Z1231 Encounter for screening mammogram for malignant neoplasm of breast: Secondary | ICD-10-CM | POA: Diagnosis not present

## 2023-07-09 ENCOUNTER — Encounter: Payer: Self-pay | Admitting: Nurse Practitioner

## 2023-07-09 DIAGNOSIS — M8580 Other specified disorders of bone density and structure, unspecified site: Secondary | ICD-10-CM | POA: Diagnosis not present

## 2023-08-08 DIAGNOSIS — M810 Age-related osteoporosis without current pathological fracture: Secondary | ICD-10-CM | POA: Diagnosis not present

## 2023-08-09 LAB — VITAMIN D 25 HYDROXY (VIT D DEFICIENCY, FRACTURES): Vit D, 25-Hydroxy: 42.9

## 2023-08-22 ENCOUNTER — Ambulatory Visit: Payer: Self-pay

## 2023-08-22 ENCOUNTER — Other Ambulatory Visit: Payer: Self-pay

## 2023-08-22 ENCOUNTER — Emergency Department (HOSPITAL_COMMUNITY)
Admission: EM | Admit: 2023-08-22 | Discharge: 2023-08-22 | Disposition: A | Discharge status: Home / Self Care | Attending: Emergency Medicine | Admitting: Emergency Medicine

## 2023-08-22 ENCOUNTER — Emergency Department (HOSPITAL_COMMUNITY)

## 2023-08-22 DIAGNOSIS — G459 Transient cerebral ischemic attack, unspecified: Secondary | ICD-10-CM | POA: Diagnosis not present

## 2023-08-22 DIAGNOSIS — Z7984 Long term (current) use of oral hypoglycemic drugs: Secondary | ICD-10-CM | POA: Diagnosis not present

## 2023-08-22 DIAGNOSIS — R42 Dizziness and giddiness: Secondary | ICD-10-CM | POA: Diagnosis not present

## 2023-08-22 DIAGNOSIS — E119 Type 2 diabetes mellitus without complications: Secondary | ICD-10-CM | POA: Insufficient documentation

## 2023-08-22 DIAGNOSIS — I6782 Cerebral ischemia: Secondary | ICD-10-CM | POA: Diagnosis not present

## 2023-08-22 DIAGNOSIS — I639 Cerebral infarction, unspecified: Secondary | ICD-10-CM | POA: Diagnosis not present

## 2023-08-22 LAB — COMPREHENSIVE METABOLIC PANEL WITH GFR
ALT: 30 U/L (ref 0–44)
AST: 35 U/L (ref 15–41)
Albumin: 4.2 g/dL (ref 3.5–5.0)
Alkaline Phosphatase: 68 U/L (ref 38–126)
Anion gap: 11 (ref 5–15)
BUN: 13 mg/dL (ref 8–23)
CO2: 23 mmol/L (ref 22–32)
Calcium: 9.4 mg/dL (ref 8.9–10.3)
Chloride: 107 mmol/L (ref 98–111)
Creatinine, Ser: 0.75 mg/dL (ref 0.44–1.00)
GFR, Estimated: >60 mL/min (ref >60)
Glucose, Bld: 118 mg/dL — ABNORMAL HIGH (ref 70–99)
Potassium: 3.9 mmol/L (ref 3.5–5.1)
Sodium: 141 mmol/L (ref 135–145)
Total Bilirubin: 0.6 mg/dL (ref 0.0–1.2)
Total Protein: 7.3 g/dL (ref 6.5–8.1)

## 2023-08-22 LAB — CBC
HCT: 41.7 % (ref 36.0–46.0)
Hemoglobin: 13.4 g/dL (ref 12.0–15.0)
MCH: 29 pg (ref 26.0–34.0)
MCHC: 32.1 g/dL (ref 30.0–36.0)
MCV: 90.3 fL (ref 80.0–100.0)
Platelets: 265 10*3/uL (ref 150–400)
RBC: 4.62 MIL/uL (ref 3.87–5.11)
RDW: 13.2 % (ref 11.5–15.5)
WBC: 5.5 10*3/uL (ref 4.0–10.5)
nRBC: 0 % (ref 0.0–0.2)

## 2023-08-22 LAB — URINALYSIS, ROUTINE W REFLEX MICROSCOPIC
Bacteria, UA: NONE SEEN
Bilirubin Urine: NEGATIVE
Glucose, UA: NEGATIVE mg/dL
Hgb urine dipstick: NEGATIVE
Ketones, ur: NEGATIVE mg/dL
Leukocytes,Ua: NEGATIVE
Nitrite: NEGATIVE
Protein, ur: NEGATIVE mg/dL
Specific Gravity, Urine: 1.006 (ref 1.005–1.030)
pH: 7 (ref 5.0–8.0)

## 2023-08-22 LAB — CBG MONITORING, ED
Glucose-Capillary: 116 mg/dL — ABNORMAL HIGH (ref 70–99)
Glucose-Capillary: 86 mg/dL (ref 70–99)

## 2023-08-22 LAB — TROPONIN I (HIGH SENSITIVITY)
Troponin I (High Sensitivity): 4 ng/L (ref <18)
Troponin I (High Sensitivity): 5 ng/L (ref <18)

## 2023-08-22 MED ORDER — MECLIZINE HCL 25 MG PO TABS
12.5000 mg | ORAL_TABLET | Freq: Once | ORAL | Status: AC
Start: 2023-08-22 — End: 2023-08-22
  Administered 2023-08-22: 12.5 mg via ORAL
  Filled 2023-08-22: qty 1

## 2023-08-22 MED ORDER — SODIUM CHLORIDE 0.9 % IV BOLUS
500.0000 mL | Freq: Once | INTRAVENOUS | Status: AC
  Administered 2023-08-22: 500 mL via INTRAVENOUS

## 2023-08-22 MED ORDER — MECLIZINE HCL 12.5 MG PO TABS: 12.5000 mg | ORAL_TABLET | Freq: Three times a day (TID) | ORAL | 0 refills | Status: AC | PRN
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
## 2023-08-22 NOTE — Telephone Encounter (Signed)
 See Triage Notes from Triage Nurse FYI.   Message sent to Verma Gobble, NP

## 2023-08-22 NOTE — ED Notes (Signed)
 Patient transported to MRI
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
## 2023-08-22 NOTE — ED Provider Notes (Signed)
 [place] EMERGENCY DEPARTMENT AT [facility] Provider Note   CSN: [id number] Arrival date & time: 08/22/23  1339     History  Chief Complaint  Patient presents with   Dizziness    Jasmine James is a 78 y.o. female.  78 year old female presents today for concern of dizziness.  She was driving around noon when suddenly she became dizzy and she had to pull over.  She called her husband who brought her into the emergency department.  Patient states that this episode lasted 30-45 minutes.  Denies similar symptoms in the past.  She states that she drinks 2 bottles of water per day as well as 2 cups of coffee.  Denies any headache, chest pain shortness of breath, lightheadedness, nausea, or diaphoresis at the time of the episode.  She describes the sensation as room spinning as opposed to feeling lightheaded.  The history is provided by the patient. No language interpreter was used.       Home Medications Prior to Admission medications   Medication Sig Start Date End Date Taking? Authorizing Provider  acetaminophen (TYLENOL) 650 MG CR tablet Take 1,300 mg by mouth daily as needed for pain.    [provider]  Blood Glucose Monitoring Suppl (ACCU-CHEK AVIVA) device 1 each by Other route daily. Check blood sugar once daily 03/20/19   Ngetich, Dinah C, NP  Cholecalciferol (D3 5000 PO) Take 2 capsules by mouth daily.    [provider]  glucose blood (ACCU-CHEK AVIVA PLUS) test strip 1 each by Other route daily. 03/20/19   Ngetich, Dinah C, NP  Lancets (ACCU-CHEK SOFT TOUCH) lancets 1 each by Other route daily. 03/20/19   Ngetich, Dinah C, NP  metFORMIN  (GLUCOPHAGE ) 500 MG tablet TAKE 1/2 TABLET(250 MG) BY MOUTH DAILY WITH BREAKFAST 06/18/23   Eubanks, Jessica K, NP  Polyethyl Glycol-Propyl Glycol (SYSTANE) 0.4-0.3 % SOLN Apply 1 drop to eye daily.    [provider]  simvastatin  (ZOCOR ) 40 MG tablet Take 1 tablet (40 mg total) by mouth at bedtime. 04/12/23    Eubanks, Jessica K, NP      Allergies    Codeine and Sulfa antibiotics    Review of Systems   Review of Systems  Constitutional:  Negative for chills, diaphoresis and fever.  Respiratory:  Negative for shortness of breath.   Cardiovascular:  Negative for chest pain.  Gastrointestinal:  Negative for abdominal pain and nausea.  Neurological:  Positive for dizziness. Negative for syncope.  All other systems reviewed and are negative.   Physical Exam Updated Vital Signs BP (!) 166/76   Pulse 72   Temp 97.6 F (36.4 C)   Resp 17   SpO2 100%  Physical Exam Vitals and nursing note reviewed.  Constitutional:      General: She is not in acute distress.    Appearance: Normal appearance. She is not ill-appearing.  HENT:     Head: Normocephalic and atraumatic.     Nose: Nose normal.     Mouth/Throat:     Mouth: Mucous membranes are moist.     Pharynx: No oropharyngeal exudate or posterior oropharyngeal erythema.  Eyes:     Conjunctiva/sclera: Conjunctivae normal.  Cardiovascular:     Rate and Rhythm: Normal rate and regular rhythm.  Pulmonary:     Effort: Pulmonary effort is normal. No respiratory distress.  Musculoskeletal:        General: No deformity. Normal range of motion.     Cervical back: Normal range  of motion.  Skin:    Findings: No rash.  Neurological:     General: No focal deficit present.     Mental Status: She is alert and oriented to person, place, and time. Mental status is at baseline.     Cranial Nerves: No cranial nerve deficit.     Motor: No weakness.     ED Results / Procedures / Treatments   Labs (all labs ordered are listed, but only abnormal results are displayed) Labs Reviewed  COMPREHENSIVE METABOLIC PANEL WITH GFR - Abnormal; Notable for the following components:      Result Value   Glucose, Bld 118 (*)    All other components within normal limits  URINALYSIS, ROUTINE W REFLEX MICROSCOPIC - Abnormal; Notable for the following components:    Color, Urine STRAW (*)    All other components within normal limits  CBG MONITORING, ED - Abnormal; Notable for the following components:   Glucose-Capillary 116 (*)    All other components within normal limits  CBC  CBG MONITORING, ED  TROPONIN I (HIGH SENSITIVITY)  TROPONIN I (HIGH SENSITIVITY)    EKG EKG Interpretation Date/Time:  Thursday Aug 22 2023 15:28:01 EDT Ventricular Rate:  75 PR Interval:  139 QRS Duration:  93 QT Interval:  405 QTC Calculation: 453 R Axis:   -34  Text Interpretation: Sinus rhythm Abnormal R-wave progression, early transition Left ventricular hypertrophy No old tracing to compare Confirmed by Sueellen Emery [phone]) on 08/22/2023 3:33:00 PM  Radiology No results found.  Procedures Procedures    Medications Ordered in ED Medications  sodium chloride  0.9 % bolus 500 mL (0 mLs Intravenous Stopped 08/22/23 1705)  meclizine  (ANTIVERT ) tablet 12.5 mg (12.5 mg Oral Given 08/22/23 1557)    ED Course/ Medical Decision Making/ A&P                                 Medical Decision Making Amount and/or Complexity of Data Reviewed Labs: ordered. Radiology: ordered.   Medical Decision Making / ED Course   This patient presents to the ED for concern of dizziness, this involves an extensive number of treatment options, and is a complaint that carries with it a high risk of complications and morbidity.  The differential diagnosis includes orthostasis, vertigo, CVA, TIA  MDM: 78 year old female presents for concern of dizziness.  Describes this as room spinning.  Orthostatic vital signs negative on my exam during the interview.  Will give fluid bolus, as well as Antivert .  Seen by attending as well.  CBC unremarkable, CMP with glucose 118 otherwise without acute concern.  UA without evidence of UTI.  Troponin negative.  MRI without CVA or other acute concerning cause of dizziness.  EKG without acute ischemic change.  Patient remained  asymptomatic.  Likely peripheral vertigo.  Discussed increasing hydration, decreasing caffeine intake, and Antivert  prescribed.  Return precautions discussed.  Discharged in stable condition.  Patient and husband voiced understanding and are in agreement with plan.   Additional history obtained: -Additional history obtained from husband who is at bedside -External records from outside source obtained and reviewed including: Chart review including previous notes, labs, imaging, consultation notes   Lab Tests: -I ordered, reviewed, and interpreted labs.   The pertinent results include:   Labs Reviewed  COMPREHENSIVE METABOLIC PANEL WITH GFR - Abnormal; Notable for the following components:      Result Value   Glucose, Bld 118 (*)  All other components within normal limits  URINALYSIS, ROUTINE W REFLEX MICROSCOPIC - Abnormal; Notable for the following components:   Color, Urine STRAW (*)    All other components within normal limits  CBG MONITORING, ED - Abnormal; Notable for the following components:   Glucose-Capillary 116 (*)    All other components within normal limits  CBC  CBG MONITORING, ED  TROPONIN I (HIGH SENSITIVITY)  TROPONIN I (HIGH SENSITIVITY)      EKG  EKG Interpretation Date/Time:  Thursday Aug 22 2023 15:28:01 EDT Ventricular Rate:  75 PR Interval:  139 QRS Duration:  93 QT Interval:  405 QTC Calculation: 453 R Axis:   -34  Text Interpretation: Sinus rhythm Abnormal R-wave progression, early transition Left ventricular hypertrophy No old tracing to compare Confirmed by Sueellen Emery [phone]) on 08/22/2023 3:33:00 PM         Imaging Studies ordered: I ordered imaging studies including MRI brain without contrast I independently visualized and interpreted imaging. I agree with the radiologist interpretation   Medicines ordered and prescription drug management: Meds ordered this encounter  Medications   sodium chloride  0.9 % bolus 500 mL    meclizine  (ANTIVERT ) tablet 12.5 mg    -I have reviewed the patients home medicines and have made adjustments as needed   Social Determinants of Health:  Factors impacting patients care include: Good family support   Reevaluation: After the interventions noted above, I reevaluated the patient and found that they have :resolved  Co morbidities that complicate the patient evaluation  Past Medical History:  Diagnosis Date   Acute bronchitis    Anxiety    Disorder of bone and cartilage, unspecified    Elevated blood pressure reading without diagnosis of hypertension    Myalgia and myositis, unspecified    Osteoporosis, unspecified    Other abnormal blood chemistry    Other and unspecified hyperlipidemia    Plantar fascial fibromatosis    Type II or unspecified type diabetes mellitus without mention of complication, not stated as uncontrolled       Dispostion: Discharged in stable condition.  Return precaution discussed.  Patient voices understanding and is in agreement with plan.   Final Clinical Impression(s) / ED Diagnoses Final diagnoses:  Dizziness    Rx / DC Orders ED Discharge Orders          Ordered    meclizine  (ANTIVERT ) 12.5 MG tablet  3 times daily PRN        08/22/23 2034              Lucina Sabal, PA-C 08/22/23 2037    Sueellen Emery, MD 08/28/23 [phone]

## 2023-08-22 NOTE — ED Notes (Signed)
 Patient ambulated with NT with no dizziness or distress reported.

## 2023-08-22 NOTE — Telephone Encounter (Signed)
 Thank you for the msg, glad she is going to ED to be evaluated

## 2023-08-22 NOTE — Discharge Instructions (Signed)
 Your workup today was reassuring.  No concerning cause of your dizziness.  MRI did not show evidence of stroke or other concerning findings.  Blood work was reassuring.  We did give you some IV fluids as well.  Increase your fluid consumption and decrease your caffeine intake.  Follow-up with the primary care doctor.

## 2023-08-27 DIAGNOSIS — L814 Other melanin hyperpigmentation: Secondary | ICD-10-CM | POA: Diagnosis not present

## 2023-08-27 DIAGNOSIS — D0462 Carcinoma in situ of skin of left upper limb, including shoulder: Secondary | ICD-10-CM | POA: Diagnosis not present

## 2023-08-27 DIAGNOSIS — L218 Other seborrheic dermatitis: Secondary | ICD-10-CM | POA: Diagnosis not present

## 2023-08-27 DIAGNOSIS — L570 Actinic keratosis: Secondary | ICD-10-CM | POA: Diagnosis not present

## 2023-08-27 DIAGNOSIS — L821 Other seborrheic keratosis: Secondary | ICD-10-CM | POA: Diagnosis not present

## 2023-10-09 DIAGNOSIS — E119 Type 2 diabetes mellitus without complications: Secondary | ICD-10-CM | POA: Diagnosis not present

## 2023-10-09 DIAGNOSIS — H43393 Other vitreous opacities, bilateral: Secondary | ICD-10-CM | POA: Diagnosis not present

## 2023-10-09 LAB — HM DIABETES EYE EXAM

## 2023-10-14 ENCOUNTER — Ambulatory Visit (INDEPENDENT_AMBULATORY_CARE_PROVIDER_SITE_OTHER): Payer: PPO | Admitting: Nurse Practitioner

## 2023-10-14 ENCOUNTER — Encounter: Payer: Self-pay | Admitting: Nurse Practitioner

## 2023-10-14 VITALS — BP 132/78 | HR 100 | Temp 96.6°F | Resp 18 | Ht 61.5 in | Wt 147.6 lb

## 2023-10-14 DIAGNOSIS — Z0000 Encounter for general adult medical examination without abnormal findings: Secondary | ICD-10-CM | POA: Diagnosis not present

## 2023-10-14 NOTE — Progress Notes (Signed)
 Subjective:   Jasmine James is a 78 y.o. female who presents for Medicare Annual (Subsequent) preventive examination.  Visit Complete: In person at [facility]   Cardiac Risk Factors include: advanced age (>35men, >11 women);diabetes mellitus;dyslipidemia     Objective:    Today's Vitals   10/14/23 1416  BP: 132/78  Pulse: 100  Resp: 18  Temp: (!) 96.6 F (35.9 C)  SpO2: 93%  Weight: 147 lb 9.6 oz (67 kg)  Height: 5' 1.5 (1.562 m)  PainSc: 0-No pain   Body mass index is 27.44 kg/m.     10/14/2023    8:39 AM 08/22/2023    9:04 PM 05/09/2023    1:17 PM 11/26/2022   10:13 AM 10/12/2022    8:45 AM 04/20/2022    2:00 PM 10/03/2021    9:03 AM  Advanced Directives  Does Patient Have a Medical Advance Directive? Yes No Yes Yes Yes Yes Yes  Type of Advance Directive Living will  Living will Living will Living will Living will Living will  Does patient want to make changes to medical advance directive? No - Patient declined   No - Patient declined No - Patient declined No - Patient declined No - Patient declined  Would patient like information on creating a medical advance directive?   No - Patient declined        Current Medications (verified) Outpatient Encounter Medications as of 10/14/2023  Medication Sig   acetaminophen (TYLENOL) 650 MG CR tablet Take 1,300 mg by mouth daily as needed for pain.   Blood Glucose Monitoring Suppl (ACCU-CHEK AVIVA) device 1 each by Other route daily. Check blood sugar once daily   Cholecalciferol (D3 5000 PO) Take 2 capsules by mouth daily.   glucose blood (ACCU-CHEK AVIVA PLUS) test strip 1 each by Other route daily.   Lancets (ACCU-CHEK SOFT TOUCH) lancets 1 each by Other route daily.   metFORMIN  (GLUCOPHAGE ) 500 MG tablet TAKE 1/2 TABLET(250 MG) BY MOUTH DAILY WITH BREAKFAST   Polyethyl Glycol-Propyl Glycol (SYSTANE) 0.4-0.3 % SOLN Apply 1 drop to eye daily.   simvastatin  (ZOCOR ) 40 MG tablet Take 1 tablet (40 mg total) by mouth at bedtime.    meclizine  (ANTIVERT ) 12.5 MG tablet Take 1 tablet (12.5 mg total) by mouth 3 (three) times daily as needed for dizziness. (Patient not taking: Reported on 10/14/2023)   No facility-administered encounter medications on file as of 10/14/2023.    Allergies (verified) Codeine and Sulfa antibiotics   History: Past Medical History:  Diagnosis Date   Acute bronchitis    Anxiety    Disorder of bone and cartilage, unspecified    Elevated blood pressure reading without diagnosis of hypertension    Myalgia and myositis, unspecified    Osteoporosis, unspecified    Other abnormal blood chemistry    Other and unspecified hyperlipidemia    Plantar fascial fibromatosis    Type II or unspecified type diabetes mellitus without mention of complication, not stated as uncontrolled    Past Surgical History:  Procedure Laterality Date   BREAST SURGERY  04/17/1995   reduction   CATARACT EXTRACTION Right 04/16/2013   CATARACT EXTRACTION Left 04/16/2014   EYE SURGERY     Left eye contracts removed   EYE SURGERY Right 09/2021   Family History  Problem Relation Age of Onset   Dementia Mother    Memory loss Sister    Dementia Sister    Social History   Socioeconomic History   Marital status: Married  Spouse name: Not on file   Number of children: Not on file   Years of education: Not on file   Highest education level: Not on file  Occupational History   Not on file  Tobacco Use   Smoking status: Never   Smokeless tobacco: Never  Vaping Use   Vaping status: Never Used  Substance and Sexual Activity   Alcohol use: Yes    Comment: Occasionally   Drug use: No   Sexual activity: Not Currently  Other Topics Concern   Not on file  Social History Narrative   Not on file   Social Drivers of Health   Financial Resource Strain: Low Risk  (09/13/2017)   Overall Financial Resource Strain (CARDIA)    Difficulty of Paying Living Expenses: Not hard at all  Food Insecurity: No Food Insecurity  (09/13/2017)   Hunger Vital Sign    Worried About Running Out of Food in the Last Year: Never true    Ran Out of Food in the Last Year: Never true  Transportation Needs: No Transportation Needs (09/13/2017)   PRAPARE - Administrator, Civil Service (Medical): No    Lack of Transportation (Non-Medical): No  Physical Activity: Sufficiently Active (09/13/2017)   Exercise Vital Sign    Days of Exercise per Week: 7 days    Minutes of Exercise per Session: 60 min  Stress: No Stress Concern Present (09/13/2017)   [employer] of Occupational Health - Occupational Stress Questionnaire    Feeling of Stress : Not at all  Social Connections: Socially Integrated (09/13/2017)   Social Connection and Isolation Panel    Frequency of Communication with Friends and Family: More than three times a week    Frequency of Social Gatherings with Friends and Family: More than three times a week    Attends Religious Services: More than 4 times per year    Active Member of [employer] or Organizations: Yes    Attends Engineer, structural: More than 4 times per year    Marital Status: Married    Tobacco Counseling Counseling given: Not Answered   Clinical Intake:     Pain : No/denies pain Pain Score: 0-No pain     BMI - recorded: 27 Nutritional Status: BMI 25 -29 Overweight Diabetes: Yes  How often do you need to have someone help you when you read instructions, pamphlets, or other written materials from your doctor or pharmacy?: 1 - Never         Activities of Daily Living    10/14/2023    8:37 AM  In your present state of health, do you have any difficulty performing the following activities:  Hearing? 0  Vision? 0  Difficulty concentrating or making decisions? 0  Walking or climbing stairs? 0  Dressing or bathing? 0  Doing errands, shopping? 0  Preparing Food and eating ? N  Using the Toilet? N  In the past six months, have you accidently leaked urine? N  Do you  have problems with loss of bowel control? N  Managing your Medications? N  Managing your Finances? N  Housekeeping or managing your Housekeeping? N    Patient Care Team: Caro Harlene POUR, NP as PCP - General (Geriatric Medicine) Cleotilde Elspeth CROME, OD (Optometry) Cleotilde Sewer, OD (Optometry)  Indicate any recent Medical Services you may have received from other than [facility] providers in the past year (date may be approximate).     Assessment:   This is a routine  wellness examination for [facility].  Hearing/Vision screen No results found.   Goals Addressed   None    Depression Screen    10/14/2023    8:39 AM 05/09/2023    1:06 PM 11/26/2022   10:20 AM 10/12/2022    9:03 AM 04/20/2022    2:00 PM 10/03/2021    9:04 AM 06/02/2021    9:33 AM  PHQ 2/9 Scores  PHQ - 2 Score 0 0 0 0 0 0 0    Fall Risk    10/14/2023    8:39 AM 05/09/2023    1:06 PM 11/26/2022   10:20 AM 10/12/2022    9:03 AM 04/20/2022    2:00 PM  Fall Risk   Falls in the past year? 0 0 0 0 0  Number falls in past yr: 0 0 0 0 0  Injury with Fall? 0  0 0 0  Risk for fall due to :  No Fall Risks No Fall Risks No Fall Risks No Fall Risks  Follow up Falls evaluation completed Falls evaluation completed Falls evaluation completed Falls evaluation completed Falls evaluation completed      Data saved with a previous flowsheet row definition    MEDICARE RISK AT HOME: Medicare Risk at Home Any stairs in or around the home?: Yes If so, are there any without handrails?: No Home free of loose throw rugs in walkways, pet beds, electrical cords, etc?: Yes Adequate lighting in your home to reduce risk of falls?: Yes Life alert?: No Use of a cane, walker or w/c?: No Grab bars in the bathroom?: Yes Shower chair or bench in shower?: No Elevated toilet seat or a handicapped toilet?: No  TIMED UP AND GO:  Was the test performed?  No    Cognitive Function:    10/12/2022    9:10 AM 09/13/2017    8:48 AM 08/16/2016    9:57 AM   MMSE - Mini Mental State Exam  Orientation to time 5 4 5    Orientation to Place 5 4 5    Registration 3 3 3    Attention/ Calculation 5 5 5    Recall 2 2 2    Language- name 2 objects 2 2 2    Language- repeat 1 1 1   Language- follow 3 step command 3 3 3    Language- read & follow direction 1 1 1    Write a sentence 1 1 1    Copy design 0 1 1   Total score 28 27 29       Data saved with a previous flowsheet row definition        10/14/2023    8:40 AM 10/03/2021    9:04 AM 09/29/2020    8:45 AM 09/24/2019    8:59 AM 09/19/2018    4:47 PM  6CIT Screen  What Year? 0 points 0 points 0 points 0 points 0 points  What month? 0 points 0 points 0 points 0 points 0 points  What time? 0 points 0 points 0 points 0 points 0 points  Count back from 20 0 points 0 points 0 points 0 points 0 points  Months in reverse 0 points 0 points 0 points 0 points 0 points  Repeat phrase 0 points 0 points 0 points 0 points 0 points  Total Score 0 points 0 points 0 points 0 points 0 points    Immunizations Immunization History  Administered Date(s) Administered   Fluad Quad(high Dose 65+) 01/27/2021   Fluad Trivalent(High Dose 65+) 05/09/2023   Influenza,  High Dose Seasonal PF 01/29/2019   Influenza, Quadrivalent, Recombinant, Inj, Pf 02/13/2018   Influenza-Unspecified 02/14/2015   PFIZER(Purple Top)SARS-COV-2 Vaccination 05/29/2019, 06/23/2019   Pneumococcal Conjugate-13 10/22/2013   Pneumococcal Polysaccharide-23 08/16/2016, 03/20/2019   Td 04/24/2009   Tdap 10/07/2016   Zoster Recombinant(Shingrix) 10/19/2017, 01/17/2018    TDAP status: Up to date  Flu Vaccine status: Up to date  Pneumococcal vaccine status: Up to date  Covid-19 vaccine status: Information provided on how to obtain vaccines.   Qualifies for Shingles Vaccine? Yes   Zostavax completed No   Shingrix Completed?: Yes  Screening Tests Health Maintenance  Topic Date Due   INFLUENZA VACCINE  11/15/2023   HEMOGLOBIN A1C  11/25/2023    FOOT EXAM  12/11/2023   Diabetic kidney evaluation - Urine ACR  05/30/2024   Diabetic kidney evaluation - eGFR measurement  08/21/2024   OPHTHALMOLOGY EXAM  10/08/2024   Medicare Annual Wellness (AWV)  10/13/2024   DEXA SCAN  06/17/2025   DTaP/Tdap/Td (3 - Td or Tdap) 10/08/2026   Pneumococcal Vaccine: 50+ Years  Completed   Hepatitis C Screening  Completed   Zoster Vaccines- Shingrix  Completed   Hepatitis B Vaccines  Aged Out   HPV VACCINES  Aged Out   Meningococcal B Vaccine  Aged Out   Colonoscopy  Discontinued   COVID-19 Vaccine  Discontinued    Health Maintenance  There are no preventive care reminders to display for this patient.   Colorectal cancer screening: No longer required.   Mammogram status: No longer required due to age.  Bone Density status: Completed 06/2023. Results reflect: Bone density results: OSTEOPOROSIS. Repeat every 2 years.  Lung Cancer Screening: (Low Dose CT Chest recommended if Age 36-80 years, 20 pack-year currently smoking OR have quit w/in 15years.) does not qualify.   Lung Cancer Screening Referral: na  Additional Screening:  Hepatitis C Screening: does qualify; Completed   Vision Screening: Recommended annual ophthalmology exams for early detection of glaucoma and other disorders of the eye. Is the patient up to date with their annual eye exam?  Yes  Who is the provider or what is the name of the office in which the patient attends annual eye exams? Dr Cleotilde If pt is not established with a provider, would they like to be referred to a provider to establish care? No .   Dental Screening: Recommended annual dental exams for proper oral hygiene  Diabetic Foot Exam: Diabetic Foot Exam: Completed 12/11/2022  Community Resource Referral / Chronic Care Management: CRR required this visit?  No   CCM required this visit?  No     Plan:     I have personally reviewed and noted the following in the patient's chart:   Medical and social  history Use of alcohol, tobacco or illicit drugs  Current medications and supplements including opioid prescriptions. Patient is not currently taking opioid prescriptions. Functional ability and status Nutritional status Physical activity Advanced directives List of other physicians Hospitalizations, surgeries, and ER visits in previous 12 months Vitals Screenings to include cognitive, depression, and falls Referrals and appointments  In addition, I have reviewed and discussed with patient certain preventive protocols, quality metrics, and best practice recommendations. A written personalized care plan for preventive services as well as general preventive health recommendations were provided to patient.     Harlene MARLA An, NP   10/14/2023

## 2023-10-14 NOTE — Patient Instructions (Signed)
  Jasmine James , Thank you for taking time to come for your Medicare Wellness Visit. I appreciate your ongoing commitment to your health goals. Please review the following plan we discussed and let me know if I can assist you in the future.     This is a list of the screening recommended for you and due dates:  Health Maintenance  Topic Date Due   Flu Shot  11/15/2023   Hemoglobin A1C  11/25/2023   Complete foot exam   12/11/2023   Yearly kidney health urinalysis for diabetes  05/30/2024   Yearly kidney function blood test for diabetes  08/21/2024   Eye exam for diabetics  10/08/2024   Medicare Annual Wellness Visit  10/13/2024   DEXA scan (bone density measurement)  06/17/2025   DTaP/Tdap/Td vaccine (3 - Td or Tdap) 10/08/2026   Pneumococcal Vaccine for age over 7  Completed   Hepatitis C Screening  Completed   Zoster (Shingles) Vaccine  Completed   Hepatitis B Vaccine  Aged Out   HPV Vaccine  Aged Out   Meningitis B Vaccine  Aged Out   Colon Cancer Screening  Discontinued   COVID-19 Vaccine  Discontinued

## 2023-10-14 NOTE — Progress Notes (Signed)
 This service is provided via telemedicine  No vital signs collected/recorded due to the encounter was a telemedicine visit.   Location of patient (ex: home, work):  Home   Patient consents to a telephone visit:  Yes, see telephone encounter dated   Location of the provider (ex: office, home):  [facility]  Name of any referring provider:  Caro Harlene POUR, NP   Names of all persons participating in the telemedicine service and their role in the encounter: Celie Desrochers B/CMA, Caro Harlene POUR, NP  and patient  Time spent on call:  11 minutes

## 2023-10-14 NOTE — Progress Notes (Signed)
 I connected with  Jasmine James on 10/14/23 by a video enabled telemedicine application and verified that I am speaking with the correct person using two identifiers.   I discussed the limitations of evaluation and management by telemedicine. The patient expressed understanding and agreed to proceed.

## 2023-11-28 ENCOUNTER — Other Ambulatory Visit: Payer: PPO

## 2023-11-28 DIAGNOSIS — E785 Hyperlipidemia, unspecified: Secondary | ICD-10-CM | POA: Diagnosis not present

## 2023-11-28 DIAGNOSIS — E118 Type 2 diabetes mellitus with unspecified complications: Secondary | ICD-10-CM | POA: Diagnosis not present

## 2023-11-29 LAB — CBC WITH DIFFERENTIAL/PLATELET
Absolute Lymphocytes: 1987 {cells}/uL (ref 850–3900)
Absolute Monocytes: 595 {cells}/uL (ref 200–950)
Basophils Absolute: 48 {cells}/uL (ref 0–200)
Basophils Relative: 1 %
Eosinophils Absolute: 158 {cells}/uL (ref 15–500)
Eosinophils Relative: 3.3 %
HCT: 41.3 % (ref 35.0–45.0)
Hemoglobin: 13.6 g/dL (ref 11.7–15.5)
MCH: 29.6 pg (ref 27.0–33.0)
MCHC: 32.9 g/dL (ref 32.0–36.0)
MCV: 90 fL (ref 80.0–100.0)
MPV: 9.6 fL (ref 7.5–12.5)
Monocytes Relative: 12.4 %
Neutro Abs: 2011 {cells}/uL (ref 1500–7800)
Neutrophils Relative %: 41.9 %
Platelets: 252 Thousand/uL (ref 140–400)
RBC: 4.59 Million/uL (ref 3.80–5.10)
RDW: 13 % (ref 11.0–15.0)
Total Lymphocyte: 41.4 %
WBC: 4.8 Thousand/uL (ref 3.8–10.8)

## 2023-11-29 LAB — COMPLETE METABOLIC PANEL WITHOUT GFR
AG Ratio: 1.7 (calc) (ref 1.0–2.5)
ALT: 27 U/L (ref 6–29)
AST: 25 U/L (ref 10–35)
Albumin: 4.6 g/dL (ref 3.6–5.1)
Alkaline phosphatase (APISO): 75 U/L (ref 37–153)
BUN: 16 mg/dL (ref 7–25)
CO2: 26 mmol/L (ref 20–32)
Calcium: 9.6 mg/dL (ref 8.6–10.4)
Chloride: 104 mmol/L (ref 98–110)
Creat: 0.61 mg/dL (ref 0.60–1.00)
Globulin: 2.7 g/dL (ref 1.9–3.7)
Glucose, Bld: 104 mg/dL — ABNORMAL HIGH (ref 65–99)
Potassium: 4 mmol/L (ref 3.5–5.3)
Sodium: 140 mmol/L (ref 135–146)
Total Bilirubin: 0.6 mg/dL (ref 0.2–1.2)
Total Protein: 7.3 g/dL (ref 6.1–8.1)

## 2023-11-29 LAB — HEMOGLOBIN A1C
Hgb A1c MFr Bld: 6.1 % — ABNORMAL HIGH (ref <5.7)
Mean Plasma Glucose: 128 mg/dL
eAG (mmol/L): 7.1 mmol/L

## 2023-12-02 ENCOUNTER — Ambulatory Visit (INDEPENDENT_AMBULATORY_CARE_PROVIDER_SITE_OTHER): Payer: PPO | Admitting: Nurse Practitioner

## 2023-12-02 ENCOUNTER — Encounter: Payer: Self-pay | Admitting: Nurse Practitioner

## 2023-12-02 VITALS — BP 138/88 | HR 80 | Temp 97.6°F | Ht 61.5 in | Wt 149.4 lb

## 2023-12-02 DIAGNOSIS — M810 Age-related osteoporosis without current pathological fracture: Secondary | ICD-10-CM | POA: Diagnosis not present

## 2023-12-02 DIAGNOSIS — E118 Type 2 diabetes mellitus with unspecified complications: Secondary | ICD-10-CM

## 2023-12-02 DIAGNOSIS — M1710 Unilateral primary osteoarthritis, unspecified knee: Secondary | ICD-10-CM | POA: Diagnosis not present

## 2023-12-02 DIAGNOSIS — I10 Essential (primary) hypertension: Secondary | ICD-10-CM

## 2023-12-02 DIAGNOSIS — E785 Hyperlipidemia, unspecified: Secondary | ICD-10-CM

## 2023-12-02 NOTE — Assessment & Plan Note (Signed)
 Osteoporosis with concerns about medication side effects. Emphasized fracture prevention over potential medication side effects. Discussed limited benefit of prescription strength vitamin D  over OTC. Advised consideration of medication options. - Continue weight-bearing exercises such as walking. - Take calcium 600 mg twice daily with meals. - Take vitamin D  2000 units daily. - Consider osteoporosis medication options and discuss with [facility] or here.  - Repeat bone density scan in two years.

## 2023-12-02 NOTE — Progress Notes (Signed)
 Careteam: Patient Care Team: Caro Harlene POUR, NP as PCP - General (Geriatric Medicine) Cleotilde Elspeth CROME, OD (Optometry) Cleotilde Sewer, OD (Optometry)  PLACE OF SERVICE:  [facility]  Advanced Directive information    Allergies  Allergen Reactions   Codeine    Sulfa Antibiotics     Chief Complaint  Patient presents with   Medical Management of Chronic Issues    6 month follow-up. Discuss recommended dose of Vit C. Discuss options for bone thinning.     HPI:  Discussed the use of AI scribe software for clinical note transcription with the patient, who gave verbal consent to proceed.  History of Present Illness Jasmine James is a 78 year old female with osteoporosis who presents for a six-month follow-up visit.  She is concerned about osteoporosis management. She previously used Fosamax in the 1980s, which caused her to feel 'real sweaty' and limited her ability to bend over or squat. She is reluctant to use Prolia due to concerns about its side effects, which she describes as 'a mile long.' She inquires about the use of prescription-strength vitamin D  as an alternative, noting past use. She is currently engaging in weight-bearing exercises, including walking more frequently than before. She takes 2000 units of vitamin D  daily and 600 mg of calcium with breakfast and supper.  Her recent blood work shows an A1c of 6.1, down from 6.2. Her fasting glucose is 104. She reports no issues with her current medication, metformin , and denies experiencing low blood sugars or feeling shaky.  She mentions her family history, noting that her baby sister, who recently retired at 68, has developed AFib and a liver spot, and her other sister, aged 46, has dementia.   No pain in the back or knees, palpitations, or shortness of breath. She reports occasional knee pain but no other significant issues.    Review of Systems:  Review of Systems  Constitutional:  Negative for chills, fever and  weight loss.  HENT:  Negative for tinnitus.   Respiratory:  Negative for cough, sputum production and shortness of breath.   Cardiovascular:  Negative for chest pain, palpitations and leg swelling.  Gastrointestinal:  Negative for abdominal pain, constipation, diarrhea and heartburn.  Genitourinary:  Negative for dysuria, frequency and urgency.  Musculoskeletal:  Negative for back pain, falls, joint pain and myalgias.  Skin: Negative.   Neurological:  Negative for dizziness and headaches.  Psychiatric/Behavioral:  Negative for depression and memory loss. The patient does not have insomnia.     Past Medical History:  Diagnosis Date   Acute bronchitis    Anxiety    Disorder of bone and cartilage, unspecified    Elevated blood pressure reading without diagnosis of hypertension    Myalgia and myositis, unspecified    Osteoporosis, unspecified    Other abnormal blood chemistry    Other and unspecified hyperlipidemia    Plantar fascial fibromatosis    Type II or unspecified type diabetes mellitus without mention of complication, not stated as uncontrolled    Past Surgical History:  Procedure Laterality Date   BREAST SURGERY  04/17/1995   reduction   CATARACT EXTRACTION Right 04/16/2013   CATARACT EXTRACTION Left 04/16/2014   EYE SURGERY     Left eye contracts removed   EYE SURGERY Right 09/2021   Social History:   reports that she has never smoked. She has never used smokeless tobacco. She reports current alcohol use. She reports that she does not use drugs.  Family History  Problem Relation Age of Onset   Dementia Mother    Memory loss Sister    Dementia Sister     Medications: Patient's Medications  New Prescriptions   No medications on file  Previous Medications   ACETAMINOPHEN (TYLENOL) 650 MG CR TABLET    Take 1,300 mg by mouth daily as needed for pain.   ASCORBIC ACID (VITAMIN C ) 1000 MG TABLET    Take 2,000 mg by mouth daily.   BLOOD GLUCOSE MONITORING SUPPL  (ACCU-CHEK AVIVA) DEVICE    1 each by Other route daily. Check blood sugar once daily   CHOLECALCIFEROL (D3 5000 PO)    Take 2 capsules by mouth daily.   GLUCOSE BLOOD (ACCU-CHEK AVIVA PLUS) TEST STRIP    1 each by Other route daily.   LANCETS (ACCU-CHEK SOFT TOUCH) LANCETS    1 each by Other route daily.   MECLIZINE  (ANTIVERT ) 12.5 MG TABLET    Take 1 tablet (12.5 mg total) by mouth 3 (three) times daily as needed for dizziness.   METFORMIN  (GLUCOPHAGE ) 500 MG TABLET    TAKE 1/2 TABLET(250 MG) BY MOUTH DAILY WITH BREAKFAST   POLYETHYL GLYCOL-PROPYL GLYCOL (SYSTANE) 0.4-0.3 % SOLN    Apply 1 drop to eye daily.   SIMVASTATIN  (ZOCOR ) 40 MG TABLET    Take 1 tablet (40 mg total) by mouth at bedtime.  Modified Medications   No medications on file  Discontinued Medications   No medications on file    Physical Exam:  Vitals:   12/02/23 0827  BP: 138/88  Pulse: 80  Temp: 97.6 F (36.4 C)  TempSrc: Temporal  SpO2: 97%  Weight: 149 lb 6.4 oz (67.8 kg)  Height: 5' 1.5 (1.562 m)   Body mass index is 27.77 kg/m. Wt Readings from Last 3 Encounters:  12/02/23 149 lb 6.4 oz (67.8 kg)  10/14/23 147 lb 9.6 oz (67 kg)  05/31/23 154 lb 3.2 oz (69.9 kg)    Physical Exam Constitutional:      General: She is not in acute distress.    Appearance: She is well-developed. She is not diaphoretic.  HENT:     Head: Normocephalic and atraumatic.     Mouth/Throat:     Pharynx: No oropharyngeal exudate.  Eyes:     Conjunctiva/sclera: Conjunctivae normal.     Pupils: Pupils are equal, round, and reactive to light.  Cardiovascular:     Rate and Rhythm: Normal rate and regular rhythm.     Heart sounds: Normal heart sounds.  Pulmonary:     Effort: Pulmonary effort is normal.     Breath sounds: Normal breath sounds.  Abdominal:     General: Bowel sounds are normal.     Palpations: Abdomen is soft.  Musculoskeletal:     Cervical back: Normal range of motion and neck supple.     Right lower leg: No  edema.     Left lower leg: No edema.  Skin:    General: Skin is warm and dry.  Neurological:     Mental Status: She is alert.  Psychiatric:        Mood and Affect: Mood normal.     Labs reviewed: Basic Metabolic Panel: Recent Labs    05/28/23 0805 08/22/23 1429 11/28/23 0805  NA 139 141 140  K 4.0 3.9 4.0  CL 103 107 104  CO2 27 23 26   GLUCOSE 96 118* 104*  BUN 14 13 16   CREATININE 0.77 0.75 0.61  CALCIUM 9.7  9.4 9.6   Liver Function Tests: Recent Labs    05/28/23 0805 08/22/23 1429 11/28/23 0805  AST 27 35 25  ALT 27 30 27   ALKPHOS  --  68  --   BILITOT 0.7 0.6 0.6  PROT 7.5 7.3 7.3  ALBUMIN  --  4.2  --    No results for input(s): LIPASE, AMYLASE in the last 8760 hours. No results for input(s): AMMONIA in the last 8760 hours. CBC: Recent Labs    05/28/23 0805 08/22/23 1429 11/28/23 0805  WBC 4.8 5.5 4.8  NEUTROABS 2,107  --  2,011  HGB 13.6 13.4 13.6  HCT 42.3 41.7 41.3  MCV 88.1 90.3 90.0  PLT 277 265 252   Lipid Panel: Recent Labs    05/28/23 0805  CHOL 136  HDL 40*  LDLCALC 76  TRIG 885  CHOLHDL 3.4   TSH: No results for input(s): TSH in the last 8760 hours. A1C: Lab Results  Component Value Date   HGBA1C 6.1 (H) 11/28/2023     Assessment/Plan  Essential hypertension, benign Assessment & Plan: Blood pressure well controlled, goal bp <140/90 not on medication Discussed use of ace/arb due to DM but declines Continue dietary modifications follow metabolic panel   Controlled type 2 diabetes mellitus with complication, without long-term current use of insulin (HCC) Assessment & Plan: Continues metformin , encouraged dietary compliance, routine foot care/monitoring and to keep up with diabetic eye exams through ophthalmology    Hyperlipidemia LDL goal <70 Assessment & Plan: LDL at goal, continues on zocor  daily with dietary modifications.   Osteoporosis, unspecified osteoporosis type, unspecified pathological fracture  presence Assessment & Plan: Osteoporosis with concerns about medication side effects. Emphasized fracture prevention over potential medication side effects. Discussed limited benefit of prescription strength vitamin D  over OTC. Advised consideration of medication options. - Continue weight-bearing exercises such as walking. - Take calcium 600 mg twice daily with meals. - Take vitamin D  2000 units daily. - Consider osteoporosis medication options and discuss with [facility] or here.  - Repeat bone density scan in two years.   Primary osteoarthritis of knee, unspecified laterality Assessment & Plan: Stable at this time, advised to avoid NSAID use Tylenol 325 mg 2 tablet every 6 hours as needed pain      Return in about 6 months (around 06/03/2024) for routine follow up, labs prior to visit.:  Damoney Julia K. Caro BODILY [facility] [phone]

## 2023-12-02 NOTE — Assessment & Plan Note (Signed)
 Blood pressure well controlled, goal bp <140/90 not on medication Discussed use of ace/arb due to DM but declines Continue dietary modifications follow metabolic panel

## 2023-12-02 NOTE — Assessment & Plan Note (Signed)
 Stable at this time, advised to avoid NSAID use Tylenol 325 mg 2 tablet every 6 hours as needed pain

## 2023-12-02 NOTE — Assessment & Plan Note (Signed)
 LDL at goal, continues on zocor  daily with dietary modifications.

## 2023-12-02 NOTE — Patient Instructions (Signed)
Recommended to take calcium 600 mg twice daily with Vitamin D 2000 units daily and weight bearing activity 30 mins/5 days a week   

## 2023-12-02 NOTE — Assessment & Plan Note (Signed)
 Continues metformin , encouraged dietary compliance, routine foot care/monitoring and to keep up with diabetic eye exams through ophthalmology

## 2023-12-10 ENCOUNTER — Other Ambulatory Visit: Payer: Self-pay | Admitting: Nurse Practitioner

## 2023-12-10 DIAGNOSIS — E785 Hyperlipidemia, unspecified: Secondary | ICD-10-CM

## 2024-02-14 ENCOUNTER — Other Ambulatory Visit: Payer: Self-pay | Admitting: Nurse Practitioner

## 2024-02-14 DIAGNOSIS — E119 Type 2 diabetes mellitus without complications: Secondary | ICD-10-CM

## 2024-02-19 ENCOUNTER — Ambulatory Visit

## 2024-02-19 ENCOUNTER — Encounter: Payer: Self-pay | Admitting: Podiatry

## 2024-02-19 DIAGNOSIS — L600 Ingrowing nail: Secondary | ICD-10-CM | POA: Diagnosis not present

## 2024-02-19 NOTE — Patient Instructions (Signed)

## 2024-02-19 NOTE — Progress Notes (Signed)
 Patient complains of painful ingrown lateral border(s) hallux left. Patient denies fevers, chills, nausea, vomiting.  Objective:  Vitals: Reviewed  General: Well developed, nourished, in no acute distress, alert and oriented x3   Vascular: DP pulse 2/4 bilateral. PT pulse 2/4 bilateral.  Minimal edema lower extremity bilaterally.  Capillary refill time immediate bilaterally  Dermatology: Erythema, edema, incurvated nail border lateral hallux left with slight clear drainage . Tenderness present with palpation. Normal skin tone and texture feet with normal hair growth.  Neurological: Grossly intact. Normal reflexes.   Musculoskeletal: Tenderness with palpation of the distal hallux left. No tenderness or painful ROM at IPJ.  Second toe overlaps hallux due to instability at the second metatarsal phalangeal joint bilaterally  Diagnosis: Ingrown nail lateral border hallux left  Plan: -discussed etiology and treatment of ingrown nails. Discussed surgical vs conservative treatment. -Consent signed for appropriate matrixectomy affected nail(s).   Procedure(s):   - Matrixectomy(s) lateral border hallux left: Toe anesthetized with 3cc 2:1 mixture 2% Lidocaine  with epinephrine: Sodium Bicarbonate. Surgical site prepped. Digital tourniquet applied.  Avulsion of nail border. performed. Matrixecomy performed with three 30 second applications of phenol to nail matrix. Site irrigated with alcohol.  Tourniquet released with good vascularity noticed in digit.  Applied triple antibiotic to nailbed and applied gauze and Coban dressing. - Written and oral postoperative instructions given. -Dispensed digital splints for the second toe bilaterally  -Return for post-op 2 weeks.  JINNY Prentice Binder, DPM

## 2024-03-04 ENCOUNTER — Ambulatory Visit: Admitting: Podiatry

## 2024-03-04 DIAGNOSIS — L600 Ingrowing nail: Secondary | ICD-10-CM

## 2024-03-04 NOTE — Progress Notes (Signed)
 Patient presents follow-up nail surgery toe.  No complaints.  Physical exam:  Dermatologic: Nail surgery site for matrixectomy healing well with no signs of infection.  Diagnosis: 1.  Status post matrixectomy toe.  Healing well  Plan: - POV status post nail surgery , if patient has any problems she can call for appointment otherwise we can see her as needed  - Return as needed

## 2024-04-15 ENCOUNTER — Encounter: Payer: Self-pay | Admitting: Pharmacist

## 2024-04-15 NOTE — Progress Notes (Signed)
" ° °  04/15/2024 Name: Jasmine James MRN: [id number] DOB: [birth year]  Chief Complaint  Patient presents with   Medication Adherence    Simvastatin    Pharmacy Quality Measure Review  This patient is appearing on a report for being at risk of failing the adherence measure for cholesterol (statin) medications this calendar year.   Medication: Simvastatin   Last fill date: 03/29/24 for 90 day supply  Insurance report was not up to date. No action needed at this time.    Cassius DOROTHA Brought, PharmD, BCACP Clinical Pharmacist [phone]   "

## 2024-05-25 ENCOUNTER — Other Ambulatory Visit: Payer: Self-pay

## 2024-05-27 ENCOUNTER — Other Ambulatory Visit

## 2024-06-01 ENCOUNTER — Ambulatory Visit: Payer: Self-pay | Admitting: Nurse Practitioner

## 2024-06-05 ENCOUNTER — Ambulatory Visit: Admitting: Nurse Practitioner

## 2024-06-23 ENCOUNTER — Ambulatory Visit: Admitting: Podiatry

## 2024-10-19 ENCOUNTER — Encounter: Payer: Self-pay | Admitting: Nurse Practitioner
